# Patient Record
Sex: Female | Born: 1980 | Hispanic: No | Marital: Married | Smoking: Former smoker
Health system: Southern US, Community
[De-identification: ages and names within clinical notes are randomized; demographics above are authoritative.]

## PROBLEM LIST (undated history)

## (undated) DIAGNOSIS — K859 Acute pancreatitis without necrosis or infection, unspecified: Secondary | ICD-10-CM

## (undated) DIAGNOSIS — F1010 Alcohol abuse, uncomplicated: Secondary | ICD-10-CM

## (undated) DIAGNOSIS — I509 Heart failure, unspecified: Secondary | ICD-10-CM

## (undated) DIAGNOSIS — I429 Cardiomyopathy, unspecified: Secondary | ICD-10-CM

## (undated) DIAGNOSIS — K2970 Gastritis, unspecified, without bleeding: Secondary | ICD-10-CM

## (undated) DIAGNOSIS — K219 Gastro-esophageal reflux disease without esophagitis: Secondary | ICD-10-CM

## (undated) HISTORY — DX: Acute pancreatitis without necrosis or infection, unspecified: K85.90

## (undated) HISTORY — DX: Cardiomyopathy, unspecified: I42.9

## (undated) HISTORY — DX: Heart failure, unspecified: I50.9

## (undated) HISTORY — PX: WISDOM TOOTH EXTRACTION: SHX21

## (undated) HISTORY — DX: Gastro-esophageal reflux disease without esophagitis: K21.9

---

## 2008-04-27 ENCOUNTER — Other Ambulatory Visit: Admission: RE | Admit: 2008-04-27 | Discharge: 2008-04-27 | Payer: Self-pay | Admitting: Gynecology

## 2009-09-06 ENCOUNTER — Emergency Department (HOSPITAL_COMMUNITY): Admission: EM | Admit: 2009-09-06 | Discharge: 2009-09-07 | Payer: Self-pay | Admitting: Emergency Medicine

## 2009-09-06 ENCOUNTER — Emergency Department (HOSPITAL_COMMUNITY): Admission: EM | Admit: 2009-09-06 | Discharge: 2009-09-06 | Payer: Self-pay | Admitting: Family Medicine

## 2010-07-25 ENCOUNTER — Encounter (HOSPITAL_COMMUNITY)
Admission: RE | Admit: 2010-07-25 | Discharge: 2010-07-25 | Disposition: A | Discharge status: Home / Self Care | Payer: 59 | Source: Ambulatory Visit | Attending: Obstetrics and Gynecology | Admitting: Obstetrics and Gynecology

## 2010-07-25 ENCOUNTER — Other Ambulatory Visit (HOSPITAL_COMMUNITY): Payer: 59

## 2010-07-25 DIAGNOSIS — Z01812 Encounter for preprocedural laboratory examination: Secondary | ICD-10-CM | POA: Insufficient documentation

## 2010-07-25 LAB — CBC
HCT: 41.9 % (ref 36.0–46.0)
Hemoglobin: 14.1 g/dL (ref 12.0–15.0)
MCH: 29.7 pg (ref 26.0–34.0)
MCV: 88.2 fL (ref 78.0–100.0)
RDW: 13.1 % (ref 11.5–15.5)

## 2010-08-01 ENCOUNTER — Other Ambulatory Visit (HOSPITAL_COMMUNITY): Payer: Self-pay | Admitting: Obstetrics and Gynecology

## 2010-08-01 ENCOUNTER — Ambulatory Visit (HOSPITAL_COMMUNITY)
Admission: RE | Admit: 2010-08-01 | Discharge: 2010-08-01 | Disposition: A | Discharge status: Home / Self Care | Payer: 59 | Source: Ambulatory Visit | Attending: Obstetrics and Gynecology | Admitting: Obstetrics and Gynecology

## 2010-08-01 DIAGNOSIS — N938 Other specified abnormal uterine and vaginal bleeding: Secondary | ICD-10-CM | POA: Insufficient documentation

## 2010-08-01 DIAGNOSIS — E282 Polycystic ovarian syndrome: Secondary | ICD-10-CM | POA: Insufficient documentation

## 2010-08-01 DIAGNOSIS — N840 Polyp of corpus uteri: Secondary | ICD-10-CM | POA: Insufficient documentation

## 2010-08-01 DIAGNOSIS — N949 Unspecified condition associated with female genital organs and menstrual cycle: Secondary | ICD-10-CM | POA: Insufficient documentation

## 2010-08-01 LAB — PREGNANCY, URINE: Preg Test, Ur: NEGATIVE

## 2010-08-01 LAB — ABO/RH: ABO/RH(D): O NEG

## 2010-08-07 NOTE — Op Note (Signed)
  Tina Colon, Tina Colon               ACCOUNT NO.:  [id number]  MEDICAL RECORD NO.:  [id number]           PATIENT TYPE:  O  LOCATION:  [facility]                          FACILITY[facility]  [facility]  PHYSICIAN:  Zelphia Cairo, MD    DATE OF BIRTH:  Aug 21, 1980  DATE OF PROCEDURE:  08/01/2010 DATE OF DISCHARGE:                              OPERATIVE REPORT   PREOPERATIVE DIAGNOSES: 1. Irregular vaginal bleeding. 2. Endometrial mass.  POSTOPERATIVE DIAGNOSES: 1. Irregular vaginal bleeding. 2. Endometrial mass, path pending.  PROCEDURES: 1. Cervical block. 2. Hysteroscopy. 3. D and C.  SURGEON:  Zelphia Cairo, MD  ANESTHESIA:  General with local.  SPECIMEN:  Endometrial curettings to Pathology.  FINDINGS:  Polypoid endometrial mass noted within the endometrium.  No other masses or abnormalities were noted.  Bilateral ostia were visualized and appeared normal.  COMPLICATIONS:  None.  CONDITION:  Stable and extubated to recovery room.PROCEDURE IN DETAIL:  [name] was taken to the operating room after informed consent was obtained.  She was given anesthesia and placed in the dorsal lithotomy position using Allen stirrups.  She was prepped and draped in sterile fashion, and an in-and-out catheter was used to drain her bladder sterilely.  Bivalve speculum was placed in the vagina and 1 mL of 1% lidocaine was injected at 12 o'clock position of the cervix. Single-tooth tenaculum was then placed on the anterior lip of the cervix and a paracervical block was performed using 9 mL of 1% lidocaine.  The cervix was then serially dilated using Pratt dilators.  Diagnostic hysteroscope was inserted into the endometrial cavity.  Polypoid mass was noted.  No other abnormalities were noted.  Bilateral ostia were visualized and appeared normal.  Hysteroscope was removed and a gentle curetting was performed.  Pathology specimen was placed on Telfa and passed off.  The diagnostic hysteroscope was reinserted.   No other masses or abnormalities were noted. Polypoid mass was no longer present.  The hysteroscope was then removed. Single-tooth tenaculum was removed.  The cervix was hemostatic.  The speculum was removed.  The patient was taken to the recovery room in stable condition.  Sponge, lap, needle, and instrument counts were correct x2.     Zelphia Cairo, MD     GA/MEDQ  D:  08/01/2010  T:  08/01/2010  Job:  [id number]  Electronically Signed by Zelphia Cairo MD on 08/07/2010 02:29:01 AM

## 2010-09-02 LAB — COMPREHENSIVE METABOLIC PANEL
Albumin: 4.6 g/dL (ref 3.5–5.2)
BUN: 8 mg/dL (ref 6–23)
Chloride: 105 mEq/L (ref 96–112)
GFR calc non Af Amer: >60 mL/min (ref >60)
Potassium: 3.5 mEq/L (ref 3.5–5.1)
Total Protein: 7.7 g/dL (ref 6.0–8.3)

## 2010-09-02 LAB — URINE MICROSCOPIC-ADD ON

## 2010-09-02 LAB — DIFFERENTIAL
Eosinophils Absolute: 0 10*3/uL (ref 0.0–0.7)
Lymphocytes Relative: 15 % (ref 12–46)
Monocytes Absolute: 0.2 10*3/uL (ref 0.1–1.0)
Monocytes Relative: 2 % — ABNORMAL LOW (ref 3–12)
Neutro Abs: 8 10*3/uL — ABNORMAL HIGH (ref 1.7–7.7)

## 2010-09-02 LAB — URINALYSIS, ROUTINE W REFLEX MICROSCOPIC
Bilirubin Urine: NEGATIVE
Hgb urine dipstick: NEGATIVE
Nitrite: NEGATIVE
Protein, ur: NEGATIVE mg/dL
Urobilinogen, UA: 0.2 mg/dL (ref 0.0–1.0)

## 2010-09-02 LAB — CBC
MCHC: 33.7 g/dL (ref 30.0–36.0)
MCV: 91.4 fL (ref 78.0–100.0)
RDW: 12.7 % (ref 11.5–15.5)
WBC: 9.7 10*3/uL (ref 4.0–10.5)

## 2010-09-02 LAB — LIPASE, BLOOD: Lipase: 23 U/L (ref 11–59)

## 2010-09-02 LAB — POCT I-STAT, CHEM 8
HCT: 42 % (ref 36.0–46.0)
TCO2: 27 mmol/L (ref 0–100)

## 2013-03-13 ENCOUNTER — Encounter (HOSPITAL_COMMUNITY): Payer: Self-pay | Admitting: *Deleted

## 2013-03-13 ENCOUNTER — Emergency Department (HOSPITAL_COMMUNITY)
Admission: EM | Admit: 2013-03-13 | Discharge: 2013-03-13 | Disposition: A | Discharge status: Home / Self Care | Payer: 59 | Attending: Emergency Medicine | Admitting: Emergency Medicine

## 2013-03-13 ENCOUNTER — Encounter (HOSPITAL_COMMUNITY): Payer: Self-pay | Admitting: Family Medicine

## 2013-03-13 ENCOUNTER — Emergency Department (INDEPENDENT_AMBULATORY_CARE_PROVIDER_SITE_OTHER)
Admission: EM | Admit: 2013-03-13 | Discharge: 2013-03-13 | Disposition: A | Discharge status: Nursing Home (Includes ICF) | Payer: Self-pay | Source: Home / Self Care | Attending: Emergency Medicine | Admitting: Emergency Medicine

## 2013-03-13 DIAGNOSIS — Z880 Allergy status to penicillin: Secondary | ICD-10-CM | POA: Insufficient documentation

## 2013-03-13 DIAGNOSIS — R112 Nausea with vomiting, unspecified: Secondary | ICD-10-CM | POA: Insufficient documentation

## 2013-03-13 DIAGNOSIS — F1021 Alcohol dependence, in remission: Secondary | ICD-10-CM | POA: Insufficient documentation

## 2013-03-13 DIAGNOSIS — Z3202 Encounter for pregnancy test, result negative: Secondary | ICD-10-CM | POA: Insufficient documentation

## 2013-03-13 DIAGNOSIS — E876 Hypokalemia: Secondary | ICD-10-CM

## 2013-03-13 DIAGNOSIS — F17200 Nicotine dependence, unspecified, uncomplicated: Secondary | ICD-10-CM | POA: Insufficient documentation

## 2013-03-13 DIAGNOSIS — E860 Dehydration: Secondary | ICD-10-CM

## 2013-03-13 HISTORY — DX: Alcohol abuse, uncomplicated: F10.10

## 2013-03-13 LAB — CBC WITH DIFFERENTIAL/PLATELET
Eosinophils Relative: 1 % (ref 0–5)
HCT: 40.2 % (ref 36.0–46.0)
Hemoglobin: 14.5 g/dL (ref 12.0–15.0)
MCH: 31.3 pg (ref 26.0–34.0)
MCHC: 36.1 g/dL — ABNORMAL HIGH (ref 30.0–36.0)
MCV: 86.6 fL (ref 78.0–100.0)
Monocytes Absolute: 0.6 10*3/uL (ref 0.1–1.0)
RBC: 4.64 MIL/uL (ref 3.87–5.11)
RDW: 13.3 % (ref 11.5–15.5)
WBC: 12.3 10*3/uL — ABNORMAL HIGH (ref 4.0–10.5)

## 2013-03-13 LAB — POCT I-STAT, CHEM 8
Calcium, Ion: 1.13 mmol/L (ref 1.12–1.23)
Chloride: 106 mEq/L (ref 96–112)
Potassium: 3.2 mEq/L — ABNORMAL LOW (ref 3.5–5.1)

## 2013-03-13 LAB — PREGNANCY, URINE: Preg Test, Ur: NEGATIVE

## 2013-03-13 MED ORDER — ONDANSETRON HCL 4 MG/2ML IJ SOLN
4.0000 mg | Freq: Once | INTRAMUSCULAR | Status: AC
  Administered 2013-03-13: 4 mg via INTRAVENOUS
  Filled 2013-03-13: qty 2

## 2013-03-13 MED ORDER — ONDANSETRON HCL 4 MG/2ML IJ SOLN
INTRAMUSCULAR | Status: AC
  Filled 2013-03-13: qty 2

## 2013-03-13 MED ORDER — KETOROLAC TROMETHAMINE 30 MG/ML IJ SOLN
30.0000 mg | Freq: Once | INTRAMUSCULAR | Status: AC
Start: 2013-03-13 — End: 2013-03-13
  Administered 2013-03-13: 30 mg via INTRAVENOUS
  Filled 2013-03-13: qty 1

## 2013-03-13 MED ORDER — PROMETHAZINE HCL 25 MG PO TABS: 25.0000 mg | ORAL_TABLET | Freq: Four times a day (QID) | ORAL | Status: DC | PRN

## 2013-03-13 MED ORDER — PROMETHAZINE HCL 25 MG/ML IJ SOLN
25.0000 mg | Freq: Once | INTRAMUSCULAR | Status: AC
  Administered 2013-03-13: 25 mg via INTRAVENOUS
  Filled 2013-03-13: qty 1

## 2013-03-13 MED ORDER — ONDANSETRON HCL 4 MG/2ML IJ SOLN: 4.0000 mg | Freq: Once | INTRAMUSCULAR | Status: DC

## 2013-03-13 MED ORDER — SODIUM CHLORIDE 0.9 % IV SOLN
Freq: Once | INTRAVENOUS | Status: AC
  Administered 2013-03-13: 17:00:00 via INTRAVENOUS

## 2013-03-13 MED ORDER — SODIUM CHLORIDE 0.9 % IV BOLUS (SEPSIS)
1000.0000 mL | Freq: Once | INTRAVENOUS | Status: AC
  Administered 2013-03-13: 1000 mL via INTRAVENOUS

## 2013-03-13 MED ORDER — ONDANSETRON 4 MG PO TBDP
4.0000 mg | ORAL_TABLET | Freq: Once | ORAL | Status: AC
  Administered 2013-03-13: 4 mg via ORAL
  Filled 2013-03-13: qty 1

## 2013-03-13 MED ORDER — ONDANSETRON HCL 4 MG/2ML IJ SOLN
4.0000 mg | Freq: Once | INTRAMUSCULAR | Status: AC
  Administered 2013-03-13: 4 mg via INTRAVENOUS

## 2013-03-13 NOTE — ED Notes (Signed)
Pt  Reports      Vomiting     abd  Earlier       No  Diarrhea        Symptoms  Began this  Am  Feels  like  Has  A  bm     But  Cannot    Pain  Started out  As  Cramping

## 2013-03-13 NOTE — ED Notes (Signed)
Bolus from urgent care complete.

## 2013-03-13 NOTE — ED Provider Notes (Signed)
Chief Complaint:   Chief Complaint  Patient presents with  . Emesis    History of Present Illness:   Tina Colon is a 32 year old female who presents with a history of one to 2 hours of nausea and vomiting. Her vomitus is yellow. She denies any hematemesis or coffee-ground emesis. No bilious emesis. She feels chilled but does not have a fever. She has some crampy lower bowel pain which she attributes to menstrual cramps. She denies any diarrhea or blood in the stool. She has had no history of GI problems including ulcers, gastritis, gallstones, or pancreatitis. She does not think she is pregnant. Her last menstrual period was mid September. She states she drank 1 and 1/2 beers last night, yet I can detect a faint odor of alcohol on her breath right now.   Review of Systems:  Other than noted above, the patient denies any of the following symptoms: Systemic:  No fevers, chills, sweats, weight loss or gain, fatigue, or tiredness. ENT:  No nasal congestion, rhinorrhea, or sore throat. Lungs:  No cough, wheezing, or shortness of breath. Cardiac:  No chest pain, syncope, or presyncope. GI:  No abdominal pain, nausea, vomiting, anorexia, diarrhea, constipation, blood in stool or vomitus. GU:  No dysuria, frequency, or urgency.  PMFSH:  Past medical history, family history, social history, meds, and allergies were reviewed.  She is allergic to penicillin.  Physical Exam:   Vital signs:  BP 122/75  Pulse 79  Temp(Src) 98.5 F (36.9 C) (Oral)  Resp 18  SpO2 100%  LMP 02/21/2013 Filed Vitals:   03/13/13 1555 03/13/13 1556 Supine  03/13/13 1559 Sitting  03/13/13 1601 Standing   BP: 144/78 146/67 153/89 122/75  Pulse: 55 55 61 79  Temp: 98.5 F (36.9 C)     TempSrc: Oral     Resp: 18     SpO2: 100%      General:  Alert and oriented.  She is in marked distress with constant retching and groaning.  Skin warm and dry.  Good skin turgor, brisk capillary refill. ENT:  No scleral icterus,  moist mucous membranes, no oral lesions, pharynx clear. Has mild odor of alcohol on her breath. Lungs:  Breath sounds clear and equal bilaterally.  No wheezes, rales, or rhonchi. Heart:  Rhythm regular, without extrasystoles.  No gallops or murmers. Abdomen:  Soft, flat, nondistended. She has mild, lower abdominal tenderness to palpation in both lower quadrants without guarding or rebound. No organomegaly or mass. Bowel sounds are not heard. Skin: Clear, warm, and dry.  Good turgor.  Brisk capillary refill.  Labs:   Results for orders placed during the hospital encounter of 03/13/13  CBC WITH DIFFERENTIAL      Result Value Range   WBC 12.3 (*) 4.0 - 10.5 K/uL   RBC 4.64  3.87 - 5.11 MIL/uL   Hemoglobin 14.5  12.0 - 15.0 g/dL   HCT 16.1  09.6 - 04.5 %   MCV 86.6  78.0 - 100.0 fL   MCH 31.3  26.0 - 34.0 pg   MCHC 36.1 (*) 30.0 - 36.0 g/dL   RDW 40.9  81.1 - 91.4 %   Platelets 220  150 - 400 K/uL   Neutrophils Relative % 80 (*) 43 - 77 %   Neutro Abs 9.8 (*) 1.7 - 7.7 K/uL   Lymphocytes Relative 14  12 - 46 %   Lymphs Abs 1.8  0.7 - 4.0 K/uL   Monocytes Relative 5  3 -  12 %   Monocytes Absolute 0.6  0.1 - 1.0 K/uL   Eosinophils Relative 1  0 - 5 %   Eosinophils Absolute 0.1  0.0 - 0.7 K/uL   Basophils Relative 0  0 - 1 %   Basophils Absolute 0.0  0.0 - 0.1 K/uL  POCT I-STAT, CHEM 8      Result Value Range   Sodium 143  135 - 145 mEq/L   Potassium 3.2 (*) 3.5 - 5.1 mEq/L   Chloride 106  96 - 112 mEq/L   BUN 9  6 - 23 mg/dL   Creatinine, Ser 1.61  0.50 - 1.10 mg/dL   Glucose, Bld 096 (*) 70 - 99 mg/dL   Calcium, Ion 0.45  4.09 - 1.23 mmol/L   TCO2 22  0 - 100 mmol/L   Hemoglobin 15.0  12.0 - 15.0 g/dL   HCT 81.1  91.4 - 78.2 %     Course in Urgent Care Center:   Begin on IV normal saline and given Zofran 4 mg IV without any improvement in her symptoms. She continued to have retching and vomiting after IV fluids and IV Zofran.  Assessment:  The primary encounter diagnosis was  Nausea and vomiting. Diagnoses of Dehydration and Hypokalemia were also pertinent to this visit.  Differential diagnosis includes viral gastroenteritis, pregnancy, gastritis, ulcer, pancreatitis, appendicitis, or cholecystitis. She will need further workup.  Plan:   The patient was transferred to the ED via [facility] in stable condition.  Medical Decision Making:  32 year old female presents with history of 1 to 2 hours of nausea and vomiting.  Vomitus is yellow.  Feels chilled.  She has some crampy, lower abdominal abdominal pain which she attributes to menstrual cramps.  Denies any fever or diarrhea.  No urinary Sx.  LMP was Sept. 15.  Unable to get urine for preg test.  Patient states she had 1 and 1/2 beer last night, although has alcohol on breath tonight.  Postural vital signs show orthostatic change.  On iStat8 shows K of 3.2.  A CBC is pending.  We have given IV NS and Zofran 4 mg IV, but symptoms continue unabated with intractable nausea, vomiting, and constant retching.  Needs further workup, IV fluids, meds, etc.  Addendum:  WBC is elevated at 12,300.        Reuben Likes, MD 03/13/13 [phone]

## 2013-03-13 NOTE — ED Notes (Signed)
Pt in restroom vomiting.  Pt safely escorted back to E40 via wheelchair.  Husband notified of delay in d/c via voicemail on cell phone.

## 2013-03-13 NOTE — ED Notes (Signed)
Veatrice Kells NP notified of change in pt's condition.

## 2013-03-13 NOTE — ED Notes (Signed)
Patient vomiting loudly. Small amount of brown emesis. Cold wash cloth given to patient. Dr Juleen China notified.

## 2013-03-13 NOTE — ED Notes (Signed)
Pt brought by [facility] from [facility]. Pt is dry heaving/vomiting intermittently. 1L bolus is infusing at this time.

## 2013-03-13 NOTE — ED Notes (Addendum)
Pt able to pass PO challenge. Ready for d/c home with husband

## 2013-03-13 NOTE — ED Notes (Signed)
Dr Juleen China  At bedside.

## 2013-03-13 NOTE — ED Notes (Signed)
Pt placed on cardiac monitor and supplemental O2 by [place].

## 2013-03-15 ENCOUNTER — Emergency Department (HOSPITAL_COMMUNITY)
Admission: EM | Admit: 2013-03-15 | Discharge: 2013-03-16 | Disposition: A | Discharge status: Home / Self Care | Payer: Self-pay | Attending: Emergency Medicine | Admitting: Emergency Medicine

## 2013-03-15 ENCOUNTER — Encounter (HOSPITAL_COMMUNITY): Payer: Self-pay | Admitting: Emergency Medicine

## 2013-03-15 ENCOUNTER — Emergency Department (HOSPITAL_COMMUNITY): Payer: Self-pay

## 2013-03-15 DIAGNOSIS — K2970 Gastritis, unspecified, without bleeding: Secondary | ICD-10-CM | POA: Insufficient documentation

## 2013-03-15 DIAGNOSIS — Z3202 Encounter for pregnancy test, result negative: Secondary | ICD-10-CM | POA: Insufficient documentation

## 2013-03-15 DIAGNOSIS — R109 Unspecified abdominal pain: Secondary | ICD-10-CM

## 2013-03-15 DIAGNOSIS — Z880 Allergy status to penicillin: Secondary | ICD-10-CM | POA: Insufficient documentation

## 2013-03-15 DIAGNOSIS — R1110 Vomiting, unspecified: Secondary | ICD-10-CM

## 2013-03-15 DIAGNOSIS — F17200 Nicotine dependence, unspecified, uncomplicated: Secondary | ICD-10-CM | POA: Insufficient documentation

## 2013-03-15 LAB — COMPREHENSIVE METABOLIC PANEL
ALT: 19 U/L (ref 0–35)
Albumin: 4.8 g/dL (ref 3.5–5.2)
Alkaline Phosphatase: 61 U/L (ref 39–117)
BUN: 11 mg/dL (ref 6–23)
Calcium: 9.7 mg/dL (ref 8.4–10.5)
Creatinine, Ser: 0.75 mg/dL (ref 0.50–1.10)
GFR calc Af Amer: >90 mL/min (ref >90)
Glucose, Bld: 120 mg/dL — ABNORMAL HIGH (ref 70–99)
Potassium: 3.4 mEq/L — ABNORMAL LOW (ref 3.5–5.1)
Sodium: 142 mEq/L (ref 135–145)
Total Protein: 8.4 g/dL — ABNORMAL HIGH (ref 6.0–8.3)

## 2013-03-15 LAB — CBC WITH DIFFERENTIAL/PLATELET
Eosinophils Absolute: 0 10*3/uL (ref 0.0–0.7)
Lymphocytes Relative: 10 % — ABNORMAL LOW (ref 12–46)
Lymphs Abs: 0.9 10*3/uL (ref 0.7–4.0)
MCH: 30.3 pg (ref 26.0–34.0)
MCHC: 35.7 g/dL (ref 30.0–36.0)
MCV: 84.9 fL (ref 78.0–100.0)
Monocytes Absolute: 0.2 10*3/uL (ref 0.1–1.0)
Monocytes Relative: 2 % — ABNORMAL LOW (ref 3–12)
Neutro Abs: 7.6 10*3/uL (ref 1.7–7.7)
Platelets: 252 10*3/uL (ref 150–400)
RDW: 13 % (ref 11.5–15.5)
WBC: 8.7 10*3/uL (ref 4.0–10.5)

## 2013-03-15 LAB — POCT PREGNANCY, URINE: Preg Test, Ur: NEGATIVE

## 2013-03-15 LAB — LIPASE, BLOOD: Lipase: 23 U/L (ref 11–59)

## 2013-03-15 MED ORDER — GI COCKTAIL ~~LOC~~
30.0000 mL | Freq: Once | ORAL | Status: AC
  Administered 2013-03-15: 30 mL via ORAL
  Filled 2013-03-15: qty 30

## 2013-03-15 MED ORDER — SODIUM CHLORIDE 0.9 % IV BOLUS (SEPSIS)
1000.0000 mL | Freq: Once | INTRAVENOUS | Status: AC
  Administered 2013-03-15: 1000 mL via INTRAVENOUS

## 2013-03-15 MED ORDER — ONDANSETRON 4 MG PO TBDP
8.0000 mg | ORAL_TABLET | Freq: Once | ORAL | Status: AC
  Administered 2013-03-15: 8 mg via ORAL
  Filled 2013-03-15: qty 2

## 2013-03-15 NOTE — ED Provider Notes (Signed)
32 year old female who has a history of fairly heavy alcohol intake mostly on the weekends who presents with a second visit for nausea and vomiting with epigastric pain over the last 3 days. She states that she drank heavily on Saturday, on Sunday she was nauseated and vomiting, seen in the emergency department without any acute findings to suggest a surgical source and was discharged home. Monday was a good day, today she woke up with significant recurrent epigastric pain nausea and vomiting. She has had multiple episodes of vomiting, on my exam she has a soft abdomen which is diffusely minimally tender but has increased pain in the epigastrium. She does not have a Murphy sign, there is no pain at McBurney's point, she has clear heart and lung sounds with moist mucous membranes. 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 We'll start a GI cocktail, Pepcid, Protonix.  Ultrasound negative, labs unremarkable, patient became essentially symptom-free after getting medications above, I suspect this is related to her alcoholic gastritis or other significant acid-related disease. This is not preventing her from taking oral fluids or food and she has been given antiemetics at discharge along with antacid medications.  Medical screening examination/treatment/procedure(s) were conducted as a shared visit with non-physician practitioner(s) and myself.  I personally evaluated the patient during the encounter.  Clinical Impression: Gastritis        Vida Roller, MD 03/16/13 1616

## 2013-03-15 NOTE — ED Notes (Signed)
Pt c/o N/V on Sunday that then improved yesterday and returned today

## 2013-03-15 NOTE — ED Provider Notes (Signed)
CSN: [id number]     Arrival date & time 03/15/13  1741 History   First MD Initiated Contact with Patient 03/15/13 2121     Chief Complaint  Patient presents with  . Emesis   (Consider location/radiation/quality/duration/timing/severity/associated sxs/prior Treatment) HPI Comments: The patient presents to the ED with abdominal pain and vomiting.  She describes abdominal discomfort as cramping and 7/10. She reports current episode started around 1300 and had several bilious episodes of vomiting.  She reports one episode of vomiting with bright red blood in the ED.  The patient states she is a heavy drinker on the weekends but has not had any ETOH since Saturday when the initial episode began prior to presenting to the ED on 03/13/2013. She denies diarrhea or constipation. The patient reported to the ED 03/13/2013 for a similar episode of vomiting and abdominal pain, had relief from the promethazine prescribed but reports vomited it up today.  LNMP: 03/13/2013.  Patient is a 32 y.o. female presenting with vomiting. The history is provided by the patient.  Emesis    Past Medical History  Diagnosis Date  . ETOH abuse    History reviewed. No pertinent past surgical history. History reviewed. No pertinent family history. History  Substance Use Topics  . Smoking status: Current Every Day Smoker  . Smokeless tobacco: Not on file  . Alcohol Use: Yes   OB History   Grav Para Term Preterm Abortions TAB SAB Ect Mult Living                 Review of Systems  Gastrointestinal: Positive for vomiting.  All other systems reviewed and are negative.    Allergies  Penicillins  Home Medications   Current Outpatient Rx  Name  Route  Sig  Dispense  Refill  . promethazine (PHENERGAN) 25 MG tablet   Oral   Take 1 tablet (25 mg total) by mouth every 6 (six) hours as needed for nausea.   12 tablet   0    BP 119/66  Pulse 54  Temp(Src) 98.5 F (36.9 C) (Oral)  Resp 18  SpO2 100%  LMP  03/13/2013 Physical Exam  Nursing note and vitals reviewed. Constitutional: She appears well-developed and well-nourished.  HENT:  Head: Normocephalic and atraumatic.  Eyes: No scleral icterus.  Neck: Neck supple.  Cardiovascular: Normal rate, regular rhythm and normal heart sounds.   Pulmonary/Chest: Effort normal and breath sounds normal.  Abdominal: Soft. Normal appearance and bowel sounds are normal. She exhibits no distension and no mass. There is tenderness in the epigastric area, suprapubic area and left lower quadrant. There is no rigidity, no rebound, no guarding and negative Murphy's sign.    Neurological: She is alert.  Skin: Skin is warm. No rash noted.    ED Course  Procedures (including critical care time)   Results for orders placed during the hospital encounter of 03/15/13  CBC WITH DIFFERENTIAL      Result Value Range   WBC 8.7  4.0 - 10.5 K/uL   RBC 4.85  3.87 - 5.11 MIL/uL   Hemoglobin 14.7  12.0 - 15.0 g/dL   HCT 78.2  95.6 - 21.3 %   MCV 84.9  78.0 - 100.0 fL   MCH 30.3  26.0 - 34.0 pg   MCHC 35.7  30.0 - 36.0 g/dL   RDW 08.6  57.8 - 46.9 %   Platelets 252  150 - 400 K/uL   Neutrophils Relative % 88 (*) 43 - 77 %  Lymphocytes Relative 10 (*) 12 - 46 %   Monocytes Relative 2 (*) 3 - 12 %   Eosinophils Relative 0  0 - 5 %   Basophils Relative 0  0 - 1 %   WBC Morphology TOXIC GRANULATION     Smear Review LARGE PLATELETS PRESENT     Neutro Abs 7.6  1.7 - 7.7 K/uL   Lymphs Abs 0.9  0.7 - 4.0 K/uL   Monocytes Absolute 0.2  0.1 - 1.0 K/uL   Eosinophils Absolute 0.0  0.0 - 0.7 K/uL   Basophils Absolute 0.0  0.0 - 0.1 K/uL  COMPREHENSIVE METABOLIC PANEL      Result Value Range   Sodium 142  135 - 145 mEq/L   Potassium 3.4 (*) 3.5 - 5.1 mEq/L   Chloride 103  96 - 112 mEq/L   CO2 22  19 - 32 mEq/L   Glucose, Bld 120 (*) 70 - 99 mg/dL   BUN 11  6 - 23 mg/dL   Creatinine, Ser 7.82  0.50 - 1.10 mg/dL   Calcium 9.7  8.4 - 95.6 mg/dL   Total Protein 8.4  (*) 6.0 - 8.3 g/dL   Albumin 4.8  3.5 - 5.2 g/dL   AST 17  0 - 37 U/L   ALT 19  0 - 35 U/L   Alkaline Phosphatase 61  39 - 117 U/L   Total Bilirubin 0.3  0.3 - 1.2 mg/dL   GFR calc non Af Amer >90  >90 mL/min   GFR calc Af Amer >90  >90 mL/min  LIPASE, BLOOD      Result Value Range   Lipase 23  11 - 59 U/L  URINALYSIS, ROUTINE W REFLEX MICROSCOPIC      Result Value Range   Color, Urine YELLOW  YELLOW   APPearance CLOUDY (*) CLEAR   Specific Gravity, Urine 1.023  1.005 - 1.030   pH 8.5 (*) 5.0 - 8.0   Glucose, UA NEGATIVE  NEGATIVE mg/dL   Hgb urine dipstick MODERATE (*) NEGATIVE   Bilirubin Urine NEGATIVE  NEGATIVE   Ketones, ur 15 (*) NEGATIVE mg/dL   Protein, ur 30 (*) NEGATIVE mg/dL   Urobilinogen, UA 1.0  0.0 - 1.0 mg/dL   Nitrite NEGATIVE  NEGATIVE   Leukocytes, UA NEGATIVE  NEGATIVE  URINE MICROSCOPIC-ADD ON      Result Value Range   Squamous Epithelial / LPF FEW (*) RARE   RBC / HPF 3-6  <3 RBC/hpf   Bacteria, UA MANY (*) RARE  POCT PREGNANCY, URINE      Result Value Range   Preg Test, Ur NEGATIVE  NEGATIVE   US Abdomen Complete  03/16/2013   *RADIOLOGY REPORT*  Clinical Data:  Right upper quadrant abdominal pain and vomiting.  ABDOMINAL ULTRASOUND COMPLETE  Comparison:  Abdominal ultrasound performed 09/06/2009, and CT of the abdomen and pelvis performed 09/07/2009  Findings:  Gallbladder:  The gallbladder is normal in appearance, without evidence for gallstones, gallbladder wall thickening or pericholecystic fluid.  No ultrasonographic Murphy's sign is elicited.  Common Bile Duct:  0.5 cm in diameter; within normal limits in caliber.  Liver:  Normal parenchymal echogenicity and echotexture; no focal lesions identified.  Limited Doppler evaluation demonstrates normal blood flow within the liver.  IVC:  Unremarkable in appearance.  Pancreas:  Although the pancreas is difficult to visualize in its entirety due to overlying bowel gas, no focal pancreatic abnormality is  identified.  Spleen:  10.6 cm in length; within  normal limits in size and echotexture.  Right kidney:  11.1 cm in length; normal in size, configuration and parenchymal echogenicity.  No evidence of mass or hydronephrosis.  Left kidney:  10.5 cm in length; normal in size, configuration and parenchymal echogenicity.  No evidence of mass or hydronephrosis.  Abdominal Aorta:  Normal in caliber; no aneurysm identified.  IMPRESSION: Unremarkable abdominal ultrasound.   Original Report Authenticated By: Tonia Ghent, M.D.   MDM   1. Vomiting   2. Abdominal pain     0014 Re-eval: Patient states nausea and pain is better. Discussed test results treatment plan with patient.  Patient agrees to follow up with GI as an out patient.  Patient discharged with Nexium, Zofran, Phenergan.  Clabe Seal, PA-C 03/19/13 [phone]

## 2013-03-16 LAB — URINE MICROSCOPIC-ADD ON

## 2013-03-16 LAB — URINALYSIS, ROUTINE W REFLEX MICROSCOPIC
Leukocytes, UA: NEGATIVE
Nitrite: NEGATIVE
Specific Gravity, Urine: 1.023 (ref 1.005–1.030)
Urobilinogen, UA: 1 mg/dL (ref 0.0–1.0)
pH: 8.5 — ABNORMAL HIGH (ref 5.0–8.0)

## 2013-03-16 NOTE — ED Notes (Signed)
See paper charting for this pat as pt was discharged during system downtime 

## 2013-03-19 NOTE — ED Provider Notes (Signed)
Medical screening examination/treatment/procedure(s) were conducted as a shared visit with non-physician practitioner(s) and myself.  I personally evaluated the patient during the encounter  Please see my separate respective documentation pertaining to this patient encounter   Vida Roller, MD 03/19/13 [phone]

## 2013-03-21 NOTE — ED Provider Notes (Signed)
CSN: [id number]     Arrival date & time 03/13/13  1739 History   First MD Initiated Contact with Patient 03/13/13 1805     Chief Complaint  Patient presents with  . Abdominal Pain   (Consider location/radiation/quality/duration/timing/severity/associated sxs/prior Treatment) HPI  32 year old female with nausea and vomiting. Onset earlier today. Multiple episodes of nonbilious, nonbloody emesis. Abdominal pain is diffuse. She cannot remember if the vomiting or the abdominal pain started first. No fevers or chills. No diarrhea. No sick contacts. Patient does not think she is pregnant. No urinary complaints. Admits to heavy alcohol use last night.  Past Medical History  Diagnosis Date  . ETOH abuse    History reviewed. No pertinent past surgical history. No family history on file. History  Substance Use Topics  . Smoking status: Current Every Day Smoker  . Smokeless tobacco: Not on file  . Alcohol Use: Yes   OB History   Grav Para Term Preterm Abortions TAB SAB Ect Mult Living                 Review of Systems  All systems reviewed and negative, other than as noted in HPI.   Allergies  Penicillins  Home Medications   Current Outpatient Rx  Name  Route  Sig  Dispense  Refill  . promethazine (PHENERGAN) 25 MG tablet   Oral   Take 1 tablet (25 mg total) by mouth every 6 (six) hours as needed for nausea.   12 tablet   0    BP 133/88  Pulse 102  Temp(Src) 99.1 F (37.3 C) (Oral)  Resp 18  SpO2 98%  LMP 03/13/2013 Physical Exam  Nursing note and vitals reviewed. Constitutional: She appears well-developed and well-nourished. No distress.  HENT:  Head: Normocephalic and atraumatic.  Eyes: Conjunctivae are normal. Right eye exhibits no discharge. Left eye exhibits no discharge.  Neck: Neck supple.  Cardiovascular: Normal rate, regular rhythm and normal heart sounds.  Exam reveals no gallop and no friction rub.   No murmur heard. Pulmonary/Chest: Effort normal and  breath sounds normal. No respiratory distress.  Abdominal: Soft. She exhibits no distension. There is no tenderness.  Musculoskeletal: She exhibits no edema and no tenderness.  Neurological: She is alert.  Skin: Skin is warm and dry.  Psychiatric: She has a normal mood and affect. Her behavior is normal. Thought content normal.    ED Course  Procedures (including critical care time) Labs Review Labs Reviewed  PREGNANCY, URINE   Imaging Review No results found.  EKG Interpretation   None       MDM   1. Nausea and vomiting    32 year old female with nausea and vomiting. Benign abdominal exam. Suspect related to heavy alcohol use last night. Labs from urgent care were reviewed. Patient was given additional IV fluids and antiemetics. Very low suspicion for emergent process. Patient was counseled on this possible alcohol use. Return precautions were discussed. Prescription for when necessary anti-medics provided.    Raeford Razor, MD 03/21/13 [phone]

## 2013-11-29 ENCOUNTER — Ambulatory Visit (INDEPENDENT_AMBULATORY_CARE_PROVIDER_SITE_OTHER): Payer: Self-pay | Admitting: Family Medicine

## 2013-11-29 VITALS — BP 128/74 | HR 104 | Temp 98.7°F | Resp 18 | Ht 62.0 in | Wt 199.0 lb

## 2013-11-29 DIAGNOSIS — R350 Frequency of micturition: Secondary | ICD-10-CM

## 2013-11-29 DIAGNOSIS — R109 Unspecified abdominal pain: Secondary | ICD-10-CM

## 2013-11-29 DIAGNOSIS — R1030 Lower abdominal pain, unspecified: Secondary | ICD-10-CM

## 2013-11-29 LAB — POCT URINALYSIS DIPSTICK
Bilirubin, UA: NEGATIVE
Blood, UA: NEGATIVE
Glucose, UA: NEGATIVE
Ketones, UA: NEGATIVE
Leukocytes, UA: NEGATIVE
Nitrite, UA: NEGATIVE
Protein, UA: NEGATIVE
Spec Grav, UA: 1.015
Urobilinogen, UA: 1
pH, UA: 6

## 2013-11-29 LAB — POCT UA - MICROSCOPIC ONLY
Bacteria, U Microscopic: NEGATIVE
Casts, Ur, LPF, POC: NEGATIVE
Crystals, Ur, HPF, POC: NEGATIVE
Mucus, UA: NEGATIVE
RBC, urine, microscopic: NEGATIVE
Yeast, UA: NEGATIVE

## 2013-11-29 LAB — POCT CBC
Granulocyte percent: 76.7 %G (ref 37–80)
HCT, POC: 44.6 % (ref 37.7–47.9)
Hemoglobin: 14.3 g/dL (ref 12.2–16.2)
Lymph, poc: 2.5 (ref 0.6–3.4)
MCH, POC: 29.6 pg (ref 27–31.2)
MCHC: 32.1 g/dL (ref 31.8–35.4)
MCV: 92.3 fL (ref 80–97)
MID (cbc): 0.7 (ref 0–0.9)
MPV: 10.7 fL (ref 0–99.8)
POC Granulocyte: 10.6 — AB (ref 2–6.9)
POC LYMPH PERCENT: 18 %L (ref 10–50)
POC MID %: 5.3 %M (ref 0–12)
Platelet Count, POC: 262 10*3/uL (ref 142–424)
RBC: 4.83 M/uL (ref 4.04–5.48)
RDW, POC: 14.4 %
WBC: 13.8 10*3/uL — AB (ref 4.6–10.2)

## 2013-11-29 LAB — POCT WET PREP WITH KOH
Clue Cells Wet Prep HPF POC: POSITIVE
KOH Prep POC: NEGATIVE
Trichomonas, UA: NEGATIVE
Yeast Wet Prep HPF POC: NEGATIVE

## 2013-11-29 LAB — HIV ANTIBODY (ROUTINE TESTING W REFLEX): HIV 1&2 Ab, 4th Generation: NONREACTIVE

## 2013-11-29 MED ORDER — CIPROFLOXACIN HCL 500 MG PO TABS
500.0000 mg | ORAL_TABLET | Freq: Two times a day (BID) | ORAL | Status: DC
Start: 2013-11-29 — End: 2015-05-28

## 2013-11-29 MED ORDER — METRONIDAZOLE 500 MG PO TABS: 500.0000 mg | ORAL_TABLET | Freq: Three times a day (TID) | ORAL | Status: DC

## 2013-11-29 NOTE — Patient Instructions (Signed)
This illness suggest that you do have a pelvic infection. Laboratory results will be back in 48 hours and will be able to confirm this at that time. The urine looks relatively clear. Like you to come back on Thursday or Friday to be rechecked.

## 2013-11-29 NOTE — Progress Notes (Signed)
This 33 year old woman, married and monogamous, who comes in with 24 hours of lower back pain and urinary frequency. Works at [employer]. She's off today.  Her last pelvic was last month with Dr. Chevis PrettyMezer and this was normal.  Her last menstrual period was one week ago.  Patient's having no fever but she notes a mild discharge.  Objective: Patient moves cautiously around the room but is in no acute distress HEENT: Unremarkable Neck: Supple no adenopathy Chest: Clear Heart: Regular no murmur CVA is minimally tender bilaterally Abdomen is soft but patient exhibits guarding and some rebound. She is most tender in the lower abdomen just above the pubic ramus and on the left lower quadrant. No masses are palpated and there is no hepatosplenomegaly. Pelvic exam reveals normal external genitalia, scant yellow mucus purulent discharge at the cervical os which is nulliparous and she has no cervical motion tenderness Bimanual exam reveals mild tenderness but again in the suprapubic region and more significant tenderness in the left lower quadrant with no adnexal swelling or mass.  Results for orders placed in visit on 11/29/13  POCT URINALYSIS DIPSTICK      Result Value Ref Range   Color, UA yellow     Clarity, UA clear     Glucose, UA neg     Bilirubin, UA neg     Ketones, UA neg     Spec Grav, UA 1.015     Blood, UA neg     pH, UA 6.0     Protein, UA neg     Urobilinogen, UA 1.0     Nitrite, UA neg     Leukocytes, UA Negative    POCT UA - MICROSCOPIC ONLY      Result Value Ref Range   WBC, Ur, HPF, POC 0-1     RBC, urine, microscopic neg     Bacteria, U Microscopic neg     Mucus, UA neg     Epithelial cells, urine per micros 0-2     Crystals, Ur, HPF, POC neg     Casts, Ur, LPF, POC neg     Yeast, UA neg    POCT CBC      Result Value Ref Range   WBC 13.8 (*) 4.6 - 10.2 K/uL   Lymph, poc 2.5  0.6 - 3.4   POC LYMPH PERCENT 18.0  10 - 50 %L   MID (cbc) 0.7  0 - 0.9   POC MID % 5.3  0  - 12 %M   POC Granulocyte 10.6 (*) 2 - 6.9   Granulocyte percent 76.7  37 - 80 %G   RBC 4.83  4.04 - 5.48 M/uL   Hemoglobin 14.3  12.2 - 16.2 g/dL   HCT, POC 16.144.6  09.637.7 - 47.9 %   MCV 92.3  80 - 97 fL   MCH, POC 29.6  27 - 31.2 pg   MCHC 32.1  31.8 - 35.4 g/dL   RDW, POC 04.514.4     Platelet Count, POC 262  142 - 424 K/uL   MPV 10.7  0 - 99.8 fL  POCT WET PREP WITH KOH      Result Value Ref Range   Trichomonas, UA Negative     Clue Cells Wet Prep HPF POC pos 1-5     Epithelial Wet Prep HPF POC 1-8     Yeast Wet Prep HPF POC neg     Bacteria Wet Prep HPF POC 1+     RBC Wet  Prep HPF POC 0-3     WBC Wet Prep HPF POC 10-20     KOH Prep POC Negative     Assessment:  This illness is more the characteristics of a PID. Urine looks fairly clean the white count is elevated and there is no mucopurulent discharge.  Plan: Cipro and Flagyl  Follow up in 48 hours Urinary frequency - Plan: POCT urinalysis dipstick, POCT UA - Microscopic Only, Urine culture, POCT Wet Prep with KOH  Lower abdominal pain - Plan: Urine culture, POCT CBC, GC/Chlamydia Probe Amp, HIV antibody (with reflex), POCT Wet Prep with KOH, ciprofloxacin (CIPRO) 500 MG tablet, metroNIDAZOLE (FLAGYL) 500 MG tablet    Signed, Elvina Sidle

## 2013-11-29 NOTE — Progress Notes (Signed)
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  No dysuria, but patient has frequency.  No hematuria  Slight discharge  Married, monogamous. Last pelvic with Dr Chevis Pretty was last month and was last month LMP:  Last week 5 days ago Nulliparous  Objective:  NAD Minimal CVA tenderness Abdomen:  Soft, diffusely tender lower and left lower quadrant abdomen with guarding and rebound present

## 2013-11-30 LAB — GC/CHLAMYDIA PROBE AMP
CT Probe RNA: NEGATIVE
GC Probe RNA: NEGATIVE

## 2013-11-30 LAB — URINE CULTURE: Colony Count: 3000

## 2014-10-31 ENCOUNTER — Other Ambulatory Visit: Payer: Self-pay | Admitting: Gynecology

## 2014-11-02 LAB — CYTOLOGY - PAP

## 2014-11-05 ENCOUNTER — Encounter (HOSPITAL_COMMUNITY): Payer: Self-pay

## 2014-11-05 ENCOUNTER — Emergency Department (HOSPITAL_COMMUNITY)
Admission: EM | Admit: 2014-11-05 | Discharge: 2014-11-05 | Disposition: A | Discharge status: Home / Self Care | Payer: BLUE CROSS/BLUE SHIELD | Attending: Emergency Medicine | Admitting: Emergency Medicine

## 2014-11-05 DIAGNOSIS — K2970 Gastritis, unspecified, without bleeding: Secondary | ICD-10-CM | POA: Diagnosis not present

## 2014-11-05 DIAGNOSIS — Z792 Long term (current) use of antibiotics: Secondary | ICD-10-CM | POA: Diagnosis not present

## 2014-11-05 DIAGNOSIS — Z880 Allergy status to penicillin: Secondary | ICD-10-CM | POA: Insufficient documentation

## 2014-11-05 DIAGNOSIS — R112 Nausea with vomiting, unspecified: Secondary | ICD-10-CM | POA: Diagnosis present

## 2014-11-05 DIAGNOSIS — Z87891 Personal history of nicotine dependence: Secondary | ICD-10-CM | POA: Insufficient documentation

## 2014-11-05 DIAGNOSIS — Z3202 Encounter for pregnancy test, result negative: Secondary | ICD-10-CM | POA: Diagnosis not present

## 2014-11-05 HISTORY — DX: Gastritis, unspecified, without bleeding: K29.70

## 2014-11-05 LAB — URINALYSIS, ROUTINE W REFLEX MICROSCOPIC
Bilirubin Urine: NEGATIVE
Glucose, UA: NEGATIVE mg/dL
Ketones, ur: >80 mg/dL — AB
Nitrite: NEGATIVE
PROTEIN: NEGATIVE mg/dL
Specific Gravity, Urine: 1.018 (ref 1.005–1.030)
UROBILINOGEN UA: 0.2 mg/dL (ref 0.0–1.0)
pH: 6.5 (ref 5.0–8.0)

## 2014-11-05 LAB — CBC WITH DIFFERENTIAL/PLATELET
BASOS PCT: 0 % (ref 0–1)
Basophils Absolute: 0 10*3/uL (ref 0.0–0.1)
Eosinophils Absolute: 0 10*3/uL (ref 0.0–0.7)
Eosinophils Relative: 0 % (ref 0–5)
HEMATOCRIT: 40.3 % (ref 36.0–46.0)
Hemoglobin: 13.9 g/dL (ref 12.0–15.0)
LYMPHS PCT: 6 % — AB (ref 12–46)
Lymphs Abs: 1 10*3/uL (ref 0.7–4.0)
MCH: 30.4 pg (ref 26.0–34.0)
MCHC: 34.5 g/dL (ref 30.0–36.0)
MCV: 88.2 fL (ref 78.0–100.0)
Monocytes Absolute: 0.5 10*3/uL (ref 0.1–1.0)
Monocytes Relative: 3 % (ref 3–12)
Neutro Abs: 14.8 10*3/uL — ABNORMAL HIGH (ref 1.7–7.7)
Neutrophils Relative %: 91 % — ABNORMAL HIGH (ref 43–77)
Platelets: 203 10*3/uL (ref 150–400)
RBC: 4.57 MIL/uL (ref 3.87–5.11)
RDW: 13 % (ref 11.5–15.5)
WBC: 16.4 10*3/uL — ABNORMAL HIGH (ref 4.0–10.5)

## 2014-11-05 LAB — COMPREHENSIVE METABOLIC PANEL
ALBUMIN: 4.5 g/dL (ref 3.5–5.0)
ALT: 33 U/L (ref 14–54)
AST: 33 U/L (ref 15–41)
Alkaline Phosphatase: 47 U/L (ref 38–126)
Anion gap: 14 (ref 5–15)
BILIRUBIN TOTAL: 0.6 mg/dL (ref 0.3–1.2)
BUN: 11 mg/dL (ref 6–20)
CALCIUM: 9.5 mg/dL (ref 8.9–10.3)
CO2: 22 mmol/L (ref 22–32)
Chloride: 105 mmol/L (ref 101–111)
Creatinine, Ser: 0.71 mg/dL (ref 0.44–1.00)
GFR calc Af Amer: >60 mL/min (ref >60)
GLUCOSE: 155 mg/dL — AB (ref 65–99)
Potassium: 3.5 mmol/L (ref 3.5–5.1)
Sodium: 141 mmol/L (ref 135–145)
TOTAL PROTEIN: 7.3 g/dL (ref 6.5–8.1)

## 2014-11-05 LAB — URINE MICROSCOPIC-ADD ON

## 2014-11-05 LAB — LIPASE, BLOOD: LIPASE: 16 U/L — AB (ref 22–51)

## 2014-11-05 LAB — ETHANOL: Alcohol, Ethyl (B): <5 mg/dL (ref <5)

## 2014-11-05 LAB — POC URINE PREG, ED: Preg Test, Ur: NEGATIVE

## 2014-11-05 MED ORDER — SODIUM CHLORIDE 0.9 % IV BOLUS (SEPSIS)
1000.0000 mL | Freq: Once | INTRAVENOUS | Status: AC
  Administered 2014-11-05: 1000 mL via INTRAVENOUS

## 2014-11-05 MED ORDER — ONDANSETRON HCL 4 MG/2ML IJ SOLN
4.0000 mg | Freq: Once | INTRAMUSCULAR | Status: AC
  Administered 2014-11-05: 4 mg via INTRAVENOUS
  Filled 2014-11-05: qty 2

## 2014-11-05 MED ORDER — LANSOPRAZOLE 15 MG PO TBDP: 15.0000 mg | ORAL_TABLET | Freq: Every day | ORAL | Status: DC

## 2014-11-05 MED ORDER — GI COCKTAIL ~~LOC~~
30.0000 mL | Freq: Once | ORAL | Status: AC
  Administered 2014-11-05: 30 mL via ORAL
  Filled 2014-11-05: qty 30

## 2014-11-05 MED ORDER — METOCLOPRAMIDE HCL 5 MG/ML IJ SOLN
10.0000 mg | Freq: Once | INTRAMUSCULAR | Status: AC
Start: 2014-11-05 — End: 2014-11-05
  Administered 2014-11-05: 10 mg via INTRAVENOUS
  Filled 2014-11-05: qty 2

## 2014-11-05 MED ORDER — PROMETHAZINE HCL 25 MG/ML IJ SOLN
12.5000 mg | Freq: Once | INTRAMUSCULAR | Status: AC
  Administered 2014-11-05: 12.5 mg via INTRAVENOUS
  Filled 2014-11-05: qty 1

## 2014-11-05 MED ORDER — PROMETHAZINE HCL 25 MG PO TABS: 25.0000 mg | ORAL_TABLET | Freq: Four times a day (QID) | ORAL | Status: DC | PRN

## 2014-11-05 MED ORDER — PANTOPRAZOLE SODIUM 40 MG IV SOLR
40.0000 mg | Freq: Once | INTRAVENOUS | Status: AC
  Administered 2014-11-05: 40 mg via INTRAVENOUS
  Filled 2014-11-05: qty 40

## 2014-11-05 NOTE — ED Provider Notes (Signed)
Pt signed out me at shift change by Jaynie Crumble PA pending anti-medic administration and by mouth challenge. Patient here for nausea and vomiting, started this morning, reports a previous history of the same. She has abdominal discomfort, nonfocal on exam, no other concerning signs or symptoms at this time. At the time of my evaluation patient was lying in the bed comfortably, holding an empty green bag in the event she became nauseous again. Reports that she's feeling better, has not had vomiting since her Phenergan, continues to deny significant abdominal discomfort or any other concerning signs or symptoms. Patient was given by mouth challenge, and is discharged home with antibiotics, strict return precautions. She will also be given a prescription for proton pump inhibitor, instructions to avoid alcohol abuse or aggravating foods. Pt understood and was in agreement to today's plan, she had no further questions at the time of discharge.  Eyvonne Mechanic, PA-C 11/06/14 0009  Doug Sou, MD 11/06/14 Burna Mortimer

## 2014-11-05 NOTE — ED Notes (Addendum)
Patient states she has been dry heaving and vomiting since 0900 today. Patient states she has a history of gastritis. Patient's brother states that she drank red wine yesterday.

## 2014-11-05 NOTE — ED Notes (Signed)
This RN ambulated pt to bathroom, informed pt of need for urine sample. Pt attempted to provide urine sample unsuccessfully.

## 2014-11-05 NOTE — ED Notes (Signed)
Pt asked to give urine specimen while walking to bathroom. Pt stated " No" not here for that. Nurse Dorene Grebe aware.

## 2014-11-05 NOTE — Discharge Instructions (Signed)
Monitor for new or worsening signs or symptoms, return immediately if any present. Please follow-up with primary care [place] amounts for further evaluation and management

## 2014-11-05 NOTE — ED Notes (Signed)
I attempt to collect blood and was unsuccessful.  I made nurse aware.

## 2014-11-05 NOTE — ED Provider Notes (Signed)
CSN: [id number]     Arrival date & time 11/05/14  1318 History   First MD Initiated Contact with Patient 11/05/14 1425     Chief Complaint  Patient presents with  . Emesis     (Consider location/radiation/quality/duration/timing/severity/associated sxs/prior Treatment) HPI Tina Colon is a 34 y.o. female who presents to emergency department complaining of nausea vomiting onset this morning. Patient states she is unable to keep anything down. Patient reports she has had similar symptoms in the past, diagnosed with gastritis. She reports mild epigastric pain, states her pain is not that is more than nausea and queasy feeling and is making me feel bad." She denies any fever. She has not tried taking anything for her symptoms at home. She denies any urinary symptoms. No back pain. She states she does drink alcohol daily, last drink was yesterday. She denies current smoking. Denies NSAIDs. States she has had some loose stools, last bowel movement today. Denies blood in her stool or emesis. Nothing is really her symptoms better or worse.  Past Medical History  Diagnosis Date  . ETOH abuse   . Gastritis    History reviewed. No pertinent past surgical history. History reviewed. No pertinent family history. History  Substance Use Topics  . Smoking status: Former Games developer  . Smokeless tobacco: Never Used  . Alcohol Use: Yes     Comment: daily use- wine and beer   OB History    No data available     Review of Systems  Constitutional: Negative for fever and chills.  Respiratory: Negative for cough, chest tightness and shortness of breath.   Cardiovascular: Negative for chest pain, palpitations and leg swelling.  Gastrointestinal: Positive for nausea, vomiting and abdominal pain. Negative for diarrhea.  Genitourinary: Negative for dysuria, flank pain, vaginal bleeding, vaginal discharge, vaginal pain and pelvic pain.  Musculoskeletal: Negative for myalgias, arthralgias, neck pain and neck  stiffness.  Skin: Negative for rash.  Neurological: Negative for dizziness, weakness and headaches.  All other systems reviewed and are negative.     Allergies  Penicillins  Home Medications   Prior to Admission medications   Medication Sig Start Date End Date Taking? Authorizing Provider  metroNIDAZOLE (FLAGYL) 500 MG tablet Take 500 mg by mouth 2 (two) times daily. 11/03/14  Yes Historical Provider, MD  ciprofloxacin (CIPRO) 500 MG tablet Take 1 tablet (500 mg total) by mouth 2 (two) times daily. Patient not taking: Reported on 11/05/2014 11/29/13   Elvina Sidle, MD  metroNIDAZOLE (FLAGYL) 500 MG tablet Take 1 tablet (500 mg total) by mouth 3 (three) times daily. Patient not taking: Reported on 11/05/2014 11/29/13   Elvina Sidle, MD   BP 146/79 mmHg  Pulse 59  Temp(Src) 97.5 F (36.4 C) (Oral)  Resp 22  SpO2 100%  LMP 11/05/2014 Physical Exam  Constitutional: She appears well-developed and well-nourished. No distress.  HENT:  Head: Normocephalic.  Eyes: Conjunctivae are normal.  Neck: Neck supple.  Cardiovascular: Normal rate, regular rhythm and normal heart sounds.   Pulmonary/Chest: Effort normal and breath sounds normal. No respiratory distress. She has no wheezes. She has no rales.  Abdominal: Soft. Bowel sounds are normal. She exhibits no distension. There is tenderness. There is no rebound.  Epigastric tenderness  Musculoskeletal: She exhibits no edema.  Neurological: She is alert.  Skin: Skin is warm and dry.  Psychiatric: She has a normal mood and affect. Her behavior is normal.  Nursing note and vitals reviewed.   ED Course  Procedures (including  critical care time) Labs Review Labs Reviewed  CBC WITH DIFFERENTIAL/PLATELET - Abnormal; Notable for the following:    WBC 16.4 (*)    Neutrophils Relative % 91 (*)    Neutro Abs 14.8 (*)    Lymphocytes Relative 6 (*)    All other components within normal limits  COMPREHENSIVE METABOLIC PANEL - Abnormal;  Notable for the following:    Glucose, Bld 155 (*)    All other components within normal limits  LIPASE, BLOOD - Abnormal; Notable for the following:    Lipase 16 (*)    All other components within normal limits  URINALYSIS, ROUTINE W REFLEX MICROSCOPIC (NOT AT [facility]) - Abnormal; Notable for the following:    Hgb urine dipstick LARGE (*)    Ketones, ur >80 (*)    Leukocytes, UA SMALL (*)    All other components within normal limits  URINE MICROSCOPIC-ADD ON - Abnormal; Notable for the following:    Squamous Epithelial / LPF FEW (*)    All other components within normal limits  ETHANOL  POC URINE PREG, ED    Imaging Review No results found.   EKG Interpretation None      MDM   Final diagnoses:  None    Pt with hx of alcohol abuse, gastritis, here with nausea, vomiting. No significant abdominal pain or tenderness, just slight in epigastric area. Doubt PUD or free air. No surgical abdomen. Will get labs including lipase, IV fluids started, will try anti emetics. Pt already received zofran withno improvement. Will try reglan.    3:30 PM Patient is feeling much better. She states her nausea improved. She is aware when a urine sample. She will continue to receive IV fluids.  4:43 PM Still having some nausea, will order Phenergan. IV fluids still running. Urine analysis pending.  Pt signed out at shift change. Plan: Symptom control, home with antiemetics and ppi  Filed Vitals:   11/05/14 1933  BP: 117/68  Pulse: 63  Temp: 99 F (37.2 C)  Resp: [address], PA-C 11/06/14 8469  Mancel Bale, MD 11/06/14 [phone]

## 2015-05-27 ENCOUNTER — Emergency Department (HOSPITAL_COMMUNITY)
Admission: EM | Admit: 2015-05-27 | Discharge: 2015-05-28 | Disposition: A | Discharge status: Home / Self Care | Payer: BLUE CROSS/BLUE SHIELD | Attending: Emergency Medicine | Admitting: Emergency Medicine

## 2015-05-27 DIAGNOSIS — Z79899 Other long term (current) drug therapy: Secondary | ICD-10-CM | POA: Diagnosis not present

## 2015-05-27 DIAGNOSIS — Z87891 Personal history of nicotine dependence: Secondary | ICD-10-CM | POA: Insufficient documentation

## 2015-05-27 DIAGNOSIS — K2970 Gastritis, unspecified, without bleeding: Secondary | ICD-10-CM | POA: Diagnosis not present

## 2015-05-27 DIAGNOSIS — Z3202 Encounter for pregnancy test, result negative: Secondary | ICD-10-CM | POA: Insufficient documentation

## 2015-05-27 DIAGNOSIS — R1013 Epigastric pain: Secondary | ICD-10-CM | POA: Diagnosis present

## 2015-05-27 DIAGNOSIS — Z792 Long term (current) use of antibiotics: Secondary | ICD-10-CM | POA: Insufficient documentation

## 2015-05-27 DIAGNOSIS — Z880 Allergy status to penicillin: Secondary | ICD-10-CM | POA: Diagnosis not present

## 2015-05-27 MED ORDER — ONDANSETRON HCL 4 MG/2ML IJ SOLN: 4.0000 mg | Freq: Once | INTRAMUSCULAR | Status: DC | PRN

## 2015-05-27 NOTE — ED Notes (Signed)
Pt states she is unable to provide urine specimen at this time.

## 2015-05-27 NOTE — ED Notes (Addendum)
Pt c/o abdominal pain, N/V, since 1830 today. Pt denies diarrhea and blood in vomit. Hx of gastritis

## 2015-05-27 NOTE — ED Provider Notes (Signed)
CSN: [id number]     Arrival date & time 05/27/15  2259 History  By signing my name below, I, Tina Colon, attest that this documentation has been prepared under the direction and in the presence of Gilda Crease, MD. Electronically Signed: Phillis Colon, ED Scribe. 05/28/2015. 12:00 AM.    Chief Complaint  Patient presents with  . Abdominal Pain  . Emesis   The history is provided by the patient. No language interpreter was used.  HPI Comments: Tina Colon is a 34 y.o. Female with a hx of gastritis and ETOH abuse who presents to the Emergency Department complaining of central epigastric pain, nausea, and vomiting onset 6 hours ago. Pt reports associated diarrhea, stating a few episodes earlier today. She reports that she takes antacids daily for the gastritis and has a hx of similar symptoms. She has not tried anything else for her symptoms. She denies fever, chills, hematochezia, hematemesis.  Past Medical History  Diagnosis Date  . ETOH abuse   . Gastritis    No past surgical history on file. No family history on file. Social History  Substance Use Topics  . Smoking status: Former Games developer  . Smokeless tobacco: Never Used  . Alcohol Use: Yes     Comment: daily use- wine and beer   OB History    No data available     Review of Systems  Gastrointestinal: Positive for nausea, vomiting, abdominal pain and diarrhea. Negative for blood in stool.  All other systems reviewed and are negative.     Allergies  Penicillins  Home Medications   Prior to Admission medications   Medication Sig Start Date End Date Taking? Authorizing Provider  ciprofloxacin (CIPRO) 500 MG tablet Take 1 tablet (500 mg total) by mouth 2 (two) times daily. Patient not taking: Reported on 11/05/2014 11/29/13   Elvina Sidle, MD  lansoprazole (PREVACID SOLUTAB) 15 MG disintegrating tablet Take 1 tablet (15 mg total) by mouth daily at 12 noon. 11/05/14   Eyvonne Mechanic, PA-C  metroNIDAZOLE  (FLAGYL) 500 MG tablet Take 1 tablet (500 mg total) by mouth 3 (three) times daily. Patient not taking: Reported on 11/05/2014 11/29/13   Elvina Sidle, MD  metroNIDAZOLE (FLAGYL) 500 MG tablet Take 500 mg by mouth 2 (two) times daily. 11/03/14   Historical Provider, MD  promethazine (PHENERGAN) 25 MG tablet Take 1 tablet (25 mg total) by mouth every 6 (six) hours as needed for nausea or vomiting. 11/05/14   Eyvonne Mechanic, PA-C   BP 156/86 mmHg  Pulse 60  Temp(Src) 97.7 F (36.5 C) (Oral)  Resp 16  SpO2 100% Physical Exam  Constitutional: She is oriented to person, place, and time. She appears well-developed and well-nourished. No distress.  HENT:  Head: Normocephalic and atraumatic.  Right Ear: Hearing normal.  Left Ear: Hearing normal.  Nose: Nose normal.  Mouth/Throat: Oropharynx is clear and moist and mucous membranes are normal.  Eyes: Conjunctivae and EOM are normal. Pupils are equal, round, and reactive to light.  Neck: Normal range of motion. Neck supple.  Cardiovascular: Regular rhythm, S1 normal and S2 normal.  Exam reveals no gallop and no friction rub.   No murmur heard. Pulmonary/Chest: Effort normal and breath sounds normal. No respiratory distress. She exhibits no tenderness.  Abdominal: Soft. Normal appearance and bowel sounds are normal. There is no hepatosplenomegaly. There is tenderness. There is no rebound, no guarding, no tenderness at McBurney's point and negative Murphy's sign. No hernia.  Mild epigastric tenderness  Musculoskeletal:  Normal range of motion.  Neurological: She is alert and oriented to person, place, and time. She has normal strength. No cranial nerve deficit or sensory deficit. Coordination normal. GCS eye subscore is 4. GCS verbal subscore is 5. GCS motor subscore is 6.  Skin: Skin is warm, dry and intact. No rash noted. No cyanosis.  Psychiatric: She has a normal mood and affect. Her speech is normal and behavior is normal. Thought content normal.   Nursing note and vitals reviewed.   ED Course  Procedures (including critical care time) DIAGNOSTIC STUDIES: Oxygen Saturation is 100% on RA, normal by my interpretation.    COORDINATION OF CARE: 12:02 AM-Discussed treatment plan which includes labs with pt at bedside and pt agreed to plan.    Labs Review Labs Reviewed  LIPASE, BLOOD  COMPREHENSIVE METABOLIC PANEL  CBC  URINALYSIS, ROUTINE W REFLEX MICROSCOPIC (NOT AT [facility])  I-STAT BETA HCG BLOOD, ED ([facility], [facility], [facility] ONLY)    Imaging Review No results found. I have personally reviewed and evaluated these images and lab results as part of my medical decision-making.   EKG Interpretation None      MDM   Final diagnoses:  None  Gastritis  Presents to the emergency department with complaints of nausea, vomiting, abdominal pain. Patient reports that she has had similar symptoms in the past with gastritis. Patient has not had any hematemesis or melena. Lab work is unremarkable. Examination does not suggest acute surgical process. Patient improved with symptomatic treatment.  I personally performed the services described in this documentation, which was scribed in my presence. The recorded information has been reviewed and is accurate.      Gilda Crease, MD 05/28/15 [phone]

## 2015-05-28 LAB — COMPREHENSIVE METABOLIC PANEL
ALBUMIN: 4.5 g/dL (ref 3.5–5.0)
ALK PHOS: 43 U/L (ref 38–126)
ALT: 17 U/L (ref 14–54)
ANION GAP: 12 (ref 5–15)
AST: 23 U/L (ref 15–41)
BUN: 13 mg/dL (ref 6–20)
CO2: 22 mmol/L (ref 22–32)
CREATININE: 0.83 mg/dL (ref 0.44–1.00)
Calcium: 9.7 mg/dL (ref 8.9–10.3)
Chloride: 109 mmol/L (ref 101–111)
GFR calc non Af Amer: >60 mL/min (ref >60)
Glucose, Bld: 173 mg/dL — ABNORMAL HIGH (ref 65–99)
Potassium: 3.6 mmol/L (ref 3.5–5.1)
Sodium: 143 mmol/L (ref 135–145)
TOTAL PROTEIN: 7.1 g/dL (ref 6.5–8.1)
Total Bilirubin: 0.5 mg/dL (ref 0.3–1.2)

## 2015-05-28 LAB — URINALYSIS, ROUTINE W REFLEX MICROSCOPIC
BILIRUBIN URINE: NEGATIVE
Glucose, UA: NEGATIVE mg/dL
KETONES UR: 40 mg/dL — AB
NITRITE: NEGATIVE
PROTEIN: NEGATIVE mg/dL
SPECIFIC GRAVITY, URINE: 1.02 (ref 1.005–1.030)
pH: 7.5 (ref 5.0–8.0)

## 2015-05-28 LAB — I-STAT BETA HCG BLOOD, ED (MC, WL, AP ONLY)

## 2015-05-28 LAB — CBC
HCT: 39 % (ref 36.0–46.0)
HEMOGLOBIN: 13.6 g/dL (ref 12.0–15.0)
MCH: 31.2 pg (ref 26.0–34.0)
MCHC: 34.9 g/dL (ref 30.0–36.0)
MCV: 89.4 fL (ref 78.0–100.0)
PLATELETS: 142 10*3/uL — AB (ref 150–400)
RBC: 4.36 MIL/uL (ref 3.87–5.11)
RDW: 13 % (ref 11.5–15.5)
WBC: 10.4 10*3/uL (ref 4.0–10.5)

## 2015-05-28 LAB — URINE MICROSCOPIC-ADD ON

## 2015-05-28 LAB — LIPASE, BLOOD: Lipase: 26 U/L (ref 11–51)

## 2015-05-28 MED ORDER — PANTOPRAZOLE SODIUM 20 MG PO TBEC: 20.0000 mg | DELAYED_RELEASE_TABLET | Freq: Every day | ORAL | Status: DC

## 2015-05-28 MED ORDER — ONDANSETRON HCL 4 MG/2ML IJ SOLN
4.0000 mg | Freq: Once | INTRAMUSCULAR | Status: AC
  Administered 2015-05-28: 4 mg via INTRAVENOUS
  Filled 2015-05-28: qty 2

## 2015-05-28 MED ORDER — SUCRALFATE 1 GM/10ML PO SUSP: 1.0000 g | Freq: Three times a day (TID) | ORAL | Status: DC

## 2015-05-28 MED ORDER — SODIUM CHLORIDE 0.9 % IV BOLUS (SEPSIS)
1000.0000 mL | Freq: Once | INTRAVENOUS | Status: AC
  Administered 2015-05-28: 1000 mL via INTRAVENOUS

## 2015-05-28 MED ORDER — METOCLOPRAMIDE HCL 5 MG/ML IJ SOLN
10.0000 mg | Freq: Once | INTRAMUSCULAR | Status: AC
  Administered 2015-05-28: 10 mg via INTRAVENOUS
  Filled 2015-05-28: qty 2

## 2015-05-28 MED ORDER — PROMETHAZINE HCL 25 MG PO TABS: 25.0000 mg | ORAL_TABLET | Freq: Four times a day (QID) | ORAL | Status: DC | PRN

## 2015-05-28 MED ORDER — HYDROCODONE-ACETAMINOPHEN 5-325 MG PO TABS: 2.0000 | ORAL_TABLET | ORAL | Status: DC | PRN

## 2015-05-28 MED ORDER — HYDROMORPHONE HCL 1 MG/ML IJ SOLN
1.0000 mg | Freq: Once | INTRAMUSCULAR | Status: AC
  Administered 2015-05-28: 1 mg via INTRAVENOUS
  Filled 2015-05-28: qty 1

## 2015-05-28 MED ORDER — HYDROCODONE-ACETAMINOPHEN 5-325 MG PO TABS
1.0000 | ORAL_TABLET | Freq: Once | ORAL | Status: AC
  Administered 2015-05-28: 1 via ORAL
  Filled 2015-05-28: qty 1

## 2015-05-28 NOTE — Discharge Instructions (Signed)

## 2015-05-29 ENCOUNTER — Encounter (HOSPITAL_COMMUNITY): Payer: Self-pay | Admitting: *Deleted

## 2015-05-29 ENCOUNTER — Emergency Department (HOSPITAL_COMMUNITY): Payer: BLUE CROSS/BLUE SHIELD

## 2015-05-29 ENCOUNTER — Emergency Department (HOSPITAL_COMMUNITY)
Admission: EM | Admit: 2015-05-29 | Discharge: 2015-05-29 | Disposition: A | Discharge status: Home / Self Care | Payer: BLUE CROSS/BLUE SHIELD | Attending: Emergency Medicine | Admitting: Emergency Medicine

## 2015-05-29 DIAGNOSIS — Z79899 Other long term (current) drug therapy: Secondary | ICD-10-CM | POA: Diagnosis not present

## 2015-05-29 DIAGNOSIS — Z880 Allergy status to penicillin: Secondary | ICD-10-CM | POA: Insufficient documentation

## 2015-05-29 DIAGNOSIS — Z87891 Personal history of nicotine dependence: Secondary | ICD-10-CM | POA: Diagnosis not present

## 2015-05-29 DIAGNOSIS — R1013 Epigastric pain: Secondary | ICD-10-CM | POA: Diagnosis present

## 2015-05-29 DIAGNOSIS — K859 Acute pancreatitis without necrosis or infection, unspecified: Secondary | ICD-10-CM | POA: Insufficient documentation

## 2015-05-29 LAB — URINALYSIS, ROUTINE W REFLEX MICROSCOPIC
BILIRUBIN URINE: NEGATIVE
GLUCOSE, UA: NEGATIVE mg/dL
Ketones, ur: 40 mg/dL — AB
Nitrite: NEGATIVE
PROTEIN: 30 mg/dL — AB
Specific Gravity, Urine: 1.025 (ref 1.005–1.030)
pH: 8 (ref 5.0–8.0)

## 2015-05-29 LAB — COMPREHENSIVE METABOLIC PANEL
ALBUMIN: 4.6 g/dL (ref 3.5–5.0)
ALK PHOS: 48 U/L (ref 38–126)
ALT: 18 U/L (ref 14–54)
AST: 19 U/L (ref 15–41)
Anion gap: 12 (ref 5–15)
BUN: 12 mg/dL (ref 6–20)
CALCIUM: 9.6 mg/dL (ref 8.9–10.3)
CHLORIDE: 104 mmol/L (ref 101–111)
CO2: 24 mmol/L (ref 22–32)
CREATININE: 0.7 mg/dL (ref 0.44–1.00)
GFR calc Af Amer: >60 mL/min (ref >60)
GFR calc non Af Amer: >60 mL/min (ref >60)
GLUCOSE: 131 mg/dL — AB (ref 65–99)
Potassium: 3 mmol/L — ABNORMAL LOW (ref 3.5–5.1)
SODIUM: 140 mmol/L (ref 135–145)
Total Bilirubin: 0.8 mg/dL (ref 0.3–1.2)
Total Protein: 7.9 g/dL (ref 6.5–8.1)

## 2015-05-29 LAB — CBC
HCT: 40.6 % (ref 36.0–46.0)
Hemoglobin: 14.1 g/dL (ref 12.0–15.0)
MCH: 30.9 pg (ref 26.0–34.0)
MCHC: 34.7 g/dL (ref 30.0–36.0)
MCV: 88.8 fL (ref 78.0–100.0)
PLATELETS: 249 10*3/uL (ref 150–400)
RBC: 4.57 MIL/uL (ref 3.87–5.11)
RDW: 13 % (ref 11.5–15.5)
WBC: 11.2 10*3/uL — ABNORMAL HIGH (ref 4.0–10.5)

## 2015-05-29 LAB — URINE MICROSCOPIC-ADD ON

## 2015-05-29 LAB — LIPASE, BLOOD: Lipase: 195 U/L — ABNORMAL HIGH (ref 11–51)

## 2015-05-29 MED ORDER — SODIUM CHLORIDE 0.9 % IV SOLN
1000.0000 mL | Freq: Once | INTRAVENOUS | Status: AC
  Administered 2015-05-29: 1000 mL via INTRAVENOUS

## 2015-05-29 MED ORDER — SODIUM CHLORIDE 0.9 % IV SOLN
1000.0000 mL | INTRAVENOUS | Status: DC
  Administered 2015-05-29: 1000 mL via INTRAVENOUS

## 2015-05-29 MED ORDER — POTASSIUM CHLORIDE CRYS ER 20 MEQ PO TBCR: 40.0000 meq | EXTENDED_RELEASE_TABLET | Freq: Every day | ORAL | Status: DC

## 2015-05-29 MED ORDER — PANTOPRAZOLE SODIUM 40 MG IV SOLR
40.0000 mg | Freq: Once | INTRAVENOUS | Status: AC
  Administered 2015-05-29: 40 mg via INTRAVENOUS
  Filled 2015-05-29: qty 40

## 2015-05-29 MED ORDER — ONDANSETRON HCL 4 MG PO TABS: 4.0000 mg | ORAL_TABLET | ORAL | Status: DC | PRN

## 2015-05-29 MED ORDER — POTASSIUM CHLORIDE CRYS ER 20 MEQ PO TBCR
40.0000 meq | EXTENDED_RELEASE_TABLET | Freq: Once | ORAL | Status: AC
Start: 2015-05-29 — End: 2015-05-29
  Administered 2015-05-29: 40 meq via ORAL
  Filled 2015-05-29: qty 2

## 2015-05-29 MED ORDER — ONDANSETRON HCL 4 MG/2ML IJ SOLN
4.0000 mg | Freq: Once | INTRAMUSCULAR | Status: AC
  Administered 2015-05-29: 4 mg via INTRAVENOUS
  Filled 2015-05-29: qty 2

## 2015-05-29 MED ORDER — HYDROMORPHONE HCL 1 MG/ML IJ SOLN
1.0000 mg | Freq: Once | INTRAMUSCULAR | Status: AC
  Administered 2015-05-29: 1 mg via INTRAVENOUS
  Filled 2015-05-29: qty 1

## 2015-05-29 MED ORDER — IOHEXOL 300 MG/ML  SOLN
100.0000 mL | Freq: Once | INTRAMUSCULAR | Status: AC | PRN
  Administered 2015-05-29: 100 mL via INTRAVENOUS

## 2015-05-29 MED ORDER — POTASSIUM CHLORIDE 10 MEQ/100ML IV SOLN
10.0000 meq | Freq: Once | INTRAVENOUS | Status: AC
  Administered 2015-05-29: 10 meq via INTRAVENOUS
  Filled 2015-05-29: qty 100

## 2015-05-29 NOTE — ED Notes (Signed)
Patient made aware that urine sample is needed. Patient states she cannot void right now due to dehydration. Patient encouraged to void when able.

## 2015-05-29 NOTE — ED Notes (Signed)
Discharge instructions, follow up care, and prescriptions reviewed with patient. Patient verbalized understanding. 

## 2015-05-29 NOTE — ED Notes (Signed)
Patient transported to CT 

## 2015-05-29 NOTE — Discharge Instructions (Signed)
Acute Pancreatitis Acute pancreatitis is a disease in which the pancreas becomes suddenly inflamed. The pancreas is a large gland located behind your stomach. The pancreas produces enzymes that help digest food. The pancreas also releases the hormones glucagon and insulin that help regulate blood sugar. Damage to the pancreas occurs when the digestive enzymes from the pancreas are activated and begin attacking the pancreas before being released into the intestine. Most acute attacks last a couple of days and can cause serious complications. Some people become dehydrated and develop low blood pressure. In severe cases, bleeding into the pancreas can lead to shock and can be life-threatening. The lungs, heart, and kidneys may fail. CAUSES  Pancreatitis can happen to anyone. In some cases, the cause is unknown. Most cases are caused by:  Alcohol abuse.  Gallstones. Other less common causes are:  Certain medicines.  Exposure to certain chemicals.  Infection.  Damage caused by an accident (trauma).  Abdominal surgery. SYMPTOMS   Pain in the upper abdomen that may radiate to the back.  Tenderness and swelling of the abdomen.  Nausea and vomiting. DIAGNOSIS  Your caregiver will perform a physical exam. Blood and stool tests may be done to confirm the diagnosis. Imaging tests may also be done, such as X-rays, CT scans, or an ultrasound of the abdomen. TREATMENT  Treatment usually requires a stay in the hospital. Treatment may include:  Pain medicine.  Fluid replacement through an intravenous line (IV).  Placing a tube in the stomach to remove stomach contents and control vomiting.  Not eating for 3 or 4 days. This gives your pancreas a rest, because enzymes are not being produced that can cause further damage.  Antibiotic medicines if your condition is caused by an infection.  Surgery of the pancreas or gallbladder. HOME CARE INSTRUCTIONS   Follow the diet advised by your  caregiver. This may involve avoiding alcohol and decreasing the amount of fat in your diet.  Eat smaller, more frequent meals. This reduces the amount of digestive juices the pancreas produces.  Drink enough fluids to keep your urine clear or pale yellow.  Only take over-the-counter or prescription medicines as directed by your caregiver.  Avoid drinking alcohol if it caused your condition.  Do not smoke.  Get plenty of rest.  Check your blood sugar at home as directed by your caregiver.  Keep all follow-up appointments as directed by your caregiver. SEEK MEDICAL CARE IF:   You do not recover as quickly as expected.  You develop new or worsening symptoms.  You have persistent pain, weakness, or nausea.  You recover and then have another episode of pain. SEEK IMMEDIATE MEDICAL CARE IF:   You are unable to eat or keep fluids down.  Your pain becomes severe.  You have a fever or persistent symptoms for more than 2 to 3 days.  You have a fever and your symptoms suddenly get worse.  Your skin or the white part of your eyes turn yellow (jaundice).  You develop vomiting.  You feel dizzy, or you faint.  Your blood sugar is high (over 300 mg/dL). MAKE SURE YOU:   Understand these instructions.  Will watch your condition.  Will get help right away if you are not doing well or get worse.   This information is not intended to replace advice given to you by your health care provider. Make sure you discuss any questions you have with your health care provider.   Document Released: 05/26/2005 Document Revised: 11/25/2011  Document Reviewed: 09/04/2011 Elsevier Interactive Patient Education [employer]  Emergency Department Resource Guide 1) Find a Doctor and Pay Out of Pocket Although you won't have to find out who is covered by your insurance plan, it is a good idea to ask around and get recommendations. You will then need to call the office and see if the doctor  you have chosen will accept you as a new patient and what types of options they offer for patients who are self-pay. Some doctors offer discounts or will set up payment plans for their patients who do not have insurance, but you will need to ask so you aren't surprised when you get to your appointment.  2) Contact Your Local Health Department Not all health departments have doctors that can see patients for sick visits, but many do, so it is worth a call to see if yours does. If you don't know where your local health department is, you can check in your phone book. The CDC also has a tool to help you locate your state's health department, and many state websites also have listings of all of their local health departments.  3) Find a Walk-in Clinic If your illness is not likely to be very severe or complicated, you may want to try a walk in clinic. These are popping up all over the country in pharmacies, drugstores, and shopping centers. They're usually staffed by nurse practitioners or physician assistants that have been trained to treat common illnesses and complaints. They're usually fairly quick and inexpensive. However, if you have serious medical issues or chronic medical problems, these are probably not your best option.  No Primary Care Doctor: - Call Health Connect at  [phone] - they can help you locate a primary care doctor that  accepts your insurance, provides certain services, etc. - Physician Referral Service- [phone]  Chronic Pain Problems: Organization         Address  Phone   Notes  [facility]  [phone] Patients need to be referred by their primary care doctor.   Medication Assistance: Organization         Address  Phone   Notes  [facility] Medication [facility] [address] [place] [place], [place] [address] [phone] --Must be a resident of [facility] -- Must have NO insurance coverage whatsoever (no Medicaid/  Medicare, etc.) -- The pt. MUST have a primary care doctor that directs their care regularly and follows them in the community   [facility]ist  [phone]   [employer]  [phone]    Agencies that provide inexpensive medical care: Organization         Address  Phone   Notes  [facility]  [phone]   [facility]    [phone]   [facility] [address] [place], [place] [address] [phone]   [facility] [address] [place] [place] [phone]   Planned Parenthood    [phone]   [facility]    [phone]   [facility]  [address], [place] Phone:  [phone], Fax:  [phone] Hours of Operation:  9 am - 6 pm, M-F.  Also accepts Medicaid/Medicare and self-pay.  [facility]  [address], [place] Phone: [phone], Fax: [phone]. Hours of Operation:  8:30 am - 5:30 pm,  M-F.  Also accepts Medicaid and self-pay.  [facility] [place] [address], [place] Phone: [phone]   [facility] [address] Natasha Bence [place], [place] [phone], Ext. [phone] Mondays & Thursdays: 7-9 AM.  First 15 patients are seen on a first come, first serve basis.    Medicaid-accepting [facility] Providers:  Organization         Address  Phone   Notes  [facility] [address], [place] [phone] Also accepts self-pay patients.  [facility] [address] [place]ure[place], [place]  [phone]   [facility] [address], [place] [phone]   [facility] [address], [place] [phone]   Renaye Rakers [address], [place]   [phone] Only accepts [place] Access [place] patients after they have their name applied to their card.    Self-Pay (no insurance) in [facility]:  Organization         Address  Phone   Notes  Sickle Cell Patients, [facility] [address] [place], [place] [phone]   [facility] [address] [place], [place] [phone]   [facility] [place]  [address], [place] [phone]   [facility]/Dr. Osei-Bonsu  [address], [place] or [address], [place] [phone] Phone number for both [place] and [place] locations is the same.  [facility] and [facility] [address] [place] [phone]   [facility] [address], [place] or [address] [phone]   [facility] [address] [place] [phone], phone; [phone], fax Sees patients 1st and 3rd Saturday of every month.  Must not qualify for public or private insurance (i.e. Medicaid, Medicare, [place] Health Choice, Veterans' Benefits)  Household income should be no more than 200% of the poverty level The clinic cannot treat you if you are pregnant or think you are pregnant  Sexually transmitted diseases are not treated at the clinic.    Dental Care: Organization         Address  Phone  Notes  [facility] [address] [place], [place] [phone] Accepts children up to age 3 who are enrolled in [place] or [place] Health Choice; pregnant women with a Medicaid card; and children who have applied for Medicaid or [place] Health Choice, but were declined, whose parents can pay a reduced fee at time of service.  [facility] of [facility]  [address], [place] [phone] Accepts children up to age 70 who are enrolled in [place] or [place] Health Choice; pregnant women with a Medicaid card; and children who have applied for Medicaid or  Health Choice,  but were declined, whose parents can pay a reduced fee at time of service.  [facility]  [address] [place], [place] [phone] Patients are seen by appointment only. Walk-ins are not accepted. [facility] will see patients 39 years of age and older. Monday - Tuesday (8am-5pm) Most Wednesdays (8:30-5pm) $30 per visit, cash only  [facility]  [address], [facility] [phone] Patients are seen by appointment only. Walk-ins are not accepted.  [facility] will see patients 48 years of age and older. One Wednesday Evening (Monthly: Volunteer Based).  $30 per visit, cash only  [employer]  [phone] for adults; Children under age 21, call Graduate Pediatric Dentistry at [phone]. Children aged 43-14, please call [phone] to request a pediatric application.  Dental services are provided in all areas of dental care including fillings, crowns and bridges, complete and partial dentures, implants, gum treatment, root canals, and extractions. Preventive care is also provided. Treatment is provided to both adults and children. Patients are selected via a lottery and there is often a waiting list.   [facility] [address] [place]  [phone] [email or web address]   [facility] [address] [place], [place] [phone], Ext. [phone] Second and Fourth Thursday of each month, opens at 6:30 AM; Clinic ends at 9 AM.  Patients are seen on a first-come first-served basis, and a limited number are seen during each clinic.   [facility]  [address] Griffins [place], [place] [phone]   Eligibility Requirements You must have lived in [place], [place], or [place] counties for at least the last three months.   You cannot be eligible for state or federal sponsored [place], including [employer], [place], or [employer].   You generally  cannot be eligible for healthcare insurance through your employer.    How to apply: Eligibility screenings are held every Tuesday and Wednesday afternoon from 1:00 pm until 4:00 pm. You do not need an appointment for the interview!  [facility] [address] [place], [place] [phone]   [facility] Health Department  [phone]   [facility] Health Department  [phone]   [facility] Health Department  [phone]    Behavioral Health Resources in the Community: Intensive Outpatient Programs Organization         Address  Phone  Notes  [facility] [address], [place], [place] [phone]   [facility] Outpatient [address] [employer], [place] [phone]   [facility] [address], [place], [place]  [phone]   [facility] Mental Health [address]  [place], [place] [phone] or [phone]   Substance Abuse Resources Organization         Address  Phone  Notes  [facility]  [phone]   [facility]  [phone]   [facility]  [phone]   Floydene Flock  [phone]   Residential & Outpatient Substance Abuse Program  [phone]   Psychological Services Organization         Address  Phone  Notes  [facility]  [phone]   [facility]  [phone]   [facility] Mental Health [address] [place] [phone] or [phone]    Mobile Crisis Teams Organization         Address  Phone  Notes  [facility], Mobile Crisis Care Unit  [phone]   [facility]  [address] [place], [place] [phone]   Doristine Locks [address] [place] [phone]    Self-Help/Support Groups Organization         Address  Phone             Notes  [facility] [place] - variety of support groups  336- I7437963 Call for more  information  Narcotics Anonymous (NA), [facility]  [address], [employer] [place]  2 meetings at this location   Residential Sports administrator         Address  Phone  Notes  [facility] [address],    [place]  [phone]   [facility]  [address] [place] [address], [place], [place] [phone]   [facility] Treatment [facility] [address] [place] [address] [place], [place] [phone] Admissions: 8am-3pm M-F  [facility] [address]    [place], [place] [phone]   [facility] [address] [place], [place], [place] [phone]   [facility] [address],  [place], [place] [phone]   [facility] - Intensive Outpatient [address]ure[address], [place], [place] [phone]   [facility] ([facility] [address] [place]  [place], [place] [phone] or [phone]   [facility] ([facility]) [address], [place], [place] [phone] Accepts Medicaid  [facility] [address]  [place] [phone] Substance Abuse/Addiction Treatment   [facility] Organization         Address  Phone  Notes  [facility]  [phone]   Angie Fava, PhD [address] Ervin Knack [place], [place]   [phone] or [phone]   [facility] Behavioral   [address] [place], [place] [phone]   [facility] [address] [place], [place] [phone] Insurance/Medicaid/sponsorship through [facility] and Families [address]                                    [place], [place] [phone] Therapy/tele-psych/case  [facility] [address], [place] [phone]    Dr. Lolly Mustache  [phone]   [facility]  [employer] [facility] Dept. 1) [address] [place] 2) [address] [place] 3)  [address], [place] [phone]  [phone]   [facility] Child Abuse Hotline [phone] or [phone] (After Hours)

## 2015-05-29 NOTE — ED Notes (Signed)
Pt complains of nausea, URQ abdominal pain, vomiting since Sunday. Pt was seen at [facility], states her symptoms have not gotten better. Pt states the prescribed phenergan has not helped with her nausea.

## 2015-05-29 NOTE — Progress Notes (Signed)
Patient listed as having BCBS insurance without a pcp.  EDCM spoke to patient at bedside.  Patient confirms she does not have a pcp.  [facility] provided patient with list of pcps who accept BCBS within a ten mile radius of patient's zip code [address].  Patient thankful for resources.  Encouraged patient to schedule follow up appointment with new pcp when discharged.  No further EDCM needs at this time.

## 2015-05-29 NOTE — ED Provider Notes (Signed)
CSN: [id number]     Arrival date & time 05/29/15  1333 History   First MD Initiated Contact with Patient 05/29/15 1551     Chief Complaint  Patient presents with  . Abdominal Pain  . Emesis     (Consider location/radiation/quality/duration/timing/severity/associated sxs/prior Treatment) HPI The patient reports that she's been vomiting constantly for the past 3 days. She reports she has a lot of pain in her epigastrium. She worsens a constant aching and burning. No significant amount diarrhea. No fever. Patient reports she does drink alcohol but her last alcohol consumption was approximately 5 days ago. She reports her ongoing uses variable. She reports some day she might have just a beer other day she may have several. She denies any history of alcohol withdrawal. He denies known history of pancreatitis. Denies known history of problems with her gallbladder. Past Medical History  Diagnosis Date  . ETOH abuse   . Gastritis    History reviewed. No pertinent past surgical history. No family history on file. Social History  Substance Use Topics  . Smoking status: Former Games developer  . Smokeless tobacco: Never Used  . Alcohol Use: Yes     Comment: daily use- wine and beer   OB History    No data available     Review of Systems 10 Systems reviewed and are negative for acute change except as noted in the HPI.    Allergies  Penicillins  Home Medications   Prior to Admission medications   Medication Sig Start Date End Date Taking? Authorizing Provider  Calcium Carbonate (CALCIUM 600 PO) Take 600 mg by mouth daily.   Yes Historical Provider, MD  HYDROcodone-acetaminophen (NORCO/VICODIN) 5-325 MG tablet Take 2 tablets by mouth every 4 (four) hours as needed for moderate pain. 05/28/15  Yes Gilda Crease, MD  Multiple Vitamin (MULTIVITAMIN WITH MINERALS) TABS tablet Take 1 tablet by mouth daily.   Yes Historical Provider, MD  pantoprazole (PROTONIX) 20 MG tablet Take 1 tablet  (20 mg total) by mouth daily. 05/28/15  Yes Gilda Crease, MD  promethazine (PHENERGAN) 25 MG tablet Take 1 tablet (25 mg total) by mouth every 6 (six) hours as needed for nausea or vomiting. 05/28/15  Yes Gilda Crease, MD  sucralfate (CARAFATE) 1 GM/10ML suspension Take 10 mLs (1 g total) by mouth 4 (four) times daily -  with meals and at bedtime. 05/28/15  Yes Gilda Crease, MD  ondansetron (ZOFRAN) 4 MG tablet Take 1 tablet (4 mg total) by mouth every 4 (four) hours as needed for nausea or vomiting. 05/29/15   Arby Barrette, MD  potassium chloride SA (K-DUR,KLOR-CON) 20 MEQ tablet Take 2 tablets (40 mEq total) by mouth daily. 05/29/15   Arby Barrette, MD   BP 115/66 mmHg  Pulse 64  Temp(Src) 97.3 F (36.3 C) (Oral)  Resp 19  SpO2 99%  LMP 05/27/2015 Physical Exam  Constitutional: She is oriented to person, place, and time. She appears well-developed and well-nourished.  HENT:  Head: Normocephalic and atraumatic.  Eyes: EOM are normal. Pupils are equal, round, and reactive to light.  Neck: Neck supple.  Cardiovascular: Normal rate, regular rhythm, normal heart sounds and intact distal pulses.   Pulmonary/Chest: Effort normal and breath sounds normal.  Abdominal: Soft. Bowel sounds are normal. She exhibits no distension. There is tenderness.  Pain to palpation in the epigastrium. Lower abdomen is soft nontender.  Musculoskeletal: Normal range of motion. She exhibits no edema.  Neurological: She is alert and oriented  to person, place, and time. She has normal strength. Coordination normal. GCS eye subscore is 4. GCS verbal subscore is 5. GCS motor subscore is 6.  Skin: Skin is warm, dry and intact.  Psychiatric: She has a normal mood and affect.    ED Course  Procedures (including critical care time) Labs Review Labs Reviewed  LIPASE, BLOOD - Abnormal; Notable for the following:    Lipase 195 (*)    All other components within normal limits   COMPREHENSIVE METABOLIC PANEL - Abnormal; Notable for the following:    Potassium 3.0 (*)    Glucose, Bld 131 (*)    All other components within normal limits  CBC - Abnormal; Notable for the following:    WBC 11.2 (*)    All other components within normal limits  URINALYSIS, ROUTINE W REFLEX MICROSCOPIC (NOT AT [facility]) - Abnormal; Notable for the following:    APPearance TURBID (*)    Hgb urine dipstick LARGE (*)    Ketones, ur 40 (*)    Protein, ur 30 (*)    Leukocytes, UA TRACE (*)    All other components within normal limits  URINE MICROSCOPIC-ADD ON - Abnormal; Notable for the following:    Squamous Epithelial / LPF 0-5 (*)    Bacteria, UA FEW (*)    All other components within normal limits    Imaging Review Ct Abdomen Pelvis W Contrast  05/29/2015  CLINICAL DATA:  Nausea, vomiting and epigastric pain for 4 days. EXAM: CT ABDOMEN AND PELVIS WITH CONTRAST TECHNIQUE: Multidetector CT imaging of the abdomen and pelvis was performed using the standard protocol following bolus administration of intravenous contrast. CONTRAST:  OMNIPAQUE IOHEXOL 300 MG/ML  SOLN COMPARISON:  CT scan 09/07/2009. FINDINGS: Lower chest: The lung bases are clear. No pleural effusion or pulmonary nodules. The heart is upper limits of normal in size. No pericardial effusion. The distal esophagus is grossly normal. Hepatobiliary: No focal hepatic lesions or intrahepatic biliary dilatation. The gallbladder is normal. No common bile duct dilatation. Pancreas: No mass, inflammation or ductal dilatation. Spleen: Normal size.  No focal lesions. Adrenals/Urinary Tract: The adrenal glands are normal. Both kidneys are normal. Stomach/Bowel: The stomach, duodenum, small bowel and colon are unremarkable. No inflammatory changes, mass lesions or obstructive findings. The terminal ileum is normal. The appendix is normal. Vascular/Lymphatic: No mesenteric or retroperitoneal mass or adenopathy. Small scattered lymph nodes are  noted. The aorta is normal. The branch vessels are normal. The major venous structures are normal. Reproductive: The uterus and ovaries are normal. Other: No pelvic mass or adenopathy. No free pelvic fluid collections. The bladder appears normal. No inguinal mass or adenopathy. Musculoskeletal: No significant bony findings. 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 IMPRESSION: No acute abdominal/ pelvic findings, mass lesions or lymphadenopathy. Electronically Signed   By: Rudie Meyer M.D.   On: 05/29/2015 19:42   I have personally reviewed and evaluated these images and lab results as part of my medical decision-making.   EKG Interpretation None      MDM   Final diagnoses:  Acute pancreatitis, unspecified pancreatitis type   Patient is nontoxic with stable vital signs. CT did not show any acute pancreatic inflammation or other abnormal findings. At this time the patient is counseled on no alcohol use, continuing her medications for gastritis and starting Zofran with frequent sips of fluids. At this time I do feel she is stable  to initiate outpatient treatment. He is counseled on scheduling follow-up as soon as possible. Is counseled on signs and symptoms which return.    Arby Barrette, MD 05/29/15 [phone]

## 2015-05-30 ENCOUNTER — Emergency Department (HOSPITAL_COMMUNITY)
Admission: EM | Admit: 2015-05-30 | Discharge: 2015-05-30 | Disposition: A | Discharge status: Home / Self Care | Payer: BLUE CROSS/BLUE SHIELD | Attending: Emergency Medicine | Admitting: Emergency Medicine

## 2015-05-30 ENCOUNTER — Encounter (HOSPITAL_COMMUNITY): Payer: Self-pay | Admitting: *Deleted

## 2015-05-30 DIAGNOSIS — Z8719 Personal history of other diseases of the digestive system: Secondary | ICD-10-CM | POA: Insufficient documentation

## 2015-05-30 DIAGNOSIS — Z3202 Encounter for pregnancy test, result negative: Secondary | ICD-10-CM | POA: Diagnosis not present

## 2015-05-30 DIAGNOSIS — E876 Hypokalemia: Secondary | ICD-10-CM

## 2015-05-30 DIAGNOSIS — R1013 Epigastric pain: Secondary | ICD-10-CM | POA: Diagnosis present

## 2015-05-30 DIAGNOSIS — Z79899 Other long term (current) drug therapy: Secondary | ICD-10-CM | POA: Diagnosis not present

## 2015-05-30 DIAGNOSIS — Z880 Allergy status to penicillin: Secondary | ICD-10-CM | POA: Diagnosis not present

## 2015-05-30 DIAGNOSIS — F1210 Cannabis abuse, uncomplicated: Secondary | ICD-10-CM | POA: Insufficient documentation

## 2015-05-30 DIAGNOSIS — Z87891 Personal history of nicotine dependence: Secondary | ICD-10-CM | POA: Diagnosis not present

## 2015-05-30 DIAGNOSIS — I10 Essential (primary) hypertension: Secondary | ICD-10-CM

## 2015-05-30 DIAGNOSIS — G43A0 Cyclical vomiting, not intractable: Secondary | ICD-10-CM | POA: Insufficient documentation

## 2015-05-30 DIAGNOSIS — R1115 Cyclical vomiting syndrome unrelated to migraine: Secondary | ICD-10-CM

## 2015-05-30 LAB — URINALYSIS, ROUTINE W REFLEX MICROSCOPIC
Bilirubin Urine: NEGATIVE
GLUCOSE, UA: NEGATIVE mg/dL
Ketones, ur: 40 mg/dL — AB
LEUKOCYTES UA: NEGATIVE
Nitrite: NEGATIVE
Protein, ur: NEGATIVE mg/dL
Specific Gravity, Urine: 1.013 (ref 1.005–1.030)
pH: 7.5 (ref 5.0–8.0)

## 2015-05-30 LAB — LIPASE, BLOOD: LIPASE: 26 U/L (ref 11–51)

## 2015-05-30 LAB — COMPREHENSIVE METABOLIC PANEL
ALT: 21 U/L (ref 14–54)
ANION GAP: 13 (ref 5–15)
AST: 24 U/L (ref 15–41)
Albumin: 5.2 g/dL — ABNORMAL HIGH (ref 3.5–5.0)
Alkaline Phosphatase: 45 U/L (ref 38–126)
BUN: 11 mg/dL (ref 6–20)
CHLORIDE: 105 mmol/L (ref 101–111)
CO2: 21 mmol/L — ABNORMAL LOW (ref 22–32)
Calcium: 9.1 mg/dL (ref 8.9–10.3)
Creatinine, Ser: 0.76 mg/dL (ref 0.44–1.00)
Glucose, Bld: 127 mg/dL — ABNORMAL HIGH (ref 65–99)
POTASSIUM: 3.1 mmol/L — AB (ref 3.5–5.1)
Sodium: 139 mmol/L (ref 135–145)
Total Bilirubin: 0.3 mg/dL (ref 0.3–1.2)
Total Protein: 7.7 g/dL (ref 6.5–8.1)

## 2015-05-30 LAB — CBC WITH DIFFERENTIAL/PLATELET
BASOS ABS: 0 10*3/uL (ref 0.0–0.1)
Basophils Relative: 1 %
EOS PCT: 0 %
Eosinophils Absolute: 0 10*3/uL (ref 0.0–0.7)
HCT: 41.1 % (ref 36.0–46.0)
Hemoglobin: 14.5 g/dL (ref 12.0–15.0)
LYMPHS ABS: 1.3 10*3/uL (ref 0.7–4.0)
LYMPHS PCT: 15 %
MCH: 30.5 pg (ref 26.0–34.0)
MCHC: 35.3 g/dL (ref 30.0–36.0)
MCV: 86.5 fL (ref 78.0–100.0)
MONO ABS: 0.3 10*3/uL (ref 0.1–1.0)
Monocytes Relative: 4 %
Neutro Abs: 6.8 10*3/uL (ref 1.7–7.7)
Neutrophils Relative %: 80 %
PLATELETS: 219 10*3/uL (ref 150–400)
RBC: 4.75 MIL/uL (ref 3.87–5.11)
RDW: 12.7 % (ref 11.5–15.5)
WBC: 8.4 10*3/uL (ref 4.0–10.5)

## 2015-05-30 LAB — URINE MICROSCOPIC-ADD ON

## 2015-05-30 LAB — RAPID URINE DRUG SCREEN, HOSP PERFORMED
AMPHETAMINES: NOT DETECTED
Barbiturates: NOT DETECTED
Benzodiazepines: NOT DETECTED
COCAINE: NOT DETECTED
OPIATES: NOT DETECTED
TETRAHYDROCANNABINOL: POSITIVE — AB

## 2015-05-30 LAB — I-STAT BETA HCG BLOOD, ED (MC, WL, AP ONLY): I-stat hCG, quantitative: <5 m[IU]/mL (ref <5)

## 2015-05-30 MED ORDER — HYDROMORPHONE HCL 1 MG/ML IJ SOLN
1.0000 mg | Freq: Once | INTRAMUSCULAR | Status: AC
  Administered 2015-05-30: 1 mg via INTRAVENOUS
  Filled 2015-05-30: qty 1

## 2015-05-30 MED ORDER — FAMOTIDINE IN NACL 20-0.9 MG/50ML-% IV SOLN
20.0000 mg | Freq: Once | INTRAVENOUS | Status: AC
  Administered 2015-05-30: 20 mg via INTRAVENOUS
  Filled 2015-05-30: qty 50

## 2015-05-30 MED ORDER — MORPHINE SULFATE (PF) 4 MG/ML IV SOLN: 4.0000 mg | Freq: Once | INTRAVENOUS | Status: DC

## 2015-05-30 MED ORDER — PROMETHAZINE HCL 25 MG RE SUPP: 25.0000 mg | Freq: Four times a day (QID) | RECTAL | Status: DC | PRN

## 2015-05-30 MED ORDER — GI COCKTAIL ~~LOC~~: 30.0000 mL | Freq: Once | ORAL | Status: DC

## 2015-05-30 MED ORDER — DIPHENHYDRAMINE HCL 50 MG/ML IJ SOLN
25.0000 mg | Freq: Once | INTRAMUSCULAR | Status: AC
  Administered 2015-05-30: 25 mg via INTRAVENOUS
  Filled 2015-05-30: qty 1

## 2015-05-30 MED ORDER — PROCHLORPERAZINE EDISYLATE 5 MG/ML IJ SOLN
5.0000 mg | Freq: Once | INTRAMUSCULAR | Status: AC
  Administered 2015-05-30: 5 mg via INTRAVENOUS
  Filled 2015-05-30: qty 2

## 2015-05-30 MED ORDER — PROMETHAZINE HCL 25 MG/ML IJ SOLN
25.0000 mg | Freq: Once | INTRAMUSCULAR | Status: AC
  Administered 2015-05-30: 25 mg via INTRAVENOUS
  Filled 2015-05-30: qty 1

## 2015-05-30 MED ORDER — ONDANSETRON 4 MG PO TBDP
4.0000 mg | ORAL_TABLET | Freq: Once | ORAL | Status: AC | PRN
  Administered 2015-05-30: 4 mg via ORAL
  Filled 2015-05-30: qty 1

## 2015-05-30 MED ORDER — ONDANSETRON HCL 4 MG/2ML IJ SOLN
4.0000 mg | Freq: Once | INTRAMUSCULAR | Status: AC
  Administered 2015-05-30: 4 mg via INTRAVENOUS
  Filled 2015-05-30: qty 2

## 2015-05-30 NOTE — ED Provider Notes (Addendum)
CSN: [id number]     Arrival date & time 05/30/15  1327 History   First MD Initiated Contact with Patient 05/30/15 1603     Chief Complaint  Patient presents with  . Abdominal Pain  . Nausea     (Consider location/radiation/quality/duration/timing/severity/associated sxs/prior Treatment) HPI  Tina Colon Is a 34 year old female who is here for her third visit in the past 4 days for epigastric abdominal pain, nausea and vomiting. She's had  negative workups including CT abdomen. Yesterday her Lipase was elevated to 195 and she was diagnosed as having acute pancreatitis.. She has a history in her chart of alcohol abuse. However, she denies any alcohol use since last Thursday. She also states she doesn't drink more than one beer daily. Denies history of binge drinking. The patient does acknowledge daily marijuana use since her late teens. She states that she has had previous self-limited episodes of severe epigastric pain with nausea and vomiting in the past. However, this has lasted the longest. She's been unable to hold down her medications at home.  Denies fevers, chills, myalgias, arthralgias. Denies DOE, SOB, chest tightness or pressure, radiation to left arm, jaw or back, or diaphoresis. Denies dysuria, flank pain, suprapubic pain, frequency, urgency, or hematuria. Denies headaches, light headedness, weakness, visual disturbances.  Past Medical History  Diagnosis Date  . ETOH abuse   . Gastritis    History reviewed. No pertinent past surgical history. History reviewed. No pertinent family history. Social History  Substance Use Topics  . Smoking status: Former Games developer  . Smokeless tobacco: Never Used  . Alcohol Use: Yes     Comment: daily use- wine and beer   OB History    No data available     Review of Systems  Ten systems reviewed and are negative for acute change, except as noted in the HPI.    Allergies  Penicillins  Home Medications   Prior to Admission  medications   Medication Sig Start Date End Date Taking? Authorizing Provider  Calcium Carbonate (CALCIUM 600 PO) Take 600 mg by mouth daily.    Historical Provider, MD  HYDROcodone-acetaminophen (NORCO/VICODIN) 5-325 MG tablet Take 2 tablets by mouth every 4 (four) hours as needed for moderate pain. 05/28/15   Gilda Crease, MD  Multiple Vitamin (MULTIVITAMIN WITH MINERALS) TABS tablet Take 1 tablet by mouth daily.    Historical Provider, MD  ondansetron (ZOFRAN) 4 MG tablet Take 1 tablet (4 mg total) by mouth every 4 (four) hours as needed for nausea or vomiting. 05/29/15   Arby Barrette, MD  pantoprazole (PROTONIX) 20 MG tablet Take 1 tablet (20 mg total) by mouth daily. 05/28/15   Gilda Crease, MD  potassium chloride SA (K-DUR,KLOR-CON) 20 MEQ tablet Take 2 tablets (40 mEq total) by mouth daily. 05/29/15   Arby Barrette, MD  promethazine (PHENERGAN) 25 MG tablet Take 1 tablet (25 mg total) by mouth every 6 (six) hours as needed for nausea or vomiting. 05/28/15   Gilda Crease, MD  sucralfate (CARAFATE) 1 GM/10ML suspension Take 10 mLs (1 g total) by mouth 4 (four) times daily -  with meals and at bedtime. 05/28/15   Gilda Crease, MD   BP 148/96 mmHg  Pulse 64  Temp(Src) 98.3 F (36.8 C) (Oral)  Resp 18  SpO2 97%  LMP 05/27/2015 Physical Exam  Constitutional: She is oriented to person, place, and time. She appears well-developed and well-nourished. No distress.  Appears uncomfortable  HENT:  Head: Normocephalic and  atraumatic.  Eyes: Conjunctivae are normal. No scleral icterus.  Neck: Normal range of motion.  Cardiovascular: Normal rate, regular rhythm and normal heart sounds.  Exam reveals no gallop and no friction rub.   No murmur heard. Pulmonary/Chest: Effort normal and breath sounds normal. No respiratory distress.  Abdominal: Soft. Bowel sounds are normal. She exhibits no distension and no mass. There is tenderness. There is no guarding.     Neurological: She is alert and oriented to person, place, and time.  Skin: Skin is warm and dry. She is not diaphoretic.  Nursing note and vitals reviewed.   ED Course  Procedures (including critical care time) Labs Review Labs Reviewed - No data to display  Imaging Review Ct Abdomen Pelvis W Contrast  05/29/2015  CLINICAL DATA:  Nausea, vomiting and epigastric pain for 4 days. EXAM: CT ABDOMEN AND PELVIS WITH CONTRAST TECHNIQUE: Multidetector CT imaging of the abdomen and pelvis was performed using the standard protocol following bolus administration of intravenous contrast. CONTRAST:  OMNIPAQUE IOHEXOL 300 MG/ML  SOLN COMPARISON:  CT scan 09/07/2009. FINDINGS: Lower chest: The lung bases are clear. No pleural effusion or pulmonary nodules. The heart is upper limits of normal in size. No pericardial effusion. The distal esophagus is grossly normal. Hepatobiliary: No focal hepatic lesions or intrahepatic biliary dilatation. The gallbladder is normal. No common bile duct dilatation. Pancreas: No mass, inflammation or ductal dilatation. Spleen: Normal size.  No focal lesions. Adrenals/Urinary Tract: The adrenal glands are normal. Both kidneys are normal. Stomach/Bowel: The stomach, duodenum, small bowel and colon are unremarkable. No inflammatory changes, mass lesions or obstructive findings. The terminal ileum is normal. The appendix is normal. Vascular/Lymphatic: No mesenteric or retroperitoneal mass or adenopathy. Small scattered lymph nodes are noted. The aorta is normal. The branch vessels are normal. The major venous structures are normal. Reproductive: The uterus and ovaries are normal. Other: No pelvic mass or adenopathy. No free pelvic fluid collections. The bladder appears normal. No inguinal mass or adenopathy. Musculoskeletal: No significant bony findings. 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  IMPRESSION: No acute abdominal/ pelvic findings, mass lesions or lymphadenopathy. Electronically Signed   By: Rudie Meyer M.D.   On: 05/29/2015 19:42   I have personally reviewed and evaluated these images and lab results as part of my medical decision-making.   EKG Interpretation None      MDM   Final diagnoses:  Epigastric abdominal pain  Non-intractable cyclical vomiting with nausea  Essential hypertension  Hypokalemia    4:43 PM BP 174/102 mmHg  Pulse 57  Temp(Src) 98.3 F (36.8 C) (Oral)  Resp 18  SpO2 100%  LMP 05/27/2015 Patient with burning epigastric pain, retching, vomiting. 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  8:26 PM Patient pain and nausea resolved. She is tolerating PO fluids. I have spoken to her of her need to discontinue etoh and marijuana. Follow up with GI. Phenergan suppositories at discharge  Arthor Captain, PA-C 05/30/15 2027  Laurence Spates, MD 05/31/15 1602  Arthor Captain, PA-C 06/20/15 1519  Laurence Spates, MD 06/24/15 [phone]

## 2015-05-30 NOTE — ED Notes (Signed)
Pt does not feel able to give Ua as she has been throwing up for a few days

## 2015-05-30 NOTE — ED Notes (Signed)
Pt seen and treated in ED on 12/18 and 12/20 for same complaint. Pt reports abd pain and vomiting. Pt loudly dry heaved at least 5 times while rn triaging pt. No vomit actually produced. Pt reports pain 10/10.   Will not order labs at this time since they were all done yesterday.

## 2015-05-30 NOTE — ED Notes (Addendum)
RN Harriett Sine starting IV and drawing labs

## 2015-05-30 NOTE — Discharge Instructions (Signed)
Abdominal (belly) pain can be caused by many things. Your caregiver performed an examination and possibly ordered blood/urine tests and imaging (CT scan, x-rays, ultrasound). Many cases can be observed and treated at home after initial evaluation in the emergency department. Even though you are being discharged home, abdominal pain can be unpredictable. Therefore, you need a repeated exam if your pain does not resolve, returns, or worsens. Most patients with abdominal pain don't have to be admitted to the hospital or have surgery, but serious problems like appendicitis and gallbladder attacks can start out as nonspecific pain. Many abdominal conditions cannot be diagnosed in one visit, so follow-up evaluations are very important. °SEEK IMMEDIATE MEDICAL ATTENTION IF: °The pain does not go away or becomes severe.  °A temperature above 101 develops.  °Repeated vomiting occurs (multiple episodes).  °The pain becomes localized to portions of the abdomen. The right side could possibly be appendicitis. In an adult, the left lower portion of the abdomen could be colitis or diverticulitis.  °Blood is being passed in stools or vomit (bright red or black tarry stools).  °Return also if you develop chest pain, difficulty breathing, dizziness or fainting, or become confused, poorly responsive, or inconsolable (young children). ° °Abdominal Pain, Adult °Many things can cause abdominal pain. Usually, abdominal pain is not caused by a disease and will improve without treatment. It can often be observed and treated at home. Your health care provider will do a physical exam and possibly order blood tests and X-rays to help determine the seriousness of your pain. However, in many cases, more time must pass before a clear cause of the pain can be found. Before that point, your health care provider may not know if you need more testing or further treatment. °HOME CARE INSTRUCTIONS °Monitor your abdominal pain for any changes. The  following actions may help to alleviate any discomfort you are experiencing: °· Only take over-the-counter or prescription medicines as directed by your health care provider. °· Do not take laxatives unless directed to do so by your health care provider. °· Try a clear liquid diet (broth, tea, or water) as directed by your health care provider. Slowly move to a bland diet as tolerated. °SEEK MEDICAL CARE IF: °· You have unexplained abdominal pain. °· You have abdominal pain associated with nausea or diarrhea. °· You have pain when you urinate or have a bowel movement. °· You experience abdominal pain that wakes you in the night. °· You have abdominal pain that is worsened or improved by eating food. °· You have abdominal pain that is worsened with eating fatty foods. °· You have a fever. °SEEK IMMEDIATE MEDICAL CARE IF: °· Your pain does not go away within 2 hours. °· You keep throwing up (vomiting). °· Your pain is felt only in portions of the abdomen, such as the right side or the left lower portion of the abdomen. °· You pass bloody or black tarry stools. °MAKE SURE YOU: °· Understand these instructions. °· Will watch your condition. °· Will get help right away if you are not doing well or get worse. °  °This information is not intended to replace advice given to you by your health care provider. Make sure you discuss any questions you have with your health care provider. °  °Document Released: 03/05/2005 Document Revised: 02/14/2015 Document Reviewed: 02/02/2013 °[employer] Interactive Patient Education ©2016 [employer] ° °

## 2015-06-05 ENCOUNTER — Encounter: Payer: Self-pay | Admitting: Physician Assistant

## 2015-06-05 ENCOUNTER — Ambulatory Visit (INDEPENDENT_AMBULATORY_CARE_PROVIDER_SITE_OTHER): Payer: BLUE CROSS/BLUE SHIELD | Admitting: Physician Assistant

## 2015-06-05 VITALS — BP 109/75 | HR 97 | Temp 98.9°F | Resp 16 | Ht 62.0 in | Wt 168.0 lb

## 2015-06-05 DIAGNOSIS — K2970 Gastritis, unspecified, without bleeding: Secondary | ICD-10-CM | POA: Diagnosis not present

## 2015-06-05 DIAGNOSIS — K859 Acute pancreatitis without necrosis or infection, unspecified: Secondary | ICD-10-CM | POA: Diagnosis not present

## 2015-06-05 DIAGNOSIS — F1210 Cannabis abuse, uncomplicated: Secondary | ICD-10-CM

## 2015-06-05 DIAGNOSIS — E876 Hypokalemia: Secondary | ICD-10-CM

## 2015-06-05 DIAGNOSIS — R7309 Other abnormal glucose: Secondary | ICD-10-CM

## 2015-06-05 DIAGNOSIS — F1010 Alcohol abuse, uncomplicated: Secondary | ICD-10-CM

## 2015-06-05 LAB — COMPREHENSIVE METABOLIC PANEL
ALBUMIN: 4 g/dL (ref 3.6–5.1)
ALT: 20 U/L (ref 6–29)
AST: 13 U/L (ref 10–30)
Alkaline Phosphatase: 45 U/L (ref 33–115)
BUN: 9 mg/dL (ref 7–25)
CALCIUM: 9.5 mg/dL (ref 8.6–10.2)
CHLORIDE: 103 mmol/L (ref 98–110)
CO2: 26 mmol/L (ref 20–31)
Creat: 0.76 mg/dL (ref 0.50–1.10)
Glucose, Bld: 89 mg/dL (ref 65–99)
POTASSIUM: 3.8 mmol/L (ref 3.5–5.3)
SODIUM: 138 mmol/L (ref 135–146)
TOTAL PROTEIN: 6.5 g/dL (ref 6.1–8.1)
Total Bilirubin: 0.3 mg/dL (ref 0.2–1.2)

## 2015-06-05 LAB — AMYLASE: Amylase: 51 U/L (ref 0–105)

## 2015-06-05 LAB — LIPASE: Lipase: 33 U/L (ref 7–60)

## 2015-06-05 LAB — HEMOGLOBIN A1C
HEMOGLOBIN A1C: 5.4 % (ref <5.7)
MEAN PLASMA GLUCOSE: 108 mg/dL (ref <117)

## 2015-06-05 NOTE — Progress Notes (Signed)
[facility] [address], [place] [address] [phone]  Date:  06/05/2015   Name:  Tina Colon   DOB:  Mar 11, 1981   MRN:  [id number]  PCP:  No PCP Per Patient    Chief Complaint: Abdominal Pain; Nausea; and Emesis   History of Present Illness:  This is a 34 y.o. female with PMH gastritis who is presenting for hospital follow up. She was seen in the ED 12/18, 12/20, 12/21 for n/v and epigastric abdominal pain. She was initially dx'd with gastritis. First visit, lipase normal. 2nd visit lipase elevated to 195. Interestingly, third visit lipase return to normal. Other lab testing included: UA with large hgb only, neg hcg, nml cbc, cmp with hypokalemia to 3.1 but otherwise nml, urine tox screen positive for cannabis. Abdominal CT negative. She was discharged on protonix 20 mg, zofran 4 mg and told to eat a bland diet. She was advised to stop etoh and cannabis. Pt states she usually has 2-6 beers a day and smokes 2 cigars with marijuana in it. She states since her symptoms began she has not had any etoh or marijuana. She states the zofran is helping her nausea. She has not vomited in 4 days. Her abdominal pain is slowly improving, still with some mild pain. No diarrhea. She has not been taking the protonix consistently. She has been gradually advancing her diet, still eating very bland, but able to eat solids now. Denies fever, chills.  Looking back over the past couple years, pt was seen in ED 10/2014 for gastritis. She was seen several times in ED October of 2014 for abdominal pain, n/v.  Pt was given phone number for [facility] to make appt. She called - earliest she could get appt was mid February. She states she does not want to go to [facility] due to a bad experience in the past.  Review of Systems:  Review of Systems See HPI  There are no active problems to display for this patient.   Prior to Admission medications   Medication Sig Start Date End Date  Taking? Authorizing Provider  Calcium Carbonate (CALCIUM 600 PO) Take 600 mg by mouth daily.   Yes Historical Provider, MD  HYDROcodone-acetaminophen (NORCO/VICODIN) 5-325 MG tablet Take 2 tablets by mouth every 4 (four) hours as needed for moderate pain. 05/28/15  Yes Gilda Crease, MD         ondansetron (ZOFRAN) 4 MG tablet Take 1 tablet (4 mg total) by mouth every 4 (four) hours as needed for nausea or vomiting. 05/29/15  yes Arby Barrette, MD  pantoprazole (PROTONIX) 20 MG tablet Take 1 tablet (20 mg total) by mouth daily. 05/28/15  yes Gilda Crease, MD  potassium chloride SA (K-DUR,KLOR-CON) 20 MEQ tablet Take 2 tablets (40 mEq total) by mouth daily. 05/29/15  yes Arby Barrette, MD           Allergies  Allergen Reactions  . Penicillins Rash    Has patient had a PCN reaction causing immediate rash, facial/tongue/throat swelling, SOB or lightheadedness with hypotension:Yes Has patient had a PCN reaction causing severe rash involving mucus membranes or skin necrosis:unknown Has patient had a PCN reaction that required hospitalization:Yes Has patient had a PCN reaction occurring within the last 10 years:No If all of the above answers are "NO", then may proceed with Cephalosporin use.     No past surgical history on file.  Social History  Substance Use Topics  . Smoking  status: Former Games developer  . Smokeless tobacco: Never Used  . Alcohol Use: Yes     Comment: daily use- wine and beer    No family history on file.  Medication list has been reviewed and updated.  Physical Examination:  Physical Exam  Constitutional: She is oriented to person, place, and time. She appears well-developed and well-nourished. No distress.  HENT:  Head: Normocephalic and atraumatic.  Right Ear: Hearing normal.  Left Ear: Hearing normal.  Nose: Nose normal.  Mouth/Throat: Uvula is midline, oropharynx is clear and moist and mucous membranes are normal.  Eyes: Conjunctivae and lids  are normal. Right eye exhibits no discharge. Left eye exhibits no discharge. No scleral icterus.  Cardiovascular: Normal rate, regular rhythm, normal heart sounds and normal pulses.   No murmur heard. Pulmonary/Chest: Effort normal and breath sounds normal. No respiratory distress. She has no wheezes. She has no rhonchi. She has no rales.  Abdominal: Soft. Normal appearance. There is tenderness (mild) in the epigastric area.  Musculoskeletal: Normal range of motion.  Lymphadenopathy:    She has no cervical adenopathy.  Neurological: She is alert and oriented to person, place, and time.  Skin: Skin is warm, dry and intact. No lesion and no rash noted.  Psychiatric: She has a normal mood and affect. Her speech is normal and behavior is normal. Thought content normal.   BP 109/75 mmHg  Pulse 97  Temp(Src) 98.9 F (37.2 C)  Resp 16  Ht  (1.575 m)  Wt 168 lb (76.204 kg)  BMI 30.72 kg/m2  LMP 05/27/2015  Results for orders placed or performed in visit on 06/05/15  Lipase  Result Value Ref Range   Lipase 33 7 - 60 U/L  Amylase  Result Value Ref Range   Amylase 51 0 - 105 U/L  Comprehensive metabolic panel  Result Value Ref Range   Sodium 138 135 - 146 mmol/L   Potassium 3.8 3.5 - 5.3 mmol/L   Chloride 103 98 - 110 mmol/L   CO2 26 20 - 31 mmol/L   Glucose, Bld 89 65 - 99 mg/dL   BUN 9 7 - 25 mg/dL   Creat 6.96 2.95 - 2.84 mg/dL   Total Bilirubin 0.3 0.2 - 1.2 mg/dL   Alkaline Phosphatase 45 33 - 115 U/L   AST 13 10 - 30 U/L   ALT 20 6 - 29 U/L   Total Protein 6.5 6.1 - 8.1 g/dL   Albumin 4.0 3.6 - 5.1 g/dL   Calcium 9.5 8.6 - 13.2 mg/dL  Hemoglobin G4W  Result Value Ref Range   Hgb A1c MFr Bld 5.4 <5.7 %   Mean Plasma Glucose 108 <117 mg/dL    Assessment and Plan:  1. Acute pancreatitis, unspecified pancreatitis type 2. Gastritis 3. Alcohol abuse 4. Cannabis abuse Lipase only elevated for 1 day, then back to normal the following day. Abdominal CT normal. Possible  acute pancreatitis, esp in setting of alcohol use. Could also be lab error? Rechecked lipase as well as amylase and normal. CMP normal. I would favor gastritis being the cause. She is getting better with bland diet and zofran. I advised she take protonix daily and stay away from etoh and marijuana. She has not used since before she got sick and states she plans to stay away. Referred to GI - hopefully can get her a sooner appt but may be hard since she doesn't want to be seen at [facility] and apparently [place] recently lost 2 of  their providers. Return as needed. - Lipase - Amylase - Comprehensive metabolic panel - Ambulatory referral to Gastroenterology  5. Elevated glucose A1C normal. Glucose in normal range on CMP. - Hemoglobin A1c  6. Hypokalemia  Potassium in normal range now. She states she get hypokalemic easily when she is sick. She has potassium tabs to take if she gets sick again.   Roswell Miners Dyke Brackett, MHS [facility]  06/07/2015

## 2015-06-05 NOTE — Patient Instructions (Addendum)
Fax number [phone] zofran as needed for nausea You will get a phone call to make appt for GI - hopefully in January Take protonix daily. Gradually advance your diet. Stay away from nsaids (ie ibuprofen), acidic food, alcohol, marijuana. Fax your FMLA

## 2015-06-07 ENCOUNTER — Encounter: Payer: Self-pay | Admitting: Physician Assistant

## 2015-06-07 DIAGNOSIS — K2970 Gastritis, unspecified, without bleeding: Secondary | ICD-10-CM | POA: Insufficient documentation

## 2015-06-07 DIAGNOSIS — F1210 Cannabis abuse, uncomplicated: Secondary | ICD-10-CM | POA: Insufficient documentation

## 2015-06-07 DIAGNOSIS — F1010 Alcohol abuse, uncomplicated: Secondary | ICD-10-CM | POA: Insufficient documentation

## 2015-06-14 ENCOUNTER — Encounter: Payer: Self-pay | Admitting: Physician Assistant

## 2015-06-14 ENCOUNTER — Ambulatory Visit (INDEPENDENT_AMBULATORY_CARE_PROVIDER_SITE_OTHER): Payer: BLUE CROSS/BLUE SHIELD | Admitting: Physician Assistant

## 2015-06-14 ENCOUNTER — Other Ambulatory Visit (INDEPENDENT_AMBULATORY_CARE_PROVIDER_SITE_OTHER): Payer: BLUE CROSS/BLUE SHIELD

## 2015-06-14 VITALS — BP 118/80 | HR 88 | Ht 61.02 in | Wt 177.1 lb

## 2015-06-14 DIAGNOSIS — R1013 Epigastric pain: Secondary | ICD-10-CM | POA: Diagnosis not present

## 2015-06-14 DIAGNOSIS — R1310 Dysphagia, unspecified: Secondary | ICD-10-CM

## 2015-06-14 DIAGNOSIS — K859 Acute pancreatitis without necrosis or infection, unspecified: Secondary | ICD-10-CM

## 2015-06-14 DIAGNOSIS — K589 Irritable bowel syndrome without diarrhea: Secondary | ICD-10-CM

## 2015-06-14 LAB — HEPATIC FUNCTION PANEL
ALT: 21 U/L (ref 0–35)
AST: 18 U/L (ref 0–37)
Albumin: 4.3 g/dL (ref 3.5–5.2)
Alkaline Phosphatase: 39 U/L (ref 39–117)
BILIRUBIN DIRECT: 0.1 mg/dL (ref 0.0–0.3)
BILIRUBIN TOTAL: 0.4 mg/dL (ref 0.2–1.2)
Total Protein: 6.9 g/dL (ref 6.0–8.3)

## 2015-06-14 LAB — CBC WITH DIFFERENTIAL/PLATELET
BASOS ABS: 0.1 10*3/uL (ref 0.0–0.1)
Basophils Relative: 1.2 % (ref 0.0–3.0)
EOS ABS: 0.1 10*3/uL (ref 0.0–0.7)
Eosinophils Relative: 1.5 % (ref 0.0–5.0)
HEMATOCRIT: 38.9 % (ref 36.0–46.0)
Hemoglobin: 13.1 g/dL (ref 12.0–15.0)
Lymphocytes Relative: 30.7 % (ref 12.0–46.0)
Lymphs Abs: 2 10*3/uL (ref 0.7–4.0)
MCHC: 33.6 g/dL (ref 30.0–36.0)
MCV: 90.8 fl (ref 78.0–100.0)
Monocytes Absolute: 0.4 10*3/uL (ref 0.1–1.0)
Monocytes Relative: 6.3 % (ref 3.0–12.0)
NEUTROS PCT: 60.3 % (ref 43.0–77.0)
Neutro Abs: 4 10*3/uL (ref 1.4–7.7)
PLATELETS: 235 10*3/uL (ref 150.0–400.0)
RBC: 4.28 Mil/uL (ref 3.87–5.11)
RDW: 13.2 % (ref 11.5–15.5)
WBC: 6.6 10*3/uL (ref 4.0–10.5)

## 2015-06-14 LAB — LIPASE: LIPASE: 22 U/L (ref 11.0–59.0)

## 2015-06-14 LAB — AMYLASE: Amylase: 44 U/L (ref 27–131)

## 2015-06-14 MED ORDER — HYOSCYAMINE SULFATE 0.125 MG SL SUBL: 0.1250 mg | SUBLINGUAL_TABLET | Freq: Four times a day (QID) | SUBLINGUAL | Status: DC | PRN

## 2015-06-14 NOTE — Patient Instructions (Signed)
You have been scheduled for an endoscopy. Please follow written instructions given to you at your visit today. If you use inhalers (even only as needed), please bring them with you on the day of your procedure. Your physician has requested that you go to www.startemmi.com and enter the access code given to you at your visit today. This web site gives a general overview about your procedure. However, you should still follow specific instructions given to you by our office regarding your preparation for the procedure.  Continue Protonix daily.  We have sent the medication to your pharmacy for you to pick up at your convenience: Levsin  Your physician has requested that you go to the basement for lab work before leaving today.  Avoid alcohol.

## 2015-06-14 NOTE — Progress Notes (Signed)
Patient ID: Tina Colon, female   DOB: 1981/01/16, 35 y.o.   MRN: [id number]    HPI:  Tina Colon is a 35 y.o.   female  referred by Dorna Leitz, PA-C  For evaluation of epigastric pain. Edward has a history of gastritis in the past. She has been seen in the emergency room on December 18, 20th, and 21st for epigastric abdominal pain associated with nausea and vomiting. At first she was diagnosed with gastritis. On her first visit her lipase was normal, second visit her lipase was noted to be elevated to 195, and on the third visit had had returned to normal. She reports that she was diagnosed with gastritis in the emergency room in 2015 as well. She does report that she gets occasional heartburn she has been experiencing intermittent dysphagia to solids for about a year or so. She denies use of nonsteroidal anti-inflammatories. She denies use of spicy foods. She does eat fried foods. She had been drinking 2-6 beers daily but discontinued this practice 3-3-1/2 weeks ago. She denies use of tobacco. She is concerned about her epigastric pain because she states her maternal grandfather had stomach cancer. Her father is alive at age 39 but has a history of recurrent ulcers and gastritis.  She has tried smoking 2 cigars with marijuana to decrease her nausea but it has not helped. She has had relief of her nausea with Zofran.   she also reports that she frequently has left lower quadrant pain that comes and goes. This past week she was very constipated. She reports that she typically has days of formed hard nugget-like stools alternating with loose stools. She has no bright red blood per rectum or melena. She reports that she eats , gets crampy, and has a bowel movement. When she moves her bowels, her pain is better. Her appetite has been good and she's had no weight loss.   Past Medical History  Diagnosis Date  . ETOH abuse   . Gastritis   . Pancreatitis   . GERD (gastroesophageal reflux disease)       Past Surgical History  Procedure Laterality Date  . Wisdom tooth extraction     Family History  Problem Relation Age of Onset  . Stomach cancer Maternal Grandfather   . Diabetes    . Heart disease    . Irritable bowel syndrome     Social History  Substance Use Topics  . Smoking status: Former Games developer  . Smokeless tobacco: Never Used  . Alcohol Use: Yes     Comment: daily use- wine and beer   Current Outpatient Prescriptions  Medication Sig Dispense Refill  . Calcium Carbonate (CALCIUM 600 PO) Take 600 mg by mouth daily.    . Multiple Vitamin (MULTIVITAMIN WITH MINERALS) TABS tablet Take 1 tablet by mouth daily.    . ondansetron (ZOFRAN) 4 MG tablet Take 1 tablet (4 mg total) by mouth every 4 (four) hours as needed for nausea or vomiting. 20 tablet 0  . pantoprazole (PROTONIX) 20 MG tablet Take 1 tablet (20 mg total) by mouth daily. 30 tablet 2  . promethazine (PHENERGAN) 25 MG tablet Take 1 tablet (25 mg total) by mouth every 6 (six) hours as needed for nausea or vomiting. 30 tablet 0  . hyoscyamine (LEVSIN SL) 0.125 MG SL tablet Place 1 tablet (0.125 mg total) under the tongue every 6 (six) hours as needed. 120 tablet 1   No current facility-administered medications for this visit.  Allergies  Allergen Reactions  . Penicillins Rash    Has patient had a PCN reaction causing immediate rash, facial/tongue/throat swelling, SOB or lightheadedness with hypotension:Yes Has patient had a PCN reaction causing severe rash involving mucus membranes or skin necrosis:unknown Has patient had a PCN reaction that required hospitalization:Yes Has patient had a PCN reaction occurring within the last 10 years:No If all of the above answers are "NO", then may proceed with Cephalosporin use.      Review of Systems: Gen: Denies any fever, chills, sweats, anorexia, fatigue, weakness, malaise, weight loss, and sleep disorder CV: Denies chest pain, angina, palpitations, syncope, orthopnea,  PND, peripheral edema, and claudication. Resp: Denies dyspnea at rest, dyspnea with exercise, cough, sputum, wheezing, coughing up blood, and pleurisy. GI: Denies vomiting blood, jaundice, and fecal incontinence.   Denies dysphagia or odynophagia. GU : Denies urinary burning, blood in urine, urinary frequency, urinary hesitancy, nocturnal urination, and urinary incontinence. MS: Denies joint pain, limitation of movement, and swelling, stiffness, low back pain, extremity pain. Denies muscle weakness, cramps, atrophy.  Derm: Denies rash, itching, dry skin, hives, moles, warts, or unhealing ulcers.  Psych: Denies depression, anxiety, memory loss, suicidal ideation, hallucinations, paranoia, and confusion. Heme: Denies bruising, bleeding, and enlarged lymph nodes. Neuro:  Denies any headaches, dizziness, paresthesias. Endo:  Denies any problems with DM, thyroid, adrenal function  Studies: Ct Abdomen Pelvis W Contrast  05/29/2015  CLINICAL DATA:  Nausea, vomiting and epigastric pain for 4 days. EXAM: CT ABDOMEN AND PELVIS WITH CONTRAST TECHNIQUE: Multidetector CT imaging of the abdomen and pelvis was performed using the standard protocol following bolus administration of intravenous contrast. CONTRAST:  OMNIPAQUE IOHEXOL 300 MG/ML  SOLN COMPARISON:  CT scan 09/07/2009. FINDINGS: Lower chest: The lung bases are clear. No pleural effusion or pulmonary nodules. The heart is upper limits of normal in size. No pericardial effusion. The distal esophagus is grossly normal. Hepatobiliary: No focal hepatic lesions or intrahepatic biliary dilatation. The gallbladder is normal. No common bile duct dilatation. Pancreas: No mass, inflammation or ductal dilatation. Spleen: Normal size.  No focal lesions. Adrenals/Urinary Tract: The adrenal glands are normal. Both kidneys are normal. Stomach/Bowel: The stomach, duodenum, small bowel and colon are unremarkable. No inflammatory changes, mass lesions or obstructive  findings. The terminal ileum is normal. The appendix is normal. Vascular/Lymphatic: No mesenteric or retroperitoneal mass or adenopathy. Small scattered lymph nodes are noted. The aorta is normal. The branch vessels are normal. The major venous structures are normal. Reproductive: The uterus and ovaries are normal. Other: No pelvic mass or adenopathy. No free pelvic fluid collections. The bladder appears normal. No inguinal mass or adenopathy. Musculoskeletal: No significant bony findings. 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 IMPRESSION: No acute abdominal/ pelvic findings, mass lesions or lymphadenopathy. Electronically Signed   By: Rudie Meyer M.D.   On: 05/29/2015 19:42    LAB RESULTS:  laboratory studies 05/28/2015 showed alkaline phosphatase 43 lipase 26, AST 23, ALT 17,   labs 05/29/2015 showed alkaline phosphatase 48, lipase 195, AST 19, ALT 18 , total bili 0.8.  labs 05/30/2015 showed alkaline phosphatase 45, lipase 26, AST 24, ALT 21, total bili 0.3.   Physical Exam: BP 118/80 mmHg  Pulse 88  Ht 5' 1.02" (1.55 m)  Wt 177 lb 2 oz (80.343 kg)  BMI 33.44 kg/m2  LMP 05/27/2015 Constitutional: Pleasant,well-developed, female in no acute distress. HEENT: Normocephalic and atraumatic. Conjunctivae are  normal. No scleral icterus. Neck supple.  Cardiovascular: Normal rate, regular rhythm.  Pulmonary/chest: Effort normal and breath sounds normal. No wheezing, rales or rhonchi. Abdominal: Soft, nondistended,  Mild epigastric tenderness to palpation with no rebound or guarding. Bowel sounds active throughout. There are no masses palpable. No hepatomegaly. Extremities: no edema Lymphadenopathy: No cervical adenopathy noted. Neurological: Alert and oriented to person place and time. Skin: Skin is warm and dry. No rashes noted. Psychiatric: Normal mood and affect. Behavior is normal.  ASSESSMENT AND  PLAN:  35 year old female with a history of GERD, gastritis, alcohol abuse , marijuana abuse, recently evaluated in the emergency room 3 for epigastric pain , nausea, and vomiting, referred for evaluation. Lipase was noted to be elevated for 1 day but then return to normal. Abdominal CT nonrevealing. Patient may have had mild pancreatitis. She most likely has ongoing gastritis. She's been instructed to abstain from alcohol and to decrease ingestion of junk food. She will be given a trial of Protonix 40 mg by mouth every morning. She will be scheduled for an EGD to evaluate for esophagitis, gastritis, ulcer, duodenitis etc.The risks, benefits, and alternatives to endoscopy with possible biopsy and possible dilation were discussed with the patient and they consent to proceed.   The procedure will be scheduled with Dr. Lavon Paganini.  A CBC with differential, amylase, lipase, and hepatic function panel will be obtained today as well.    With regards to her intermittent left lower quadrant pain, it is likely functional in nature/IBS. She's been instructed to adhere to a high-fiber low-fat diet, and she will be given a trial of Levsin sublingual 0.125 mg, one sublingually every 6 hours when necessary cramps.   Further recommendations will be made pending the findings of the above.    Hvozdovic, Moise Boring 06/14/2015, 4:37 PM  CC: Dorna Leitz, PA-C

## 2015-06-15 ENCOUNTER — Encounter: Payer: Self-pay | Admitting: *Deleted

## 2015-06-18 NOTE — Progress Notes (Signed)
Reviewed and agree with documentation and assessment and plan. K. Veena Nandigam , MD   

## 2015-06-19 ENCOUNTER — Ambulatory Visit (AMBULATORY_SURGERY_CENTER): Payer: BLUE CROSS/BLUE SHIELD | Admitting: Gastroenterology

## 2015-06-19 ENCOUNTER — Encounter: Payer: Self-pay | Admitting: Gastroenterology

## 2015-06-19 VITALS — BP 112/60 | HR 73 | Temp 97.8°F | Resp 23 | Ht 61.0 in | Wt 177.0 lb

## 2015-06-19 DIAGNOSIS — R1013 Epigastric pain: Secondary | ICD-10-CM

## 2015-06-19 HISTORY — PX: ESOPHAGOGASTRODUODENOSCOPY: SHX1529

## 2015-06-19 MED ORDER — SODIUM CHLORIDE 0.9 % IV SOLN: 500.0000 mL | INTRAVENOUS | Status: DC

## 2015-06-19 NOTE — Progress Notes (Signed)
Patient awakening,vss,report to rn 

## 2015-06-19 NOTE — Op Note (Signed)
[facility] [address] [place], [address]   ENDOSCOPY PROCEDURE REPORT  PATIENT: Tina Colon, Tina Colon  MR#: [id number] BIRTHDATE: 10/22/1980 , 34  yrs. old GENDER: female ENDOSCOPIST: Marsa Aris, MD REFERRED BY:  Lanier Clam PA PROCEDURE DATE:  06/19/2015 PROCEDURE:  EGD, diagnostic and EGD w/ biopsy for H.pylori ASA CLASS:     Class II INDICATIONS:  diagnostic procedure, epigastric abdominal pain, and dyspepsia. MEDICATIONS: Propofol 200 mg IV TOPICAL ANESTHETIC: none  DESCRIPTION OF PROCEDURE: After the risks benefits and alternatives of the procedure were thoroughly explained, informed consent was obtained.  The LB [id number] endoscope was introduced through the mouth and advanced to the second portion of the duodenum , Without limitations.  The instrument was slowly withdrawn as the mucosa was fully examined.   EXAM: The esophagus and gastroesophageal junction were completely normal in appearance.  The stomach was entered and closely examined.The antrum, angularis, and lesser curvature were well visualized, including a retroflexed view of the cardia and fundus. The stomach wall was normally distensable.  The scope passed easily through the pylorus into the duodenum.  Retroflexed views revealed no abnormalities.  random biopsies were obtained from gastric antrum and body  to rule out H. pylori. The scope was then withdrawn from the patient and the procedure completed.  COMPLICATIONS: There were no immediate complications.  ENDOSCOPIC IMPRESSION: Normal appearing esophagus and GE junction, the stomach was well visualized and normal in appearance, normal appearing duodenum  RECOMMENDATIONS: 1.  Await biopsy results 2.  Continue PPI  eSigned:  Marsa Aris, MD 06/19/2015 10:50 AM

## 2015-06-19 NOTE — Patient Instructions (Signed)
YOU HAD AN ENDOSCOPIC PROCEDURE TODAY AT THE [facility]:   Refer to the procedure report that was given to you for any specific questions about what was found during the examination.  If the procedure report does not answer your questions, please call your gastroenterologist to clarify.  If you requested that your care partner not be given the details of your procedure findings, then the procedure report has been included in a sealed envelope for you to review at your convenience later.  YOU SHOULD EXPECT: Some feelings of bloating in the abdomen. Passage of more gas than usual.  Walking can help get rid of the air that was put into your GI tract during the procedure and reduce the bloating. If you had a lower endoscopy (such as a colonoscopy or flexible sigmoidoscopy) you may notice spotting of blood in your stool or on the toilet paper. If you underwent a bowel prep for your procedure, you may not have a normal bowel movement for a few days.  Please Note:  You might notice some irritation and congestion in your nose or some drainage.  This is from the oxygen used during your procedure.  There is no need for concern and it should clear up in a day or so.  SYMPTOMS TO REPORT IMMEDIATELY:    Following upper endoscopy (EGD)  Vomiting of blood or coffee ground material  New chest pain or pain under the shoulder blades  Painful or persistently difficult swallowing  New shortness of breath  Fever of 100F or higher  Black, tarry-looking stools  For urgent or emergent issues, a gastroenterologist can be reached at any hour by calling [phone].   DIET: Your first meal following the procedure should be a small meal and then it is ok to progress to your normal diet. Heavy or fried foods are harder to digest and may make you feel nauseous or bloated.  Likewise, meals heavy in dairy and vegetables can increase bloating.  Drink plenty of fluids but you should avoid alcoholic beverages  for 24 hours.  ACTIVITY:  You should plan to take it easy for the rest of today and you should NOT DRIVE or use heavy machinery until tomorrow (because of the sedation medicines used during the test).    FOLLOW UP: Our staff will call the number listed on your records the next business day following your procedure to check on you and address any questions or concerns that you may have regarding the information given to you following your procedure. If we do not reach you, we will leave a message.  However, if you are feeling well and you are not experiencing any problems, there is no need to return our call.  We will assume that you have returned to your regular daily activities without incident.  If any biopsies were taken you will be contacted by phone or by letter within the next 1-3 weeks.  Please call us at [phone] if you have not heard about the biopsies in 3 weeks.    SIGNATURES/CONFIDENTIALITY: You and/or your care partner have signed paperwork which will be entered into your electronic medical record.  These signatures attest to the fact that that the information above on your After Visit Summary has been reviewed and is understood.  Full responsibility of the confidentiality of this discharge information lies with you and/or your care-partner. 

## 2015-06-19 NOTE — Progress Notes (Signed)
Called to room to assist during endoscopic procedure.  Patient ID and intended procedure confirmed with present staff. Received instructions for my participation in the procedure from the performing physician.  

## 2015-06-20 ENCOUNTER — Telehealth: Payer: Self-pay | Admitting: *Deleted

## 2015-06-20 NOTE — Telephone Encounter (Signed)
Left message on f/u callback 

## 2015-06-21 ENCOUNTER — Telehealth: Payer: Self-pay

## 2015-06-21 NOTE — Telephone Encounter (Signed)
Patient brought in FMLA foms to be completed by Lanier Clam, I have filled out what I can from the OV notes and highlighted whats needs to be completed. Please fill these out and return to the FMLA/Disability box at the 102 check out desk with in 5-7 business days. Patient stated that her employer told her that since she was out for a week straight that the back page of these forms needs to read as follows (4 times per month lastin 3 days) if this is not correct please fill in the correct amount of time. Thank you!

## 2015-06-25 NOTE — Telephone Encounter (Signed)
Forms received, faxed, and scanned. °

## 2015-06-25 NOTE — Telephone Encounter (Signed)
FMLA paperwork filled out and in [facility] box.

## 2015-06-25 NOTE — Telephone Encounter (Signed)
Called pt and left vm to discuss FMLA paperwork. Need to know how long she took off work due to illness. Also, this should be a treatable condition. I was not planning to give her FMLA for flare ups. I will give her FMLA for the time she missed work. If she has any questions, please let me know.

## 2015-06-28 ENCOUNTER — Telehealth: Payer: Self-pay | Admitting: Gastroenterology

## 2015-06-29 NOTE — Telephone Encounter (Signed)
Advised no H-Pylori. No dysplasia or malignancy. Also advised the report is waiting to be reviewed by her provider and she will be contacted again.

## 2015-07-02 ENCOUNTER — Encounter: Payer: Self-pay | Admitting: Gastroenterology

## 2015-07-09 ENCOUNTER — Telehealth: Payer: Self-pay | Admitting: Gastroenterology

## 2015-07-09 NOTE — Telephone Encounter (Signed)
After discussing the biopsy report with Dr Lavon Paganini, I advised the patient  She has just reactive gastropathy which is a nonspecific finding. She can continue the PPI for 2 months and see if her symptoms improve. She states her symptoms have already improved some.

## 2015-09-03 ENCOUNTER — Telehealth: Payer: Self-pay | Admitting: Gastroenterology

## 2015-09-03 MED ORDER — PANTOPRAZOLE SODIUM 20 MG PO TBEC: 20.0000 mg | DELAYED_RELEASE_TABLET | Freq: Every day | ORAL | Status: DC

## 2015-09-03 NOTE — Telephone Encounter (Signed)
Called patient to inform med sent to pharmacy  

## 2016-09-28 ENCOUNTER — Other Ambulatory Visit: Payer: Self-pay | Admitting: Gastroenterology

## 2016-09-30 ENCOUNTER — Telehealth: Payer: Self-pay | Admitting: Gastroenterology

## 2016-09-30 NOTE — Telephone Encounter (Signed)
protonix sent yesterday with 11 refills

## 2017-10-02 ENCOUNTER — Ambulatory Visit (INDEPENDENT_AMBULATORY_CARE_PROVIDER_SITE_OTHER): Payer: BLUE CROSS/BLUE SHIELD | Admitting: Emergency Medicine

## 2017-10-02 ENCOUNTER — Other Ambulatory Visit: Payer: Self-pay

## 2017-10-02 ENCOUNTER — Encounter: Payer: Self-pay | Admitting: Emergency Medicine

## 2017-10-02 VITALS — BP 116/76 | HR 84 | Temp 98.9°F | Resp 16 | Ht 60.5 in | Wt 178.8 lb

## 2017-10-02 DIAGNOSIS — Z1231 Encounter for screening mammogram for malignant neoplasm of breast: Secondary | ICD-10-CM | POA: Diagnosis not present

## 2017-10-02 DIAGNOSIS — Z0000 Encounter for general adult medical examination without abnormal findings: Secondary | ICD-10-CM | POA: Diagnosis not present

## 2017-10-02 NOTE — Patient Instructions (Addendum)
   IF you received an x-ray today, you will receive an invoice from [facility]. Please contact [facility] at [phone] with questions or concerns regarding your invoice.   IF you received labwork today, you will receive an invoice from [facility]. Please contact [facility] at [phone] with questions or concerns regarding your invoice.   Our billing staff will not be able to assist you with questions regarding bills from these companies.  You will be contacted with the lab results as soon as they are available. The fastest way to get your results is to activate your My Chart account. Instructions are located on the last page of this paperwork. If you have not heard from us regarding the results in 2 weeks, please contact this office.     Health Maintenance, Female Adopting a healthy lifestyle and getting preventive care can go a long way to promote health and wellness. Talk with your health care provider about what schedule of regular examinations is right for you. This is a good chance for you to check in with your provider about disease prevention and staying healthy. In between checkups, there are plenty of things you can do on your own. Experts have done a lot of research about which lifestyle changes and preventive measures are most likely to keep you healthy. Ask your health care provider for more information. Weight and diet Eat a healthy diet  Be sure to include plenty of vegetables, fruits, low-fat dairy products, and lean protein.  Do not eat a lot of foods high in solid fats, added sugars, or salt.  Get regular exercise. This is one of the most important things you can do for your health. ? Most adults should exercise for at least 150 minutes each week. The exercise should increase your heart rate and make you sweat (moderate-intensity exercise). ? Most adults should also do strengthening exercises at least twice a week. This is in addition to the  moderate-intensity exercise.  Maintain a healthy weight  Body mass index (BMI) is a measurement that can be used to identify possible weight problems. It estimates body fat based on height and weight. Your health care provider can help determine your BMI and help you achieve or maintain a healthy weight.  For females 20 years of age and older: ? A BMI below 18.5 is considered underweight. ? A BMI of 18.5 to 24.9 is normal. ? A BMI of 25 to 29.9 is considered overweight. ? A BMI of 30 and above is considered obese.  Watch levels of cholesterol and blood lipids  You should start having your blood tested for lipids and cholesterol at 39 years of age, then have this test every 5 years.  You may need to have your cholesterol levels checked more often if: ? Your lipid or cholesterol levels are high. ? You are older than 39 years of age. ? You are at high risk for heart disease.  Cancer screening Lung Cancer  Lung cancer screening is recommended for adults 55-80 years old who are at high risk for lung cancer because of a history of smoking.  A yearly low-dose CT scan of the lungs is recommended for people who: ? Currently smoke. ? Have quit within the past 15 years. ? Have at least a 30-pack-year history of smoking. A pack year is smoking an average of one pack of cigarettes a day for 1 year.  Yearly screening should continue until it has been 15 years since you quit.  Yearly   screening should stop if you develop a health problem that would prevent you from having lung cancer treatment.  Breast Cancer  Practice breast self-awareness. This means understanding how your breasts normally appear and feel.  It also means doing regular breast self-exams. Let your health care provider know about any changes, no matter how small.  If you are in your 20s or 30s, you should have a clinical breast exam (CBE) by a health care provider every 1-3 years as part of a regular health exam.  If you  are 73 or older, have a CBE every year. Also consider having a breast X-ray (mammogram) every year.  If you have a family history of breast cancer, talk to your health care provider about genetic screening.  If you are at high risk for breast cancer, talk to your health care provider about having an MRI and a mammogram every year.  Breast cancer gene (BRCA) assessment is recommended for women who have family members with BRCA-related cancers. BRCA-related cancers include: ? Breast. ? Ovarian. ? Tubal. ? Peritoneal cancers.  Results of the assessment will determine the need for genetic counseling and BRCA1 and BRCA2 testing.  Cervical Cancer Your health care provider may recommend that you be screened regularly for cancer of the pelvic organs (ovaries, uterus, and vagina). This screening involves a pelvic examination, including checking for microscopic changes to the surface of your cervix (Pap test). You may be encouraged to have this screening done every 3 years, beginning at age 61.  For women ages 90-65, health care providers may recommend pelvic exams and Pap testing every 3 years, or they may recommend the Pap and pelvic exam, combined with testing for human papilloma virus (HPV), every 5 years. Some types of HPV increase your risk of cervical cancer. Testing for HPV may also be done on women of any age with unclear Pap test results.  Other health care providers may not recommend any screening for nonpregnant women who are considered low risk for pelvic cancer and who do not have symptoms. Ask your health care provider if a screening pelvic exam is right for you.  If you have had past treatment for cervical cancer or a condition that could lead to cancer, you need Pap tests and screening for cancer for at least 20 years after your treatment. If Pap tests have been discontinued, your risk factors (such as having a new sexual partner) need to be reassessed to determine if screening should  resume. Some women have medical problems that increase the chance of getting cervical cancer. In these cases, your health care provider may recommend more frequent screening and Pap tests.  Colorectal Cancer  This type of cancer can be detected and often prevented.  Routine colorectal cancer screening usually begins at 39 years of age and continues through 39 years of age.  Your health care provider may recommend screening at an earlier age if you have risk factors for colon cancer.  Your health care provider may also recommend using home test kits to check for hidden blood in the stool.  A small camera at the end of a tube can be used to examine your colon directly (sigmoidoscopy or colonoscopy). This is done to check for the earliest forms of colorectal cancer.  Routine screening usually begins at age 67.  Direct examination of the colon should be repeated every 5-10 years through 39 years of age. However, you may need to be screened more often if early forms of precancerous polyps  or small growths are found.  Skin Cancer  Check your skin from head to toe regularly.  Tell your health care provider about any new moles or changes in moles, especially if there is a change in a mole's shape or color.  Also tell your health care provider if you have a mole that is larger than the size of a pencil eraser.  Always use sunscreen. Apply sunscreen liberally and repeatedly throughout the day.  Protect yourself by wearing long sleeves, pants, a wide-brimmed hat, and sunglasses whenever you are outside.  Heart disease, diabetes, and high blood pressure  High blood pressure causes heart disease and increases the risk of stroke. High blood pressure is more likely to develop in: ? People who have blood pressure in the high end of the normal range (130-139/85-89 mm Hg). ? People who are overweight or obese. ? People who are African American.  If you are 18-39 years of age, have your blood  pressure checked every 3-5 years. If you are 40 years of age or older, have your blood pressure checked every year. You should have your blood pressure measured twice-once when you are at a hospital or clinic, and once when you are not at a hospital or clinic. Record the average of the two measurements. To check your blood pressure when you are not at a hospital or clinic, you can use: ? An automated blood pressure machine at a pharmacy. ? A home blood pressure monitor.  If you are between 55 years and 79 years old, ask your health care provider if you should take aspirin to prevent strokes.  Have regular diabetes screenings. This involves taking a blood sample to check your fasting blood sugar level. ? If you are at a normal weight and have a low risk for diabetes, have this test once every three years after 39 years of age. ? If you are overweight and have a high risk for diabetes, consider being tested at a younger age or more often. Preventing infection Hepatitis B  If you have a higher risk for hepatitis B, you should be screened for this virus. You are considered at high risk for hepatitis B if: ? You were born in a country where hepatitis B is common. Ask your health care provider which countries are considered high risk. ? Your parents were born in a high-risk country, and you have not been immunized against hepatitis B (hepatitis B vaccine). ? You have HIV or AIDS. ? You use needles to inject street drugs. ? You live with someone who has hepatitis B. ? You have had sex with someone who has hepatitis B. ? You get hemodialysis treatment. ? You take certain medicines for conditions, including cancer, organ transplantation, and autoimmune conditions.  Hepatitis C  Blood testing is recommended for: ? Everyone born from 1945 through 1965. ? Anyone with known risk factors for hepatitis C.  Sexually transmitted infections (STIs)  You should be screened for sexually transmitted  infections (STIs) including gonorrhea and chlamydia if: ? You are sexually active and are younger than 39 years of age. ? You are older than 39 years of age and your health care provider tells you that you are at risk for this type of infection. ? Your sexual activity has changed since you were last screened and you are at an increased risk for chlamydia or gonorrhea. Ask your health care provider if you are at risk.  If you do not have HIV, but are at risk,   it may be recommended that you take a prescription medicine daily to prevent HIV infection. This is called pre-exposure prophylaxis (PrEP). You are considered at risk if: ? You are sexually active and do not regularly use condoms or know the HIV status of your partner(s). ? You take drugs by injection. ? You are sexually active with a partner who has HIV.  Talk with your health care provider about whether you are at high risk of being infected with HIV. If you choose to begin PrEP, you should first be tested for HIV. You should then be tested every 3 months for as long as you are taking PrEP. Pregnancy  If you are premenopausal and you may become pregnant, ask your health care provider about preconception counseling.  If you may become pregnant, take 400 to 800 micrograms (mcg) of folic acid every day.  If you want to prevent pregnancy, talk to your health care provider about birth control (contraception). Osteoporosis and menopause  Osteoporosis is a disease in which the bones lose minerals and strength with aging. This can result in serious bone fractures. Your risk for osteoporosis can be identified using a bone density scan.  If you are 34 years of age or older, or if you are at risk for osteoporosis and fractures, ask your health care provider if you should be screened.  Ask your health care provider whether you should take a calcium or vitamin D supplement to lower your risk for osteoporosis.  Menopause may have certain physical  symptoms and risks.  Hormone replacement therapy may reduce some of these symptoms and risks. Talk to your health care provider about whether hormone replacement therapy is right for you. Follow these instructions at home:  Schedule regular health, dental, and eye exams.  Stay current with your immunizations.  Do not use any tobacco products including cigarettes, chewing tobacco, or electronic cigarettes.  If you are pregnant, do not drink alcohol.  If you are breastfeeding, limit how much and how often you drink alcohol.  Limit alcohol intake to no more than 1 drink per day for nonpregnant women. One drink equals 12 ounces of beer, 5 ounces of wine, or 1 ounces of hard liquor.  Do not use street drugs.  Do not share needles.  Ask your health care provider for help if you need support or information about quitting drugs.  Tell your health care provider if you often feel depressed.  Tell your health care provider if you have ever been abused or do not feel safe at home. This information is not intended to replace advice given to you by your health care provider. Make sure you discuss any questions you have with your health care provider. Document Released: 12/09/2010 Document Revised: 11/01/2015 Document Reviewed: 02/27/2015 [employer]vier Interactive Patient Education  [employer].

## 2017-10-02 NOTE — Progress Notes (Signed)
Tina Colon 37 y.o.   Chief Complaint  Patient presents with  . Annual Exam    HISTORY OF PRESENT ILLNESS: This is a 37 y.o. female Here for annual exam; no complaints and no medical concerns.  No significant past medical history.  Only taking birth control pills.  Healthy lifestyle.  Smoking: No Drinking: Social Sleeping: Regular 8 hours Work: 8-hour shifts at [employer] Exercise: Adequate Stress: 5 and a 1-10 scale Nutrition: Proper   HPI   Prior to Admission medications   Medication Sig Start Date End Date Taking? Authorizing Provider  Calcium Carbonate (CALCIUM 600 PO) Take 600 mg by mouth daily.   Yes [provider]  Multiple Vitamin (MULTIVITAMIN WITH MINERALS) TABS tablet Take 1 tablet by mouth daily.   Yes [provider]  norgestimate-ethinyl estradiol (ORTHO-CYCLEN,SPRINTEC,PREVIFEM) 0.25-35 MG-MCG tablet Take 1 tablet by mouth daily.   Yes [provider]  ondansetron (ZOFRAN) 4 MG tablet Take 1 tablet (4 mg total) by mouth every 4 (four) hours as needed for nausea or vomiting. 05/29/15  Yes Pfeiffer, Jeannie Done, MD  hyoscyamine (LEVSIN SL) 0.125 MG SL tablet Place 1 tablet (0.125 mg total) under the tongue every 6 (six) hours as needed. Patient not taking: Reported on 10/02/2017 06/14/15   Hvozdovic, Lori P, PA-C  pantoprazole (PROTONIX) 20 MG tablet TAKE ONE TABLET BY MOUTH ONCE DAILY Patient not taking: Reported on 10/02/2017 09/29/16   Mauri Pole, MD  promethazine (PHENERGAN) 25 MG tablet Take 1 tablet (25 mg total) by mouth every 6 (six) hours as needed for nausea or vomiting. Patient not taking: Reported on 10/02/2017 05/28/15   Orpah Greek, MD    Allergies  Allergen Reactions  . Penicillins Rash    Has patient had a PCN reaction causing immediate rash, facial/tongue/throat swelling, SOB or lightheadedness with hypotension:Yes Has patient had a PCN reaction causing severe rash involving mucus membranes or skin  necrosis:unknown Has patient had a PCN reaction that required hospitalization:Yes Has patient had a PCN reaction occurring within the last 10 years:No If all of the above answers are "NO", then may proceed with Cephalosporin use.     Patient Active Problem List   Diagnosis Date Noted  . Gastritis 06/07/2015  . Alcohol abuse 06/07/2015  . Cannabis abuse 06/07/2015    Past Medical History:  Diagnosis Date  . ETOH abuse   . Gastritis   . GERD (gastroesophageal reflux disease)   . Pancreatitis     Past Surgical History:  Procedure Laterality Date  . WISDOM TOOTH EXTRACTION      Social History   Socioeconomic History  . Marital status: Married    Spouse name: Not on file  . Number of children: Not on file  . Years of education: Not on file  . Highest education level: Not on file  Occupational History  . Occupation: Lexicographer  . Financial resource strain: Not on file  . Food insecurity:    Worry: Not on file    Inability: Not on file  . Transportation needs:    Medical: Not on file    Non-medical: Not on file  Tobacco Use  . Smoking status: Former Research scientist (life sciences)  . Smokeless tobacco: Never Used  Substance and Sexual Activity  . Alcohol use: Yes    Comment: daily use- wine and beer  . Drug use: Yes    Types: Marijuana    Comment: occasionally  . Sexual activity: Not on file  Lifestyle  . Physical  activity:    Days per week: Not on file    Minutes per session: Not on file  . Stress: Not on file  Relationships  . Social connections:    Talks on phone: Not on file    Gets together: Not on file    Attends religious service: Not on file    Active member of club or organization: Not on file    Attends meetings of clubs or organizations: Not on file    Relationship status: Not on file  . Intimate partner violence:    Fear of current or ex partner: Not on file    Emotionally abused: Not on file    Physically abused: Not on file    Forced sexual activity:  Not on file  Other Topics Concern  . Not on file  Social History Narrative  . Not on file    Family History  Problem Relation Age of Onset  . Stomach cancer Maternal Grandfather   . Diabetes Unknown   . Heart disease Unknown   . Irritable bowel syndrome Unknown      Review of Systems  Constitutional: Negative.  Negative for chills, fever and malaise/fatigue.  HENT: Negative.  Negative for congestion, hearing loss, nosebleeds and sore throat.   Eyes: Negative.  Negative for blurred vision and double vision.  Respiratory: Negative.  Negative for cough and shortness of breath.   Cardiovascular: Negative.  Negative for chest pain, palpitations and claudication.  Gastrointestinal: Negative.  Negative for abdominal pain, diarrhea, nausea and vomiting.  Genitourinary: Negative.  Negative for dysuria and hematuria.  Musculoskeletal: Positive for joint pain (Intermittent left knee pain).  Skin: Negative.  Negative for rash.  Neurological: Negative.  Negative for dizziness and headaches.  Endo/Heme/Allergies: Negative.   All other systems reviewed and are negative.   Vitals:   10/02/17 0828  BP: 116/76  Pulse: 84  Resp: 16  Temp: 98.9 F (37.2 C)  SpO2: 99%    Physical Exam  Constitutional: She is oriented to person, place, and time. She appears well-developed and well-nourished.  HENT:  Head: Normocephalic and atraumatic.  Right Ear: External ear normal.  Left Ear: External ear normal.  Mouth/Throat: Oropharynx is clear and moist.  Eyes: Pupils are equal, round, and reactive to light. Conjunctivae and EOM are normal.  Neck: Normal range of motion. Neck supple. No JVD present. No thyromegaly present.  Cardiovascular: Normal rate, regular rhythm and normal heart sounds.  Pulmonary/Chest: Effort normal and breath sounds normal.  Abdominal: Soft. Bowel sounds are normal. She exhibits no distension and no mass. There is no tenderness. There is no guarding. Hernia confirmed  negative in the right inguinal area and confirmed negative in the left inguinal area.  Genitourinary: Vagina normal. There is no rash, tenderness or lesion on the right labia. There is no rash, tenderness or lesion on the left labia. Cervix exhibits no motion tenderness and no discharge. No erythema, tenderness or bleeding in the vagina. No vaginal discharge found.  Musculoskeletal: Normal range of motion. She exhibits no edema or tenderness.  Left knee: No erythema or bruising.  No swelling.  No tenderness.  Stable on flexion and extension.  Lymphadenopathy:    She has no cervical adenopathy. No inguinal adenopathy noted on the right or left side.  Neurological: She is alert and oriented to person, place, and time. No sensory deficit. She exhibits normal muscle tone. Coordination normal.  Skin: Skin is warm and dry. Capillary refill takes less than 2 seconds.  No rash noted.  Psychiatric: She has a normal mood and affect. Her behavior is normal.  Vitals reviewed.    ASSESSMENT & PLAN: Tina Colon was seen today for annual exam.  Diagnoses and all orders for this visit:  Routine general medical examination at a health care facility -     CBC with Differential -     Comprehensive metabolic panel -     Pap IG w/ reflex to HPV when ASC-U ([facility]) -     Mammogram Digital Screening; Future -     Hemoglobin A1c -     Lipid panel -     TSH -     HIV antibody  Screening mammogram, encounter for -     Cancel: MM Digital Diagnostic Bilat; Future   Patient Instructions       IF you received an x-ray today, you will receive an invoice from [facility]. Please contact [facility] at [phone] with questions or concerns regarding your invoice.   IF you received labwork today, you will receive an invoice from [facility]. Please contact [facility] at [phone] with questions or concerns regarding your invoice.   Our billing staff will not be able to assist you with  questions regarding bills from these companies.  You will be contacted with the lab results as soon as they are available. The fastest way to get your results is to activate your My Chart account. Instructions are located on the last page of this paperwork. If you have not heard from [place] regarding the results in 2 weeks, please contact this office.     Health Maintenance, Female Adopting a healthy lifestyle and getting preventive care can go a long way to promote health and wellness. Talk with your health care provider about what schedule of regular examinations is right for you. This is a good chance for you to check in with your provider about disease prevention and staying healthy. In between checkups, there are plenty of things you can do on your own. Experts have done a lot of research about which lifestyle changes and preventive measures are most likely to keep you healthy. Ask your health care provider for more information. Weight and diet Eat a healthy diet  Be sure to include plenty of vegetables, fruits, low-fat dairy products, and lean protein.  Do not eat a lot of foods high in solid fats, added sugars, or salt.  Get regular exercise. This is one of the most important things you can do for your health. ? Most adults should exercise for at least 150 minutes each week. The exercise should increase your heart rate and make you sweat (moderate-intensity exercise). ? Most adults should also do strengthening exercises at least twice a week. This is in addition to the moderate-intensity exercise.  Maintain a healthy weight  Body mass index (BMI) is a measurement that can be used to identify possible weight problems. It estimates body fat based on height and weight. Your health care provider can help determine your BMI and help you achieve or maintain a healthy weight.  For females 50 years of age and older: ? A BMI below 18.5 is considered underweight. ? A BMI of 18.5 to 24.9 is  normal. ? A BMI of 25 to 29.9 is considered overweight. ? A BMI of 30 and above is considered obese.  Watch levels of cholesterol and blood lipids  You should start having your blood tested for lipids and cholesterol at 39 years of age, then  have this test every 5 years.  You may need to have your cholesterol levels checked more often if: ? Your lipid or cholesterol levels are high. ? You are older than 39 years of age. ? You are at high risk for heart disease.  Cancer screening Lung Cancer  Lung cancer screening is recommended for adults 95-64 years old who are at high risk for lung cancer because of a history of smoking.  A yearly low-dose CT scan of the lungs is recommended for people who: ? Currently smoke. ? Have quit within the past 15 years. ? Have at least a 30-pack-year history of smoking. A pack year is smoking an average of one pack of cigarettes a day for 1 year.  Yearly screening should continue until it has been 15 years since you quit.  Yearly screening should stop if you develop a health problem that would prevent you from having lung cancer treatment.  Breast Cancer  Practice breast self-awareness. This means understanding how your breasts normally appear and feel.  It also means doing regular breast self-exams. Let your health care provider know about any changes, no matter how small.  If you are in your 20s or 30s, you should have a clinical breast exam (CBE) by a health care provider every 1-3 years as part of a regular health exam.  If you are 31 or older, have a CBE every year. Also consider having a breast X-ray (mammogram) every year.  If you have a family history of breast cancer, talk to your health care provider about genetic screening.  If you are at high risk for breast cancer, talk to your health care provider about having an MRI and a mammogram every year.  Breast cancer gene (BRCA) assessment is recommended for women who have family members  with BRCA-related cancers. BRCA-related cancers include: ? Breast. ? Ovarian. ? Tubal. ? Peritoneal cancers.  Results of the assessment will determine the need for genetic counseling and BRCA1 and BRCA2 testing.  Cervical Cancer Your health care provider may recommend that you be screened regularly for cancer of the pelvic organs (ovaries, uterus, and vagina). This screening involves a pelvic examination, including checking for microscopic changes to the surface of your cervix (Pap test). You may be encouraged to have this screening done every 3 years, beginning at age 54.  For women ages 38-65, health care providers may recommend pelvic exams and Pap testing every 3 years, or they may recommend the Pap and pelvic exam, combined with testing for human papilloma virus (HPV), every 5 years. Some types of HPV increase your risk of cervical cancer. Testing for HPV may also be done on women of any age with unclear Pap test results.  Other health care providers may not recommend any screening for nonpregnant women who are considered low risk for pelvic cancer and who do not have symptoms. Ask your health care provider if a screening pelvic exam is right for you.  If you have had past treatment for cervical cancer or a condition that could lead to cancer, you need Pap tests and screening for cancer for at least 20 years after your treatment. If Pap tests have been discontinued, your risk factors (such as having a new sexual partner) need to be reassessed to determine if screening should resume. Some women have medical problems that increase the chance of getting cervical cancer. In these cases, your health care provider may recommend more frequent screening and Pap tests.  Colorectal Cancer  This  type of cancer can be detected and often prevented.  Routine colorectal cancer screening usually begins at 39 years of age and continues through 39 years of age.  Your health care provider may recommend  screening at an earlier age if you have risk factors for colon cancer.  Your health care provider may also recommend using home test kits to check for hidden blood in the stool.  A small camera at the end of a tube can be used to examine your colon directly (sigmoidoscopy or colonoscopy). This is done to check for the earliest forms of colorectal cancer.  Routine screening usually begins at age 25.  Direct examination of the colon should be repeated every 5-10 years through 39 years of age. However, you may need to be screened more often if early forms of precancerous polyps or small growths are found.  Skin Cancer  Check your skin from head to toe regularly.  Tell your health care provider about any new moles or changes in moles, especially if there is a change in a mole's shape or color.  Also tell your health care provider if you have a mole that is larger than the size of a pencil eraser.  Always use sunscreen. Apply sunscreen liberally and repeatedly throughout the day.  Protect yourself by wearing long sleeves, pants, a wide-brimmed hat, and sunglasses whenever you are outside.  Heart disease, diabetes, and high blood pressure  High blood pressure causes heart disease and increases the risk of stroke. High blood pressure is more likely to develop in: ? People who have blood pressure in the high end of the normal range (130-139/85-89 mm Hg). ? People who are overweight or obese. ? People who are African American.  If you are 24-53 years of age, have your blood pressure checked every 3-5 years. If you are 62 years of age or older, have your blood pressure checked every year. You should have your blood pressure measured twice-once when you are at a hospital or clinic, and once when you are not at a hospital or clinic. Record the average of the two measurements. To check your blood pressure when you are not at a hospital or clinic, you can use: ? An automated blood pressure machine at  a pharmacy. ? A home blood pressure monitor.  If you are between 34 years and 59 years old, ask your health care provider if you should take aspirin to prevent strokes.  Have regular diabetes screenings. This involves taking a blood sample to check your fasting blood sugar level. ? If you are at a normal weight and have a low risk for diabetes, have this test once every three years after 39 years of age. ? If you are overweight and have a high risk for diabetes, consider being tested at a younger age or more often. Preventing infection Hepatitis B  If you have a higher risk for hepatitis B, you should be screened for this virus. You are considered at high risk for hepatitis B if: ? You were born in a country where hepatitis B is common. Ask your health care provider which countries are considered high risk. ? Your parents were born in a high-risk country, and you have not been immunized against hepatitis B (hepatitis B vaccine). ? You have HIV or AIDS. ? You use needles to inject street drugs. ? You live with someone who has hepatitis B. ? You have had sex with someone who has hepatitis B. ? You get hemodialysis treatment. ?  You take certain medicines for conditions, including cancer, organ transplantation, and autoimmune conditions.  Hepatitis C  Blood testing is recommended for: ? Everyone born from [birth year] through 1967. ? Anyone with known risk factors for hepatitis C.  Sexually transmitted infections (STIs)  You should be screened for sexually transmitted infections (STIs) including gonorrhea and chlamydia if: ? You are sexually active and are younger than 39 years of age. ? You are older than 39 years of age and your health care provider tells you that you are at risk for this type of infection. ? Your sexual activity has changed since you were last screened and you are at an increased risk for chlamydia or gonorrhea. Ask your health care provider if you are at risk.  If you do  not have HIV, but are at risk, it may be recommended that you take a prescription medicine daily to prevent HIV infection. This is called pre-exposure prophylaxis (PrEP). You are considered at risk if: ? You are sexually active and do not regularly use condoms or know the HIV status of your partner(s). ? You take drugs by injection. ? You are sexually active with a partner who has HIV.  Talk with your health care provider about whether you are at high risk of being infected with HIV. If you choose to begin PrEP, you should first be tested for HIV. You should then be tested every 3 months for as long as you are taking PrEP. Pregnancy  If you are premenopausal and you may become pregnant, ask your health care provider about preconception counseling.  If you may become pregnant, take 400 to 800 micrograms (mcg) of folic acid every day.  If you want to prevent pregnancy, talk to your health care provider about birth control (contraception). Osteoporosis and menopause  Osteoporosis is a disease in which the bones lose minerals and strength with aging. This can result in serious bone fractures. Your risk for osteoporosis can be identified using a bone density scan.  If you are 51 years of age or older, or if you are at risk for osteoporosis and fractures, ask your health care provider if you should be screened.  Ask your health care provider whether you should take a calcium or vitamin D supplement to lower your risk for osteoporosis.  Menopause may have certain physical symptoms and risks.  Hormone replacement therapy may reduce some of these symptoms and risks. Talk to your health care provider about whether hormone replacement therapy is right for you. Follow these instructions at home:  Schedule regular health, dental, and eye exams.  Stay current with your immunizations.  Do not use any tobacco products including cigarettes, chewing tobacco, or electronic cigarettes.  If you are  pregnant, do not drink alcohol.  If you are breastfeeding, limit how much and how often you drink alcohol.  Limit alcohol intake to no more than 1 drink per day for nonpregnant women. One drink equals 12 ounces of beer, 5 ounces of wine, or 1 ounces of hard liquor.  Do not use street drugs.  Do not share needles.  Ask your health care provider for help if you need support or information about quitting drugs.  Tell your health care provider if you often feel depressed.  Tell your health care provider if you have ever been abused or do not feel safe at home. This information is not intended to replace advice given to you by your health care provider. Make sure you discuss any questions you  have with your health care provider. Document Released: 12/09/2010 Document Revised: 11/01/2015 Document Reviewed: 02/27/2015 Elsevier Interactive Patient Education  2018 Elsevier Inc.      Agustina Caroli, MD [facility]

## 2017-10-03 ENCOUNTER — Encounter: Payer: Self-pay | Admitting: Radiology

## 2017-10-03 LAB — COMPREHENSIVE METABOLIC PANEL
A/G RATIO: 2 (ref 1.2–2.2)
ALT: 16 IU/L (ref 0–32)
AST: 18 IU/L (ref 0–40)
Albumin: 4.4 g/dL (ref 3.5–5.5)
Alkaline Phosphatase: 42 IU/L (ref 39–117)
BILIRUBIN TOTAL: 0.3 mg/dL (ref 0.0–1.2)
BUN / CREAT RATIO: 19 (ref 9–23)
BUN: 13 mg/dL (ref 6–20)
CO2: 20 mmol/L (ref 20–29)
CREATININE: 0.7 mg/dL (ref 0.57–1.00)
Calcium: 9.6 mg/dL (ref 8.7–10.2)
Chloride: 106 mmol/L (ref 96–106)
GFR calc Af Amer: 126 mL/min/{1.73_m2} (ref >59)
GFR, EST NON AFRICAN AMERICAN: 109 mL/min/{1.73_m2} (ref >59)
Globulin, Total: 2.2 g/dL (ref 1.5–4.5)
Glucose: 91 mg/dL (ref 65–99)
Potassium: 4 mmol/L (ref 3.5–5.2)
Sodium: 139 mmol/L (ref 134–144)
Total Protein: 6.6 g/dL (ref 6.0–8.5)

## 2017-10-03 LAB — CBC WITH DIFFERENTIAL/PLATELET
BASOS ABS: 0 10*3/uL (ref 0.0–0.2)
Basos: 1 %
EOS (ABSOLUTE): 0.1 10*3/uL (ref 0.0–0.4)
EOS: 1 %
HEMATOCRIT: 39.7 % (ref 34.0–46.6)
Hemoglobin: 13.2 g/dL (ref 11.1–15.9)
Immature Grans (Abs): 0 10*3/uL (ref 0.0–0.1)
Immature Granulocytes: 0 %
LYMPHS ABS: 1.8 10*3/uL (ref 0.7–3.1)
Lymphs: 30 %
MCH: 29.5 pg (ref 26.6–33.0)
MCHC: 33.2 g/dL (ref 31.5–35.7)
MCV: 89 fL (ref 79–97)
MONOS ABS: 0.5 10*3/uL (ref 0.1–0.9)
Monocytes: 7 %
NEUTROS PCT: 61 %
Neutrophils Absolute: 3.7 10*3/uL (ref 1.4–7.0)
PLATELETS: 242 10*3/uL (ref 150–379)
RBC: 4.47 x10E6/uL (ref 3.77–5.28)
RDW: 13.3 % (ref 12.3–15.4)
WBC: 6.1 10*3/uL (ref 3.4–10.8)

## 2017-10-03 LAB — LIPID PANEL
CHOL/HDL RATIO: 2.6 ratio (ref 0.0–4.4)
Cholesterol, Total: 144 mg/dL (ref 100–199)
HDL: 56 mg/dL (ref >39)
LDL Calculated: 64 mg/dL (ref 0–99)
TRIGLYCERIDES: 118 mg/dL (ref 0–149)
VLDL CHOLESTEROL CAL: 24 mg/dL (ref 5–40)

## 2017-10-03 LAB — HIV ANTIBODY (ROUTINE TESTING W REFLEX): HIV Screen 4th Generation wRfx: NONREACTIVE

## 2017-10-03 LAB — HEMOGLOBIN A1C
Est. average glucose Bld gHb Est-mCnc: 108 mg/dL
HEMOGLOBIN A1C: 5.4 % (ref 4.8–5.6)

## 2017-10-03 LAB — TSH: TSH: 2.45 u[IU]/mL (ref 0.450–4.500)

## 2017-10-06 LAB — PAP IG W/ RFLX HPV ASCU: PAP SMEAR COMMENT: 0

## 2017-10-07 ENCOUNTER — Encounter: Payer: Self-pay | Admitting: *Deleted

## 2017-11-30 ENCOUNTER — Ambulatory Visit: Payer: Self-pay

## 2017-11-30 ENCOUNTER — Emergency Department (HOSPITAL_COMMUNITY)
Admission: EM | Admit: 2017-11-30 | Discharge: 2017-11-30 | Disposition: A | Discharge status: Home / Self Care | Payer: BLUE CROSS/BLUE SHIELD | Attending: Emergency Medicine | Admitting: Emergency Medicine

## 2017-11-30 ENCOUNTER — Encounter (HOSPITAL_COMMUNITY): Payer: Self-pay | Admitting: Emergency Medicine

## 2017-11-30 ENCOUNTER — Emergency Department (HOSPITAL_COMMUNITY): Payer: BLUE CROSS/BLUE SHIELD

## 2017-11-30 DIAGNOSIS — Z79899 Other long term (current) drug therapy: Secondary | ICD-10-CM | POA: Insufficient documentation

## 2017-11-30 DIAGNOSIS — Z87891 Personal history of nicotine dependence: Secondary | ICD-10-CM | POA: Insufficient documentation

## 2017-11-30 DIAGNOSIS — R0602 Shortness of breath: Secondary | ICD-10-CM | POA: Diagnosis not present

## 2017-11-30 DIAGNOSIS — R079 Chest pain, unspecified: Secondary | ICD-10-CM

## 2017-11-30 LAB — BASIC METABOLIC PANEL
Anion gap: 9 (ref 5–15)
BUN: 14 mg/dL (ref 6–20)
CHLORIDE: 108 mmol/L (ref 101–111)
CO2: 22 mmol/L (ref 22–32)
CREATININE: 0.64 mg/dL (ref 0.44–1.00)
Calcium: 9.2 mg/dL (ref 8.9–10.3)
GFR calc Af Amer: >60 mL/min (ref >60)
GFR calc non Af Amer: >60 mL/min (ref >60)
Glucose, Bld: 88 mg/dL (ref 65–99)
POTASSIUM: 3.5 mmol/L (ref 3.5–5.1)
Sodium: 139 mmol/L (ref 135–145)

## 2017-11-30 LAB — CBC
HEMATOCRIT: 37.9 % (ref 36.0–46.0)
HEMOGLOBIN: 12.8 g/dL (ref 12.0–15.0)
MCH: 30.1 pg (ref 26.0–34.0)
MCHC: 33.8 g/dL (ref 30.0–36.0)
MCV: 89.2 fL (ref 78.0–100.0)
Platelets: 260 10*3/uL (ref 150–400)
RBC: 4.25 MIL/uL (ref 3.87–5.11)
RDW: 13.3 % (ref 11.5–15.5)
WBC: 7.2 10*3/uL (ref 4.0–10.5)

## 2017-11-30 LAB — I-STAT TROPONIN, ED
TROPONIN I, POC: 0.03 ng/mL (ref 0.00–0.08)
Troponin i, poc: 0.03 ng/mL (ref 0.00–0.08)

## 2017-11-30 LAB — I-STAT BETA HCG BLOOD, ED (MC, WL, AP ONLY): I-stat hCG, quantitative: <5 m[IU]/mL (ref <5)

## 2017-11-30 LAB — BRAIN NATRIURETIC PEPTIDE: B NATRIURETIC PEPTIDE 5: 281 pg/mL — AB (ref 0.0–100.0)

## 2017-11-30 MED ORDER — ONDANSETRON 8 MG PO TBDP: 8.0000 mg | ORAL_TABLET | Freq: Three times a day (TID) | ORAL | 0 refills | Status: DC | PRN

## 2017-11-30 MED ORDER — IOPAMIDOL (ISOVUE-370) INJECTION 76%
INTRAVENOUS | Status: AC
  Filled 2017-11-30: qty 100

## 2017-11-30 MED ORDER — IOPAMIDOL (ISOVUE-370) INJECTION 76%
100.0000 mL | Freq: Once | INTRAVENOUS | Status: AC | PRN
  Administered 2017-11-30: 100 mL via INTRAVENOUS

## 2017-11-30 MED ORDER — FAMOTIDINE 20 MG PO TABS: 20.0000 mg | ORAL_TABLET | Freq: Two times a day (BID) | ORAL | 0 refills | Status: DC

## 2017-11-30 NOTE — Telephone Encounter (Signed)
Phone call rec'd from pt.  Reported she has had "intermittent sharp pain in left breast" x 1 week.  Denied any radiation of the pain.  Reported she feels short of breath both during chest pain, and with increased activity.  Reported pain level at 5-6/10.  Stated it has increased in frequency and intensity, since it started one week ago.  Reported it can last 30 min. to 1 hour.  Reported last episode was earlier this morning, while at work, and lasted 30 min.  Stated she woke up with shortness of breath today, and this has not improved.  Denied sweating, nausea or vomiting.  Stated has shortness of breath with deep breath, but that the intensity of pain is not as much, compared to the intermittent chest pain.  Advised to go to ER due to complaints and family hx., and risk factors.  Pt. driving at time of Triage call, and plans to go to [place]. Advised will make Dr. Alvy Bimler aware.  Verb. Understanding; agrees with plan.          Reason for Disposition . [1] Intermittent  chest pain or "angina" AND [2] increasing in severity or frequency  (Exception: pains lasting a few seconds)    C/o intermittent sharp (L) anterior chest pain and SOB, that has increased in freq. over past week.  Answer Assessment - Initial Assessment Questions 1. LOCATION: "Where does it hurt?"       Sharp pain in left breast area  2. RADIATION: "Does the pain go anywhere else?" (e.g., into neck, jaw, arms, back)    Denied radiation  3. ONSET: "When did the chest pain begin?" (Minutes, hours or days)      Sunday, 6/16 4. PATTERN "Does the pain come and go, or has it been constant since it started?"  "Does it get worse with exertion?"      Comes and goes  5. DURATION: "How long does it last" (e.g., seconds, minutes, hours)    About one hour 6. SEVERITY: "How bad is the pain?"  (e.g., Scale 1-10; mild, moderate, or severe)    - MILD (1-3): doesn't interfere with normal activities     - MODERATE (4-7): interferes with normal  activities or awakens from sleep    - SEVERE (8-10): excruciating pain, unable to do any normal activities       5-6/10 7. CARDIAC RISK FACTORS: "Do you have any history of heart problems or risk factors for heart disease?" (e.g., prior heart attack, angina; high blood pressure, diabetes, being overweight, high cholesterol, smoking, or strong family history of heart disease)     Mother died of heart attack ; denied HBP, diabetes, high cholesterol.  Stated slightly overweight 8. PULMONARY RISK FACTORS: "Do you have any history of lung disease?"  (e.g., blood clots in lung, asthma, emphysema, birth control pills)     Currently on birth control ; denied asthma or emphysema 9. CAUSE: "What do you think is causing the chest pain?"     unknown 10. OTHER SYMPTOMS: "Do you have any other symptoms?" (e.g., dizziness, nausea, vomiting, sweating, fever, difficulty breathing, cough)       Shortness of breath with chest pain; SOB with increased activity; denied nausea/ vomiting or sweating.  Occasional pain in left side of chest with deep breath, but not same intensity as the intermittent chest pain .   11. PREGNANCY: "Is there any chance you are pregnant?" "When was your last menstrual period?"      On  birth control ; LMP 5/26  Protocols used: CHEST PAIN-A-AH

## 2017-11-30 NOTE — Discharge Instructions (Addendum)
Follow-up with a primary care doctor if the symptoms persist, blood tests and x-rays are reassuring.  Take over-the-counter medications as needed for pain.  Follow-up with your primary care doctor

## 2017-11-30 NOTE — ED Triage Notes (Signed)
Pt c/o intermittent chest pains and SOB since 2 Sundays ago. Pt reports pains are worse when she is at work moving around. Pt has little cough.

## 2017-11-30 NOTE — ED Notes (Signed)
Pt ambulated to bathroom with no assist. 

## 2017-11-30 NOTE — ED Provider Notes (Signed)
[facility]   CSN: [id number] Arrival date & time: 11/30/17  1344     History   Chief Complaint Chief Complaint  Patient presents with  . Chest Pain  . Shortness of Breath    HPI Tina Colon is a 37 y.o. female.  HPI Pt started having pain in her chest last Sunday.  The sx come and go.  It usually increases with activity.  The pain is sharp and burning.   She also feels short of breath.  No fevers.  No cough.  No leg swelling.  No hx of heart or lung disease.  No smoking. Pt does drink a couple of beers daily. Past Medical History:  Diagnosis Date  . ETOH abuse   . Gastritis   . GERD (gastroesophageal reflux disease)   . Pancreatitis     Patient Active Problem List   Diagnosis Date Noted  . Routine general medical examination at a health care facility 10/02/2017  . Gastritis 06/07/2015  . Alcohol abuse 06/07/2015  . Cannabis abuse 06/07/2015    Past Surgical History:  Procedure Laterality Date  . WISDOM TOOTH EXTRACTION       OB History   None      Home Medications    Prior to Admission medications   Medication Sig Start Date End Date Taking? Authorizing Provider  norgestimate-ethinyl estradiol (ORTHO-CYCLEN,SPRINTEC,PREVIFEM) 0.25-35 MG-MCG tablet Take 1 tablet by mouth daily.   Yes [provider]  hyoscyamine (LEVSIN SL) 0.125 MG SL tablet Place 1 tablet (0.125 mg total) under the tongue every 6 (six) hours as needed. Patient not taking: Reported on 10/02/2017 06/14/15   Hvozdovic, Lori P, PA-C  ondansetron (ZOFRAN) 4 MG tablet Take 1 tablet (4 mg total) by mouth every 4 (four) hours as needed for nausea or vomiting. Patient not taking: Reported on 11/30/2017 05/29/15   Arby Barrette, MD  pantoprazole (PROTONIX) 20 MG tablet TAKE ONE TABLET BY MOUTH ONCE DAILY Patient not taking: Reported on 10/02/2017 09/29/16   Napoleon Form, MD  promethazine (PHENERGAN) 25 MG tablet Take 1 tablet (25  mg total) by mouth every 6 (six) hours as needed for nausea or vomiting. Patient not taking: Reported on 10/02/2017 05/28/15   Gilda Crease, MD    Family History Family History  Problem Relation Age of Onset  . Stomach cancer Maternal Grandfather   . Diabetes Unknown   . Heart disease Unknown   . Irritable bowel syndrome Unknown     Social History Social History   Tobacco Use  . Smoking status: Former Games developer  . Smokeless tobacco: Never Used  Substance Use Topics  . Alcohol use: Yes    Comment: daily use- wine and beer  . Drug use: Yes    Types: Marijuana    Comment: occasionally     Allergies   Penicillins   Review of Systems Review of Systems  All other systems reviewed and are negative.    Physical Exam Updated Vital Signs BP (!) 148/85 (BP Location: Left Arm)   Pulse 100   Temp 98.6 F (37 C) (Oral)   Resp (!) 23   Ht 1.524 m (5')   Wt 83.5 kg (184 lb)   LMP 11/01/2017   SpO2 97%   BMI 35.94 kg/m   Physical Exam  Constitutional: She appears well-developed and well-nourished. No distress.  HENT:  Head: Normocephalic and atraumatic.  Right Ear: External ear normal.  Left Ear: External ear normal.  Eyes: Conjunctivae are normal. Right eye exhibits no discharge. Left eye exhibits no discharge. No scleral icterus.  Neck: Neck supple. No tracheal deviation present.  Cardiovascular: Normal rate, regular rhythm and intact distal pulses.  Pulmonary/Chest: Effort normal and breath sounds normal. No stridor. No respiratory distress. She has no wheezes. She has no rales.  Abdominal: Soft. Bowel sounds are normal. She exhibits no distension. There is no tenderness. There is no rebound and no guarding.  Musculoskeletal: She exhibits no edema or tenderness.  Neurological: She is alert. She has normal strength. No cranial nerve deficit (no facial droop, extraocular movements intact, no slurred speech) or sensory deficit. She exhibits normal muscle tone. She  displays no seizure activity. Coordination normal.  Skin: Skin is warm and dry. No rash noted.  Psychiatric: She has a normal mood and affect.  Nursing note and vitals reviewed.    ED Treatments / Results  Labs (all labs ordered are listed, but only abnormal results are displayed) Labs Reviewed  BRAIN NATRIURETIC PEPTIDE - Abnormal; Notable for the following components:      Result Value   B Natriuretic Peptide 281.0 (*)    All other components within normal limits  BASIC METABOLIC PANEL  CBC  I-STAT TROPONIN, ED  I-STAT BETA HCG BLOOD, ED ([facility], [facility], [facility] ONLY)  I-STAT TROPONIN, ED    EKG EKG Interpretation  Date/Time:  Monday November 30 2017 13:53:29 EDT Ventricular Rate:  104 PR Interval:    QRS Duration: 79 QT Interval:  354 QTC Calculation: 466 R Axis:   34 Text Interpretation:  Sinus tachycardia Borderline T abnormalities, anterior leads No old tracing to compare Confirmed by Linwood Dibbles [phone]) on 11/30/2017 6:05:44 PM   Radiology Dg Chest 2 View  Result Date: 11/30/2017 CLINICAL DATA:  Intermittent chest pain and shortness of breath for the past 2 weeks. EXAM: CHEST - 2 VIEW COMPARISON:  None. FINDINGS: Enlarged, globular appearance of the cardiopericardial silhouette. Normal pulmonary vascularity. No focal consolidation, pleural effusion, or pneumothorax. No acute osseous abnormality. IMPRESSION: 1. Enlarged, globular appearance of the cardiopericardial silhouette could reflect pericardial effusion versus cardiomegaly. Electronically Signed   By: Obie Dredge M.D.   On: 11/30/2017 15:43   Ct Angio Chest Pe W And/or Wo Contrast  Result Date: 11/30/2017 CLINICAL DATA:  Intermittent chest pain and shortness of breath for approximately 2 weeks. EXAM: CT ANGIOGRAPHY CHEST WITH CONTRAST TECHNIQUE: Multidetector CT imaging of the chest was performed using the standard protocol during bolus administration of intravenous contrast. Multiplanar CT image reconstructions and MIPs were  obtained to evaluate the vascular anatomy. CONTRAST:  100 cc ISOVUE-370 IOPAMIDOL (ISOVUE-370) INJECTION 76% COMPARISON:  Chest x-ray from earlier same day. FINDINGS: Cardiovascular: Mild cardiomegaly. No pericardial effusion. Thoracic aorta is normal in caliber and configuration. No aortic dissection. Some of the most segmental and subsegmental pulmonary artery branches are difficult to definitively characterize due to patient breathing motion artifact, however, there is no pulmonary embolism identified within the main, lobar or central segmental pulmonary arteries bilaterally. Mediastinum/Nodes: No mass or enlarged lymph nodes within the mediastinum or perihilar regions. Esophagus appears normal. Trachea and central bronchi are unremarkable. Lungs/Pleura: Lungs are clear.  No pleural effusion or pneumothorax. Upper Abdomen: Limited images of the upper abdomen are unremarkable. Musculoskeletal: No acute or significant osseous finding. Mild degenerative spurring within the lower thoracic spine. Review of the MIP images confirms the above findings. IMPRESSION: 1. Mild cardiomegaly.  No pericardial effusion. 2. No acute findings. No pulmonary embolism seen, with  mild study limitations detailed above. No pneumonia or pulmonary edema. No thoracic aortic aneurysm or dissection. Electronically Signed   By: Bary Richard M.D.   On: 11/30/2017 20:27    Procedures Procedures (including critical care time)  Medications Ordered in ED Medications  iopamidol (ISOVUE-370) 76 % injection (has no administration in time range)  iopamidol (ISOVUE-370) 76 % injection (has no administration in time range)  iopamidol (ISOVUE-370) 76 % injection 100 mL (100 mLs Intravenous Contrast Given 11/30/17 1957)     Initial Impression / Assessment and Plan / ED Course  I have reviewed the triage vital signs and the nursing notes.  Pertinent labs & imaging results that were available during my care of the patient were reviewed by me  and considered in my medical decision making (see chart for details).   Patient presents to the emergency room for chest pain.  Patient's initial labs are reassuring.  Chest x-ray was concerning for the possibility of a pericardial effusion or cardiomegaly.  CT scan was performed to evaluate for the possibility of pulmonary embolism with her complaints of pleuritic chest pain.  CT scan does not show any events of PE.  It also does not show any evidence of significant pericardial effusion.  Patient does drink alcohol regularly and this might be causing mild cardiomegaly.  Discussed outpatient follow-up with her primary care doctor.  Final Clinical Impressions(s) / ED Diagnoses   Final diagnoses:  Chest pain, unspecified type      Linwood Dibbles, MD 11/30/17 2059

## 2017-11-30 NOTE — ED Notes (Signed)
Blood was in lab for BNP.

## 2017-12-23 ENCOUNTER — Inpatient Hospital Stay (HOSPITAL_COMMUNITY)
Admission: EM | Admit: 2017-12-23 | Discharge: 2017-12-25 | DRG: 292 | Disposition: A | Discharge status: Home / Self Care | Payer: BLUE CROSS/BLUE SHIELD | Attending: Internal Medicine | Admitting: Internal Medicine

## 2017-12-23 ENCOUNTER — Emergency Department (HOSPITAL_COMMUNITY): Payer: BLUE CROSS/BLUE SHIELD

## 2017-12-23 ENCOUNTER — Other Ambulatory Visit: Payer: Self-pay

## 2017-12-23 ENCOUNTER — Encounter (HOSPITAL_COMMUNITY): Payer: Self-pay | Admitting: Emergency Medicine

## 2017-12-23 ENCOUNTER — Ambulatory Visit: Payer: Self-pay | Admitting: Emergency Medicine

## 2017-12-23 DIAGNOSIS — I5021 Acute systolic (congestive) heart failure: Secondary | ICD-10-CM | POA: Diagnosis not present

## 2017-12-23 DIAGNOSIS — R0602 Shortness of breath: Secondary | ICD-10-CM

## 2017-12-23 DIAGNOSIS — I420 Dilated cardiomyopathy: Secondary | ICD-10-CM | POA: Diagnosis present

## 2017-12-23 DIAGNOSIS — I509 Heart failure, unspecified: Secondary | ICD-10-CM

## 2017-12-23 DIAGNOSIS — K219 Gastro-esophageal reflux disease without esophagitis: Secondary | ICD-10-CM | POA: Diagnosis present

## 2017-12-23 DIAGNOSIS — Z87891 Personal history of nicotine dependence: Secondary | ICD-10-CM

## 2017-12-23 DIAGNOSIS — R000 Tachycardia, unspecified: Secondary | ICD-10-CM

## 2017-12-23 DIAGNOSIS — Z8249 Family history of ischemic heart disease and other diseases of the circulatory system: Secondary | ICD-10-CM

## 2017-12-23 DIAGNOSIS — J90 Pleural effusion, not elsewhere classified: Secondary | ICD-10-CM | POA: Diagnosis not present

## 2017-12-23 DIAGNOSIS — E876 Hypokalemia: Secondary | ICD-10-CM

## 2017-12-23 DIAGNOSIS — K2970 Gastritis, unspecified, without bleeding: Secondary | ICD-10-CM | POA: Diagnosis present

## 2017-12-23 DIAGNOSIS — I340 Nonrheumatic mitral (valve) insufficiency: Secondary | ICD-10-CM | POA: Diagnosis not present

## 2017-12-23 DIAGNOSIS — F1010 Alcohol abuse, uncomplicated: Secondary | ICD-10-CM | POA: Diagnosis present

## 2017-12-23 LAB — CBC WITH DIFFERENTIAL/PLATELET
Basophils Absolute: 0.1 10*3/uL (ref 0.0–0.1)
Basophils Relative: 1 %
EOS ABS: 0.1 10*3/uL (ref 0.0–0.7)
Eosinophils Relative: 1 %
HEMATOCRIT: 39.1 % (ref 36.0–46.0)
HEMOGLOBIN: 13.1 g/dL (ref 12.0–15.0)
LYMPHS ABS: 1.7 10*3/uL (ref 0.7–4.0)
Lymphocytes Relative: 24 %
MCH: 29.9 pg (ref 26.0–34.0)
MCHC: 33.5 g/dL (ref 30.0–36.0)
MCV: 89.3 fL (ref 78.0–100.0)
Monocytes Absolute: 0.4 10*3/uL (ref 0.1–1.0)
Monocytes Relative: 5 %
NEUTROS ABS: 5.1 10*3/uL (ref 1.7–7.7)
NEUTROS PCT: 69 %
Platelets: 239 10*3/uL (ref 150–400)
RBC: 4.38 MIL/uL (ref 3.87–5.11)
RDW: 13.4 % (ref 11.5–15.5)
WBC: 7.3 10*3/uL (ref 4.0–10.5)

## 2017-12-23 LAB — COMPREHENSIVE METABOLIC PANEL
ALT: 32 U/L (ref 0–44)
AST: 32 U/L (ref 15–41)
Albumin: 4 g/dL (ref 3.5–5.0)
Alkaline Phosphatase: 38 U/L (ref 38–126)
Anion gap: 11 (ref 5–15)
BUN: 13 mg/dL (ref 6–20)
CHLORIDE: 111 mmol/L (ref 98–111)
CO2: 23 mmol/L (ref 22–32)
Calcium: 9.3 mg/dL (ref 8.9–10.3)
Creatinine, Ser: 0.69 mg/dL (ref 0.44–1.00)
GFR calc Af Amer: >60 mL/min (ref >60)
GFR calc non Af Amer: >60 mL/min (ref >60)
GLUCOSE: 103 mg/dL — AB (ref 70–99)
POTASSIUM: 3.3 mmol/L — AB (ref 3.5–5.1)
SODIUM: 145 mmol/L (ref 135–145)
Total Bilirubin: 0.2 mg/dL — ABNORMAL LOW (ref 0.3–1.2)
Total Protein: 7.1 g/dL (ref 6.5–8.1)

## 2017-12-23 LAB — TROPONIN I

## 2017-12-23 LAB — LIPASE, BLOOD: LIPASE: 25 U/L (ref 11–51)

## 2017-12-23 LAB — ETHANOL: Alcohol, Ethyl (B): <10 mg/dL (ref <10)

## 2017-12-23 LAB — D-DIMER, QUANTITATIVE: D-Dimer, Quant: 1.46 ug/mL-FEU — ABNORMAL HIGH (ref 0.00–0.50)

## 2017-12-23 LAB — BRAIN NATRIURETIC PEPTIDE: B Natriuretic Peptide: 411.2 pg/mL — ABNORMAL HIGH (ref 0.0–100.0)

## 2017-12-23 LAB — TSH: TSH: 3.184 u[IU]/mL (ref 0.350–4.500)

## 2017-12-23 MED ORDER — FUROSEMIDE 10 MG/ML IJ SOLN
40.0000 mg | Freq: Every day | INTRAMUSCULAR | Status: DC
  Administered 2017-12-24: 40 mg via INTRAVENOUS
  Filled 2017-12-23: qty 4

## 2017-12-23 MED ORDER — FUROSEMIDE 20 MG PO TABS: 20.0000 mg | ORAL_TABLET | Freq: Every day | ORAL | 0 refills | Status: DC

## 2017-12-23 MED ORDER — SODIUM CHLORIDE 0.9% FLUSH
3.0000 mL | Freq: Two times a day (BID) | INTRAVENOUS | Status: DC
  Administered 2017-12-23 – 2017-12-25 (×4): 3 mL via INTRAVENOUS

## 2017-12-23 MED ORDER — POTASSIUM CHLORIDE CRYS ER 20 MEQ PO TBCR
40.0000 meq | EXTENDED_RELEASE_TABLET | Freq: Once | ORAL | Status: AC
  Administered 2017-12-23: 40 meq via ORAL
  Filled 2017-12-23: qty 2

## 2017-12-23 MED ORDER — ADULT MULTIVITAMIN W/MINERALS CH
1.0000 | ORAL_TABLET | Freq: Every day | ORAL | Status: DC
  Administered 2017-12-24 – 2017-12-25 (×2): 1 via ORAL
  Filled 2017-12-23 (×2): qty 1

## 2017-12-23 MED ORDER — FUROSEMIDE 10 MG/ML IJ SOLN
20.0000 mg | Freq: Once | INTRAMUSCULAR | Status: AC
  Administered 2017-12-23: 20 mg via INTRAVENOUS
  Filled 2017-12-23: qty 4

## 2017-12-23 MED ORDER — SODIUM CHLORIDE 0.9 % IV SOLN: 250.0000 mL | INTRAVENOUS | Status: DC | PRN

## 2017-12-23 MED ORDER — LORAZEPAM 2 MG/ML IJ SOLN: 1.0000 mg | Freq: Four times a day (QID) | INTRAMUSCULAR | Status: DC | PRN

## 2017-12-23 MED ORDER — ACETAMINOPHEN 325 MG PO TABS
650.0000 mg | ORAL_TABLET | Freq: Four times a day (QID) | ORAL | Status: DC | PRN
Start: 2017-12-23 — End: 2017-12-25

## 2017-12-23 MED ORDER — ALBUTEROL SULFATE (2.5 MG/3ML) 0.083% IN NEBU
5.0000 mg | INHALATION_SOLUTION | Freq: Once | RESPIRATORY_TRACT | Status: AC
  Administered 2017-12-23: 5 mg via RESPIRATORY_TRACT
  Filled 2017-12-23: qty 6

## 2017-12-23 MED ORDER — VITAMIN B-1 100 MG PO TABS
100.0000 mg | ORAL_TABLET | Freq: Every day | ORAL | Status: DC
  Administered 2017-12-23 – 2017-12-25 (×3): 100 mg via ORAL
  Filled 2017-12-23 (×3): qty 1

## 2017-12-23 MED ORDER — LORAZEPAM 1 MG PO TABS: 1.0000 mg | ORAL_TABLET | Freq: Four times a day (QID) | ORAL | Status: DC | PRN

## 2017-12-23 MED ORDER — SODIUM CHLORIDE 0.9% FLUSH: 3.0000 mL | INTRAVENOUS | Status: DC | PRN

## 2017-12-23 MED ORDER — LOSARTAN POTASSIUM 25 MG PO TABS
25.0000 mg | ORAL_TABLET | Freq: Every day | ORAL | Status: DC
  Filled 2017-12-23: qty 1

## 2017-12-23 MED ORDER — FOLIC ACID 1 MG PO TABS
1.0000 mg | ORAL_TABLET | Freq: Every day | ORAL | Status: DC
  Administered 2017-12-23 – 2017-12-25 (×3): 1 mg via ORAL
  Filled 2017-12-23 (×3): qty 1

## 2017-12-23 MED ORDER — LORAZEPAM 2 MG/ML IJ SOLN: 0.0000 mg | Freq: Two times a day (BID) | INTRAMUSCULAR | Status: DC

## 2017-12-23 MED ORDER — ENOXAPARIN SODIUM 40 MG/0.4ML ~~LOC~~ SOLN
40.0000 mg | SUBCUTANEOUS | Status: DC
  Administered 2017-12-23: 40 mg via SUBCUTANEOUS
  Filled 2017-12-23: qty 0.4

## 2017-12-23 MED ORDER — NORGESTIMATE-ETH ESTRADIOL 0.25-35 MG-MCG PO TABS
1.0000 | ORAL_TABLET | Freq: Every day | ORAL | Status: DC
  Administered 2017-12-24: 1 via ORAL

## 2017-12-23 MED ORDER — LORAZEPAM 2 MG/ML IJ SOLN: 0.0000 mg | Freq: Four times a day (QID) | INTRAMUSCULAR | Status: DC

## 2017-12-23 MED ORDER — IOPAMIDOL (ISOVUE-370) INJECTION 76%
100.0000 mL | Freq: Once | INTRAVENOUS | Status: AC | PRN
  Administered 2017-12-23: 80 mL via INTRAVENOUS

## 2017-12-23 MED ORDER — ACETAMINOPHEN 650 MG RE SUPP: 650.0000 mg | Freq: Four times a day (QID) | RECTAL | Status: DC | PRN

## 2017-12-23 MED ORDER — THIAMINE HCL 100 MG/ML IJ SOLN: 100.0000 mg | Freq: Every day | INTRAMUSCULAR | Status: DC

## 2017-12-23 NOTE — ED Notes (Signed)
Called floor giving nurse 20 mins did not know she was getting pt

## 2017-12-23 NOTE — Telephone Encounter (Signed)
Pt called upset and tearful with c/o SOB and cough. Pt is unable to lie flat and states her SOB is present with and without exertion. Pt is concerned it may be an infection. Pt stated that she could not sleep last night because of the SOB and cough. Pt stated that cough is productive and the color of the phlegm was light yellow and now is foamy phlegm. Pt stated she is not having chest pain, dizziness, runny nose or fever.  Pt stated that she was seen in the ED 11/30/17 and was dx with pericarditis. Upon review of the chart pt was seen for chest pain but CXR indicated the "possibility of pericardial effusion or cardiomegaly." Pt states the SOB has been intermittent until last night. Care advice given per protocol and pt verbalized understanding.  Advised pt to go to the ED. Called PCP practice and spoke with Carollee Herter who agreed with disposition. Pt states that she will be going to [facility] ED.  Reason for Disposition . [1] MODERATE difficulty breathing (e.g., speaks in phrases, SOB even at rest, pulse 100-120) AND [2] NEW-onset or WORSE than normal  Answer Assessment - Initial Assessment Questions 1. RESPIRATORY STATUS: "Describe your breathing?" (e.g., wheezing, shortness of breath, unable to speak, severe coughing)      Shortness of breath 2. ONSET: "When did this breathing problem begin?"      Last night but SOB has been on and off since chest pain 3. PATTERN "Does the difficult breathing come and go, or has it been constant since it started?"      Constant gets worse when moving around 4. SEVERITY: "How bad is your breathing?" (e.g., mild, moderate, severe)    - MILD: No SOB at rest, mild SOB with walking, speaks normally in sentences, can lay down, no retractions, pulse < 100.    - MODERATE: SOB at rest, SOB with minimal exertion and prefers to sit, cannot lie down flat, speaks in phrases, mild retractions, audible wheezing, pulse 100-120.    - SEVERE: Very SOB at rest, speaks in single  words, struggling to breathe, sitting hunched forward, retractions, pulse > 120      moderate 5. RECURRENT SYMPTOM: "Have you had difficulty breathing before?" If so, ask: "When was the last time?" and "What happened that time?"      No 6. CARDIAC HISTORY: "Do you have any history of heart disease?" (e.g., heart attack, angina, bypass surgery, angioplasty)      H/o pericarditis 1 month ago 7. LUNG HISTORY: "Do you have any history of lung disease?"  (e.g., pulmonary embolus, asthma, emphysema)     no 8. CAUSE: "What do you think is causing the breathing problem?"      Maybe an infection 9. OTHER SYMPTOMS: "Do you have any other symptoms? (e.g., dizziness, runny nose, cough, chest pain, fever)     Cough coughing up yellow thin secretions then went to a foamy consistency 10. PREGNANCY: "Is there any chance you are pregnant?" "When was your last menstrual period?"       No LMP: 1.5 weeks ago 11. TRAVEL: "Have you traveled out of the country in the last month?" (e.g., travel history, exposures)       no  Protocols used: BREATHING DIFFICULTY-A-AH

## 2017-12-23 NOTE — ED Provider Notes (Signed)
[facility]PT Provider Note   CSN: [id number] Arrival date & time: 12/23/17  0955     History   Chief Complaint Chief Complaint  Patient presents with  . Shortness of Breath    HPI Tina Colon is a 37 y.o. female.  HPI  Patient presents with dyspnea, chest pain. Onset unclear, but seemingly over the past day the patient has had increasing discomfort, and difficulty breathing, particularly with activity. Patient was seen at our affiliated facility few weeks ago, notes that she has gotten much worse than during that encounter. No fever, no syncope, no relief with anything, and symptoms are worse with exertion and activity. She denies a history of asthma, acknowledges a history of alcohol use.   Past Medical History:  Diagnosis Date  . ETOH abuse   . Gastritis   . GERD (gastroesophageal reflux disease)   . Pancreatitis     Patient Active Problem List   Diagnosis Date Noted  . Routine general medical examination at a health care facility 10/02/2017  . Gastritis 06/07/2015  . Alcohol abuse 06/07/2015  . Cannabis abuse 06/07/2015    Past Surgical History:  Procedure Laterality Date  . WISDOM TOOTH EXTRACTION       OB History   None      Home Medications    Prior to Admission medications   Medication Sig Start Date End Date Taking? Authorizing Provider  acetaminophen (TYLENOL) 325 MG tablet Take 650 mg by mouth every 6 (six) hours as needed for moderate pain.   Yes [provider]  norgestimate-ethinyl estradiol (ORTHO-CYCLEN,SPRINTEC,PREVIFEM) 0.25-35 MG-MCG tablet Take 1 tablet by mouth daily.   Yes [provider]    Family History Family History  Problem Relation Age of Onset  . Stomach cancer Maternal Grandfather   . Diabetes Unknown   . Heart disease Unknown   . Irritable bowel syndrome Unknown     Social History Social History   Tobacco Use  . Smoking status: Former Games developer  . Smokeless  tobacco: Never Used  Substance Use Topics  . Alcohol use: Yes    Comment: daily use- wine and beer  . Drug use: Yes    Types: Marijuana    Comment: occasionally     Allergies   Penicillins   Review of Systems Review of Systems  Constitutional:       Per HPI, otherwise negative  HENT:       Per HPI, otherwise negative  Respiratory:       Per HPI, otherwise negative  Cardiovascular:       Per HPI, otherwise negative  Gastrointestinal: Negative for vomiting.  Endocrine:       Negative aside from HPI  Genitourinary:       Neg aside from HPI   Musculoskeletal:       Per HPI, otherwise negative  Skin: Negative.   Neurological: Positive for weakness. Negative for syncope.     Physical Exam Updated Vital Signs BP (!) 124/97   Pulse 100   Temp 98.5 F (36.9 C) (Oral)   Resp (!) 22   LMP 12/13/2017 (Approximate)   SpO2 97%   Physical Exam  Constitutional: She is oriented to person, place, and time. She appears well-developed and well-nourished. No distress.  Uncomfortable appearing young female awake and alert  HENT:  Head: Normocephalic and atraumatic.  Eyes: Conjunctivae and EOM are normal.  Cardiovascular: Regular rhythm. Tachycardia present.  Pulmonary/Chest: No stridor. Tachypnea noted. She is in respiratory  distress. She has decreased breath sounds.  Abdominal: She exhibits no distension.  Musculoskeletal: She exhibits no edema.  Neurological: She is alert and oriented to person, place, and time. No cranial nerve deficit.  Skin: Skin is warm and dry.  Psychiatric: She has a normal mood and affect.  Nursing note and vitals reviewed.    ED Treatments / Results  Labs (all labs ordered are listed, but only abnormal results are displayed) Labs Reviewed  COMPREHENSIVE METABOLIC PANEL - Abnormal; Notable for the following components:      Result Value   Potassium 3.3 (*)    Glucose, Bld 103 (*)    Total Bilirubin 0.2 (*)    All other components within  normal limits  D-DIMER, QUANTITATIVE (NOT AT [facility]) - Abnormal; Notable for the following components:   D-Dimer, Quant 1.46 (*)    All other components within normal limits  LIPASE, BLOOD  ETHANOL  CBC WITH DIFFERENTIAL/PLATELET  BRAIN NATRIURETIC PEPTIDE    EKG EKG Interpretation  Date/Time:  Wednesday December 23 2017 10:01:59 EDT Ventricular Rate:  110 PR Interval:    QRS Duration: 61 QT Interval:  364 QTC Calculation: 493 R Axis:   92 Text Interpretation:  Sinus tachycardia Borderline right axis deviation Anteroseptal infarct, old T wave abnormality Abnormal ekg Confirmed by Gerhard Munch [phone]) on 12/23/2017 10:27:00 AM   Radiology Dg Chest 2 View  Result Date: 12/23/2017 CLINICAL DATA:  Shortness of breath EXAM: CHEST - 2 VIEW COMPARISON:  November 30, 2017 chest radiograph and chest CT FINDINGS: There is mild cardiomegaly, stable. Pulmonary vascularity is normal. No edema or consolidation. No adenopathy. No bone lesions. There is slight midthoracic dextroscoliosis. IMPRESSION: No edema or consolidation.  Stable cardiomegaly. Electronically Signed   By: Bretta Bang III M.D.   On: 12/23/2017 10:44   Ct Angio Chest Pe W/cm &/or Wo Cm  Result Date: 12/23/2017 CLINICAL DATA:  Chest pain with shortness of breath. EXAM: CT ANGIOGRAPHY CHEST WITH CONTRAST TECHNIQUE: Multidetector CT imaging of the chest was performed using the standard protocol during bolus administration of intravenous contrast. Multiplanar CT image reconstructions and MIPs were obtained to evaluate the vascular anatomy. CONTRAST:  80mL ISOVUE-370 IOPAMIDOL (ISOVUE-370) INJECTION 76% COMPARISON:  CTA chest 11/30/2017 FINDINGS: Cardiovascular: --Pulmonary arteries: Contrast injection is sufficient to demonstrate satisfactory opacification of the pulmonary arteries to the segmental level. There is no pulmonary embolus. The main pulmonary artery is within normal limits for size. --Aorta: Limited opacification of the aorta  due to bolus timing optimization for the pulmonary arteries. Conventional 3 vessel aortic branching pattern. The aortic course and caliber are normal. There is no aortic atherosclerosis. --Heart: Mild cardiomegaly. No pericardial effusion. Mediastinum/Nodes: No mediastinal, hilar or axillary lymphadenopathy. The visualized thyroid and thoracic esophageal course are unremarkable. Lungs/Pleura: There are medium-sized pleural effusions. Bibasilar atelectasis. No focal consolidation. Mild pulmonary edema. Upper Abdomen: Contrast bolus timing is not optimized for evaluation of the abdominal organs. Within this limitation, the visualized organs of the upper abdomen are normal. Musculoskeletal: No chest wall abnormality. No acute or significant osseous findings. Review of the MIP images confirms the above findings. IMPRESSION: 1. No pulmonary embolus. 2. Medium-sized bilateral pleural effusions with mild pulmonary edema. Mild cardiomegaly. Electronically Signed   By: Deatra Robinson M.D.   On: 12/23/2017 15:53    Procedures Procedures (including critical care time)  Medications Ordered in ED Medications  furosemide (LASIX) injection 20 mg (has no administration in time range)  albuterol (PROVENTIL) (2.5 MG/3ML) 0.083% nebulizer solution  5 mg (5 mg Nebulization Given 12/23/17 1045)  iopamidol (ISOVUE-370) 76 % injection 100 mL (80 mLs Intravenous Contrast Given 12/23/17 1512)     Initial Impression / Assessment and Plan / ED Course  I have reviewed the triage vital signs and the nursing notes.  Pertinent labs & imaging results that were available during my care of the patient were reviewed by me and considered in my medical decision making (see chart for details).   On repeat exam the patient is awake and alert.  Continues to have dyspnea, increased work of breathing, tachypnea. D-dimer positive 4:11 PM This patient presents with dyspnea, fatigue, and after the initial studies were conducted, and on repeat  evaluation she notes that she has had some generalized weakness, symptoms have been progressive. Initial findings reassuring, no evidence for PE, low suspicion for ACS given her youth, absence of risk factors beyond obesity. Patient has no history of heart failure, but x-ray, CT, and physical exam findings are concerning for heart failure. Patient received initial Lasix here, is awaiting BNP, repeat evaluation to determine admission for echocardiogram, versus continue Lasix as an outpatient with echocardiogram as an outpatient. Dr.Knapp will follow the patient's course.   Final Clinical Impressions(s) / ED Diagnoses  Shortness of breath   Gerhard Munch, MD 12/23/17 [phone]

## 2017-12-23 NOTE — ED Triage Notes (Signed)
Patient here from home with complaints of sudden onset of SOB x2 days. Increased with walking. Denies hx of asthma. Cough.

## 2017-12-23 NOTE — Discharge Instructions (Signed)
As discussed, it is important that you follow-up with your primary care physician. Please call tomorrow and discuss both a follow-up appointment and outpatient echocardiogram.

## 2017-12-23 NOTE — H&P (Signed)
TRH H&P   Patient Demographics:    Tina Colon, is a 37 y.o. female  MRN: [id number]   DOB - [birth year]  Admit Date - 12/23/2017  Outpatient Primary MD for the patient is Sagardia, Eilleen Kempf, MD  Referring MD/NP/PA:   Iantha Fallen  Outpatient Specialists:     Patient coming from: home  Chief Complaint  Patient presents with  . Shortness of Breath      HPI:    Tina Colon  is a 37 y.o. female, Genella Rife, h/o pancreatitis, h/o etoh abuse, presented w c/o dyspnea.  Pt states sob since at least 6/24. + orthopnea.  No weight gain.  No leg swelling.   In Ed,  CTA chest  IMPRESSION: 1. No pulmonary embolus. 2. Medium-sized bilateral pleural effusions with mild pulmonary edema. Mild cardiomegaly.  Na 145, K 3.3, Bun 13, Creatinine 0.69 Ast 32, Alt 32 Lipase 25   D dimer 1.46  Wbc 7.3, hgb 13.1, Plt 239  BNP 411.2  Pt will be admitted for CHF, unclear etiology.     Review of systems:    In addition to the HPI above,  No Fever-chills, No Headache, No changes with Vision or hearing, No problems swallowing food or Liquids, No Chest pain,  No Abdominal pain, No Nausea or Vommitting, Bowel movements are regular, No Blood in stool or Urine, No dysuria, No new skin rashes or bruises, No new joints pains-aches,  No new weakness, tingling, numbness in any extremity, No recent weight gain or loss, No polyuria, polydypsia or polyphagia, No significant Mental Stressors.  A full 10 point Review of Systems was done, except as stated above, all other Review of Systems were negative.   With Past History of the following :    Past Medical History:  Diagnosis Date  . ETOH abuse   . Gastritis   . GERD (gastroesophageal reflux disease)   . Pancreatitis       Past Surgical History:  Procedure Laterality Date  . WISDOM TOOTH EXTRACTION        Social History:       Social History   Tobacco Use  . Smoking status: Former Games developer  . Smokeless tobacco: Never Used  Substance Use Topics  . Alcohol use: Yes    Comment: daily use- wine and beer     Lives - at home  Mobility - walks by self   Family History :     Family History  Problem Relation Age of Onset  . Stomach cancer Maternal Grandfather   . Diabetes Unknown   . Heart disease Unknown   . Irritable bowel syndrome Unknown       Home Medications:   Prior to Admission medications   Medication Sig Start Date End Date Taking? Authorizing Provider  acetaminophen (TYLENOL) 325 MG tablet Take 650 mg by mouth every 6 (six) hours as needed for moderate  pain.   Yes [provider]  norgestimate-ethinyl estradiol (ORTHO-CYCLEN,SPRINTEC,PREVIFEM) 0.25-35 MG-MCG tablet Take 1 tablet by mouth daily.   Yes [provider]  furosemide (LASIX) 20 MG tablet Take 1 tablet (20 mg total) by mouth daily. 12/23/17   Gerhard Munch, MD     Allergies:     Allergies  Allergen Reactions  . Penicillins Anaphylaxis    Has patient had a PCN reaction causing immediate rash, facial/tongue/throat swelling, SOB or lightheadedness with hypotension:Yes Has patient had a PCN reaction causing severe rash involving mucus membranes or skin necrosis:unknown Has patient had a PCN reaction that required hospitalization:Yes Has patient had a PCN reaction occurring within the last 10 years:No If all of the above answers are "NO", then may proceed with Cephalosporin use.      Physical Exam:   Vitals  Blood pressure (!) 124/97, pulse 100, temperature 98.5 F (36.9 C), temperature source Oral, resp. rate (!) 22, last menstrual period 12/13/2017, SpO2 97 %.   1. General  lying in bed in NAD,    2. Normal affect and insight, Not Suicidal or Homicidal, Awake Alert, Oriented X 3.  3. No F.N deficits, ALL C.Nerves Intact, Strength 5/5 all 4 extremities, Sensation intact all 4 extremities, Plantars down  going.  4. Ears and Eyes appear Normal, Conjunctivae clear, PERRLA. Moist Oral Mucosa.  5. Supple Neck, slight JVD, No cervical lymphadenopathy appriciated, No Carotid Bruits.  6. Symmetrical Chest wall movement, Good air movement bilaterally, slight crackles bilateral base, no wheezing,   7. RRR, No Gallops, Rubs or Murmurs, No Parasternal Heave.  8. Positive Bowel Sounds, Abdomen Soft, No tenderness, No organomegaly appriciated,No rebound -guarding or rigidity.  9.  No Cyanosis, Normal Skin Turgor, No Skin Rash or Bruise.  10. Good muscle tone,  joints appear normal , no effusions, Normal ROM.  11. No Palpable Lymph Nodes in Neck or Axillae      Data Review:    CBC Recent Labs  Lab 12/23/17 1243  WBC 7.3  HGB 13.1  HCT 39.1  PLT 239  MCV 89.3  MCH 29.9  MCHC 33.5  RDW 13.4  LYMPHSABS 1.7  MONOABS 0.4  EOSABS 0.1  BASOSABS 0.1   ------------------------------------------------------------------------------------------------------------------  Chemistries  Recent Labs  Lab 12/23/17 1243  NA 145  K 3.3*  CL 111  CO2 23  GLUCOSE 103*  BUN 13  CREATININE 0.69  CALCIUM 9.3  AST 32  ALT 32  ALKPHOS 38  BILITOT 0.2*   ------------------------------------------------------------------------------------------------------------------ CrCl cannot be calculated (Unknown ideal weight.). ------------------------------------------------------------------------------------------------------------------ No results for input(s): TSH, T4TOTAL, T3FREE, THYROIDAB in the last 72 hours.  Invalid input(s): FREET3  Coagulation profile No results for input(s): INR, PROTIME in the last 168 hours. ------------------------------------------------------------------------------------------------------------------- Recent Labs    12/23/17 1243  DDIMER 1.46*    -------------------------------------------------------------------------------------------------------------------  Cardiac Enzymes No results for input(s): CKMB, TROPONINI, MYOGLOBIN in the last 168 hours.  Invalid input(s): CK ------------------------------------------------------------------------------------------------------------------    Component Value Date/Time   BNP 411.2 (H) 12/23/2017 1243     ---------------------------------------------------------------------------------------------------------------  Urinalysis    Component Value Date/Time   COLORURINE YELLOW 05/30/2015 1702   APPEARANCEUR CLEAR 05/30/2015 1702   LABSPEC 1.013 05/30/2015 1702   PHURINE 7.5 05/30/2015 1702   GLUCOSEU NEGATIVE 05/30/2015 1702   HGBUR LARGE (A) 05/30/2015 1702   BILIRUBINUR NEGATIVE 05/30/2015 1702   BILIRUBINUR neg 11/29/2013 1015   KETONESUR 40 (A) 05/30/2015 1702   PROTEINUR NEGATIVE 05/30/2015 1702   UROBILINOGEN 0.2 11/05/2014 1335   NITRITE  NEGATIVE 05/30/2015 1702   LEUKOCYTESUR NEGATIVE 05/30/2015 1702    ----------------------------------------------------------------------------------------------------------------   Imaging Results:    Dg Chest 2 View  Result Date: 12/23/2017 CLINICAL DATA:  Shortness of breath EXAM: CHEST - 2 VIEW COMPARISON:  November 30, 2017 chest radiograph and chest CT FINDINGS: There is mild cardiomegaly, stable. Pulmonary vascularity is normal. No edema or consolidation. No adenopathy. No bone lesions. There is slight midthoracic dextroscoliosis. IMPRESSION: No edema or consolidation.  Stable cardiomegaly. Electronically Signed   By: Bretta Bang III M.D.   On: 12/23/2017 10:44   Ct Angio Chest Pe W/cm &/or Wo Cm  Result Date: 12/23/2017 CLINICAL DATA:  Chest pain with shortness of breath. EXAM: CT ANGIOGRAPHY CHEST WITH CONTRAST TECHNIQUE: Multidetector CT imaging of the chest was performed using the standard protocol during bolus  administration of intravenous contrast. Multiplanar CT image reconstructions and MIPs were obtained to evaluate the vascular anatomy. CONTRAST:  33mL ISOVUE-370 IOPAMIDOL (ISOVUE-370) INJECTION 76% COMPARISON:  CTA chest 11/30/2017 FINDINGS: Cardiovascular: --Pulmonary arteries: Contrast injection is sufficient to demonstrate satisfactory opacification of the pulmonary arteries to the segmental level. There is no pulmonary embolus. The main pulmonary artery is within normal limits for size. --Aorta: Limited opacification of the aorta due to bolus timing optimization for the pulmonary arteries. Conventional 3 vessel aortic branching pattern. The aortic course and caliber are normal. There is no aortic atherosclerosis. --Heart: Mild cardiomegaly. No pericardial effusion. Mediastinum/Nodes: No mediastinal, hilar or axillary lymphadenopathy. The visualized thyroid and thoracic esophageal course are unremarkable. Lungs/Pleura: There are medium-sized pleural effusions. Bibasilar atelectasis. No focal consolidation. Mild pulmonary edema. Upper Abdomen: Contrast bolus timing is not optimized for evaluation of the abdominal organs. Within this limitation, the visualized organs of the upper abdomen are normal. Musculoskeletal: No chest wall abnormality. No acute or significant osseous findings. Review of the MIP images confirms the above findings. IMPRESSION: 1. No pulmonary embolus. 2. Medium-sized bilateral pleural effusions with mild pulmonary edema. Mild cardiomegaly. Electronically Signed   By: Deatra Robinson M.D.   On: 12/23/2017 15:53       Assessment & Plan:    Principal Problem:   Bilateral pleural effusion Active Problems:   Tachycardia   Hypokalemia    CHF Bilateral pleural effusion Tele Trop I q6h x3 Check Tsh Check cardiac echo Lasix 40mg  iv qday Start losartan 25mg  po qday  Hypokalemia Replete Check cmp in am  Tachycardia Check TSH Trop I q6h x3 Viral titer for cocsackie and echo  virus Check cardiac echo  Etoh abuse Pt denies withdrawal Thiamine CIWA    DVT Prophylaxis Lovenox - SCDs  AM Labs Ordered, also please review Full Orders  Family Communication: Admission, patients condition and plan of care including tests being ordered have been discussed with the patient who indicate understanding and agree with the plan and Code Status.  Code Status  FULL CODE  Likely DC to  home  Condition GUARDED    Consults called: none  Admission status: inpatient   Time spent in minutes : 60   Pearson Grippe M.D on 12/23/2017 at 7:21 PM  Between 7am to 7pm - Pager - [phone]   After 7pm go to [email or web address] - password [facility]  [facility] - Office  [phone]

## 2017-12-23 NOTE — ED Provider Notes (Signed)
Patient presented to the emergency room for persistent shortness of breath.  Findings consistent with new onset chf.  Pt was given a diuretic.  Some response but still short of breath.  Pt would prefer admission.   Linwood Dibbles, MD 12/23/17 [phone]

## 2017-12-23 NOTE — ED Notes (Signed)
ED TO INPATIENT HANDOFF REPORT  Name/Age/Gender Tina Colon 37 y.o. female  Code Status    Code Status Orders  (From admission, onward)        Start     Ordered   12/23/17 1933  Full code  Continuous     12/23/17 1936    Code Status History    This patient has a current code status but no historical code status.      Home/SNF/Other Home  Chief Complaint SOB   Level of Care/Admitting Diagnosis ED Disposition    ED Disposition Condition Comment   Admit  Hospital Area: [place] [[id number]]  Level of Care: Telemetry [5]  Admit to tele based on following criteria: Monitor for Ischemic changes  Diagnosis: CHF (congestive heart failure) [facility]) [[id number]]  Admitting Physician: Jani Gravel [[id number]]  Attending Physician: Jani Gravel [phone]  Estimated length of stay: past midnight tomorrow  Certification:: I certify this patient will need inpatient services for at least 2 midnights  PT Class (Do Not Modify): Inpatient [101]  PT Acc Code (Do Not Modify): Private [1]       Medical History Past Medical History:  Diagnosis Date  . ETOH abuse   . Gastritis   . GERD (gastroesophageal reflux disease)   . Pancreatitis     Allergies Allergies  Allergen Reactions  . Penicillins Anaphylaxis    Has patient had a PCN reaction causing immediate rash, facial/tongue/throat swelling, SOB or lightheadedness with hypotension:Yes Has patient had a PCN reaction causing severe rash involving mucus membranes or skin necrosis:unknown Has patient had a PCN reaction that required hospitalization:Yes Has patient had a PCN reaction occurring within the last 10 years:No If all of the above answers are "NO", then may proceed with Cephalosporin use.     IV Location/Drains/Wounds Patient Lines/Drains/Airways Status   Active Line/Drains/Airways    Name:   Placement date:   Placement time:   Site:   Days:   Peripheral IV 12/23/17 Left;Medial Forearm   12/23/17    1241     Forearm   less than 1          Labs/Imaging Results for orders placed or performed during the hospital encounter of 12/23/17 (from the past 48 hour(s))  Comprehensive metabolic panel     Status: Abnormal   Collection Time: 12/23/17 12:43 PM  Result Value Ref Range   Sodium 145 135 - 145 mmol/L   Potassium 3.3 (L) 3.5 - 5.1 mmol/L   Chloride 111 98 - 111 mmol/L    Comment: Please note change in reference range.   CO2 23 22 - 32 mmol/L   Glucose, Bld 103 (H) 70 - 99 mg/dL    Comment: Please note change in reference range.   BUN 13 6 - 20 mg/dL    Comment: Please note change in reference range.   Creatinine, Ser 0.69 0.44 - 1.00 mg/dL   Calcium 9.3 8.9 - 10.3 mg/dL   Total Protein 7.1 6.5 - 8.1 g/dL   Albumin 4.0 3.5 - 5.0 g/dL   AST 32 15 - 41 U/L   ALT 32 0 - 44 U/L    Comment: Please note change in reference range.   Alkaline Phosphatase 38 38 - 126 U/L   Total Bilirubin 0.2 (L) 0.3 - 1.2 mg/dL   GFR calc non Af Amer >60 >60 mL/min   GFR calc Af Amer >60 >60 mL/min    Comment: (NOTE) The eGFR has been calculated using the  CKD EPI equation. This calculation has not been validated in all clinical situations. eGFR's persistently <60 mL/min signify possible Chronic Kidney Disease.    Anion gap 11 5 - 15    Comment: Performed at [facility], [place] [address] [place], [place] [address]  Lipase, blood     Status: None   Collection Time: 12/23/17 12:43 PM  Result Value Ref Range   Lipase 25 11 - 51 U/L    Comment: Performed at [facility], [place] [address], [place], [place] [address]  Ethanol     Status: None   Collection Time: 12/23/17 12:43 PM  Result Value Ref Range   Alcohol, Ethyl (B) <10 <10 mg/dL    Comment: (NOTE) Lowest detectable limit for serum alcohol is 10 mg/dL. For medical purposes only. Performed at [facility], [place] [address] [place], [place] [address]   D-dimer, quantitative     Status: Abnormal    Collection Time: 12/23/17 12:43 PM  Result Value Ref Range   D-Dimer, Quant 1.46 (H) 0.00 - 0.50 ug/mL-FEU    Comment: (NOTE) At the manufacturer cut-off of 0.50 ug/mL FEU, this assay has been documented to exclude PE with a sensitivity and negative predictive value of 97 to 99%.  At this time, this assay has not been approved by the FDA to exclude DVT/VTE. Results should be correlated with clinical presentation. Performed at [facility], [place] [address] [place], [place] [address]   CBC with Differential     Status: None   Collection Time: 12/23/17 12:43 PM  Result Value Ref Range   WBC 7.3 4.0 - 10.5 K/uL   RBC 4.38 3.87 - 5.11 MIL/uL   Hemoglobin 13.1 12.0 - 15.0 g/dL   HCT 39.1 36.0 - 46.0 %   MCV 89.3 78.0 - 100.0 fL   MCH 29.9 26.0 - 34.0 pg   MCHC 33.5 30.0 - 36.0 g/dL   RDW 13.4 11.5 - 15.5 %   Platelets 239 150 - 400 K/uL   Neutrophils Relative % 69 %   Neutro Abs 5.1 1.7 - 7.7 K/uL   Lymphocytes Relative 24 %   Lymphs Abs 1.7 0.7 - 4.0 K/uL   Monocytes Relative 5 %   Monocytes Absolute 0.4 0.1 - 1.0 K/uL   Eosinophils Relative 1 %   Eosinophils Absolute 0.1 0.0 - 0.7 K/uL   Basophils Relative 1 %   Basophils Absolute 0.1 0.0 - 0.1 K/uL    Comment: Performed at [facility], [place] [address] [place], [place] [address]  Brain natriuretic peptide     Status: Abnormal   Collection Time: 12/23/17 12:43 PM  Result Value Ref Range   B Natriuretic Peptide 411.2 (H) 0.0 - 100.0 pg/mL    Comment: Performed at [facility], [place] [address] [place],  [address]   Dg Chest 2 View  Result Date: 12/23/2017 CLINICAL DATA:  Shortness of breath EXAM: CHEST - 2 VIEW COMPARISON:  November 30, 2017 chest radiograph and chest CT FINDINGS: There is mild cardiomegaly, stable. Pulmonary vascularity is normal. No edema or consolidation. No adenopathy. No bone lesions. There is slight midthoracic dextroscoliosis. IMPRESSION: No  edema or consolidation.  Stable cardiomegaly. Electronically Signed   By: Lowella Grip III M.D.   On: 12/23/2017 10:44   Ct Angio Chest Pe W/cm &/or Wo Cm  Result Date: 12/23/2017 CLINICAL DATA:  Chest pain with shortness of breath. EXAM: CT ANGIOGRAPHY CHEST WITH CONTRAST  TECHNIQUE: Multidetector CT imaging of the chest was performed using the standard protocol during bolus administration of intravenous contrast. Multiplanar CT image reconstructions and MIPs were obtained to evaluate the vascular anatomy. CONTRAST:  53m ISOVUE-370 IOPAMIDOL (ISOVUE-370) INJECTION 76% COMPARISON:  CTA chest 11/30/2017 FINDINGS: Cardiovascular: --Pulmonary arteries: Contrast injection is sufficient to demonstrate satisfactory opacification of the pulmonary arteries to the segmental level. There is no pulmonary embolus. The main pulmonary artery is within normal limits for size. --Aorta: Limited opacification of the aorta due to bolus timing optimization for the pulmonary arteries. Conventional 3 vessel aortic branching pattern. The aortic course and caliber are normal. There is no aortic atherosclerosis. --Heart: Mild cardiomegaly. No pericardial effusion. Mediastinum/Nodes: No mediastinal, hilar or axillary lymphadenopathy. The visualized thyroid and thoracic esophageal course are unremarkable. Lungs/Pleura: There are medium-sized pleural effusions. Bibasilar atelectasis. No focal consolidation. Mild pulmonary edema. Upper Abdomen: Contrast bolus timing is not optimized for evaluation of the abdominal organs. Within this limitation, the visualized organs of the upper abdomen are normal. Musculoskeletal: No chest wall abnormality. No acute or significant osseous findings. Review of the MIP images confirms the above findings. IMPRESSION: 1. No pulmonary embolus. 2. Medium-sized bilateral pleural effusions with mild pulmonary edema. Mild cardiomegaly. Electronically Signed   By: KUlyses JarredM.D.   On: 12/23/2017 15:53     Pending Labs Unresulted Labs (From admission, onward)   Start     Ordered   12/30/17 0500  Creatinine, serum  (enoxaparin (LOVENOX)    CrCl >/= 30 ml/min)  Weekly,   R    Comments:  while on enoxaparin therapy    12/23/17 1936   12/24/17 0500  Comprehensive metabolic panel  Tomorrow morning,   R     12/23/17 1936   12/24/17 0500  CBC  Tomorrow morning,   R     12/23/17 1936   12/23/17 1937  Coxsackie A virus antibodies  Once,   R     12/23/17 1937   12/23/17 1937  Coxsackie B virus antibodies  Once,   R     12/23/17 1937   12/23/17 1934  TSH  Once,   R     12/23/17 1936   12/23/17 1934  Troponin I  Now then every 6 hours,   R     12/23/17 1936      Vitals/Pain Today's Vitals   12/23/17 1300 12/23/17 1330 12/23/17 1400 12/23/17 1407  BP: (!) 137/95 (!) 137/97 (!) 124/97   Pulse: 100 (!) 101 100   Resp: (!) 25 (!) 34 (!) 22   Temp:      TempSrc:      SpO2: 97% 94% 97%   PainSc:    0-No pain    Isolation Precautions No active isolations  Medications Medications  enoxaparin (LOVENOX) injection 40 mg (has no administration in time range)  sodium chloride flush (NS) 0.9 % injection 3 mL (has no administration in time range)  sodium chloride flush (NS) 0.9 % injection 3 mL (has no administration in time range)  0.9 %  sodium chloride infusion (has no administration in time range)  acetaminophen (TYLENOL) tablet 650 mg (has no administration in time range)    Or  acetaminophen (TYLENOL) suppository 650 mg (has no administration in time range)  furosemide (LASIX) injection 40 mg (has no administration in time range)  potassium chloride SA (K-DUR,KLOR-CON) CR tablet 40 mEq (has no administration in time range)  losartan (COZAAR) tablet 25 mg (has no administration in time range)  LORazepam (ATIVAN) tablet 1 mg (has no administration in time range)    Or  LORazepam (ATIVAN) injection 1 mg (has no administration in time range)  thiamine (VITAMIN B-1) tablet 100 mg (has  no administration in time range)    Or  thiamine (B-1) injection 100 mg (has no administration in time range)  folic acid (FOLVITE) tablet 1 mg (has no administration in time range)  multivitamin with minerals tablet 1 tablet (has no administration in time range)  LORazepam (ATIVAN) injection 0-4 mg (has no administration in time range)    Followed by  LORazepam (ATIVAN) injection 0-4 mg (has no administration in time range)  albuterol (PROVENTIL) (2.5 MG/3ML) 0.083% nebulizer solution 5 mg (5 mg Nebulization Given 12/23/17 1045)  iopamidol (ISOVUE-370) 76 % injection 100 mL (80 mLs Intravenous Contrast Given 12/23/17 1512)  furosemide (LASIX) injection 20 mg (20 mg Intravenous Given 12/23/17 1725)    Mobility walks

## 2017-12-23 NOTE — ED Notes (Signed)
Pt care assumed.  Verbal report obtained.  Pt just finished her neb tx. She reports feeling a little better.  Denies any pain.  She ambulated to the BR with steady gait.  She is A&O x 4.  In NAD

## 2017-12-24 ENCOUNTER — Inpatient Hospital Stay (HOSPITAL_COMMUNITY): Payer: BLUE CROSS/BLUE SHIELD

## 2017-12-24 DIAGNOSIS — I340 Nonrheumatic mitral (valve) insufficiency: Secondary | ICD-10-CM

## 2017-12-24 LAB — RAPID URINE DRUG SCREEN, HOSP PERFORMED
AMPHETAMINES: NOT DETECTED
Benzodiazepines: NOT DETECTED
Cocaine: NOT DETECTED
Opiates: NOT DETECTED
TETRAHYDROCANNABINOL: NOT DETECTED

## 2017-12-24 LAB — CBC
HCT: 35.6 % — ABNORMAL LOW (ref 36.0–46.0)
HEMOGLOBIN: 11.9 g/dL — AB (ref 12.0–15.0)
MCH: 29.8 pg (ref 26.0–34.0)
MCHC: 33.4 g/dL (ref 30.0–36.0)
MCV: 89.2 fL (ref 78.0–100.0)
PLATELETS: 262 10*3/uL (ref 150–400)
RBC: 3.99 MIL/uL (ref 3.87–5.11)
RDW: 13.5 % (ref 11.5–15.5)
WBC: 6.1 10*3/uL (ref 4.0–10.5)

## 2017-12-24 LAB — COMPREHENSIVE METABOLIC PANEL
ALBUMIN: 3.4 g/dL — AB (ref 3.5–5.0)
ALK PHOS: 31 U/L — AB (ref 38–126)
ALT: 60 U/L — ABNORMAL HIGH (ref 0–44)
ANION GAP: 8 (ref 5–15)
AST: 59 U/L — ABNORMAL HIGH (ref 15–41)
BUN: 12 mg/dL (ref 6–20)
CALCIUM: 8.8 mg/dL — AB (ref 8.9–10.3)
CHLORIDE: 109 mmol/L (ref 98–111)
CO2: 23 mmol/L (ref 22–32)
Creatinine, Ser: 0.7 mg/dL (ref 0.44–1.00)
GFR calc Af Amer: >60 mL/min (ref >60)
GFR calc non Af Amer: >60 mL/min (ref >60)
GLUCOSE: 98 mg/dL (ref 70–99)
POTASSIUM: 3.9 mmol/L (ref 3.5–5.1)
SODIUM: 140 mmol/L (ref 135–145)
Total Bilirubin: 0.5 mg/dL (ref 0.3–1.2)
Total Protein: 5.9 g/dL — ABNORMAL LOW (ref 6.5–8.1)

## 2017-12-24 LAB — BRAIN NATRIURETIC PEPTIDE: B NATRIURETIC PEPTIDE 5: 263.2 pg/mL — AB (ref 0.0–100.0)

## 2017-12-24 LAB — TROPONIN I: Troponin I: <0.03 ng/mL (ref <0.03)

## 2017-12-24 LAB — ECHOCARDIOGRAM COMPLETE: Weight: 3022.95 oz

## 2017-12-24 MED ORDER — SPIRONOLACTONE 25 MG PO TABS
25.0000 mg | ORAL_TABLET | Freq: Every day | ORAL | Status: DC
  Administered 2017-12-24 – 2017-12-25 (×2): 25 mg via ORAL
  Filled 2017-12-24 (×2): qty 1

## 2017-12-24 MED ORDER — FUROSEMIDE 10 MG/ML IJ SOLN
40.0000 mg | Freq: Two times a day (BID) | INTRAMUSCULAR | Status: AC
  Administered 2017-12-24: 40 mg via INTRAVENOUS
  Filled 2017-12-24: qty 4

## 2017-12-24 MED ORDER — METOPROLOL SUCCINATE ER 25 MG PO TB24
12.5000 mg | ORAL_TABLET | Freq: Every day | ORAL | Status: DC
Start: 2017-12-24 — End: 2017-12-25
  Administered 2017-12-24 – 2017-12-25 (×2): 12.5 mg via ORAL
  Filled 2017-12-24 (×2): qty 1

## 2017-12-24 MED ORDER — FUROSEMIDE 40 MG PO TABS
40.0000 mg | ORAL_TABLET | Freq: Every day | ORAL | Status: DC
  Administered 2017-12-25: 40 mg via ORAL
  Filled 2017-12-24: qty 1

## 2017-12-24 NOTE — Progress Notes (Signed)
  Echocardiogram 2D Echocardiogram has been performed.  Janalyn Harder 12/24/2017, 1:34 PM

## 2017-12-24 NOTE — Consult Note (Addendum)
Reason for Consult: Cardiomyopathy Referring Physician: [facility]  Tina Colon is an 37 y.o. female.  HPI:   36 y/o female with h/o alcohol abuse, former smoker, presented to the hospital on 12/23/2017 with shortness of breath. She works at [employer] which includes normal-looking everyday. She has been experiencing exertional dyspnea for possible amount. In the recent week or two, she has also had orthopnea, and PND. She does endorse sharp chest pain, last occured over two weeks ago.   Workup showed pulmonary congestion on chest x-ray, elevated BNP. Trop -ve. Echocardiogram showed dilated cardio myopathy with global diffuse hypokinesis and EF of 15-20%.  Patient has significant family history of premature CAD with her mother passing of heart attack at age 18. She does have a brother who also has heart failure. Patient is significant history of alcohol abuse. In her 19s, she used to drink a case of beer is every day, in addition to recur. More recently, she is to reduce her alcohol intake but continues to drink 2 beers every day and occasional liquor.  Patient lives with her brother. She separated and does not have kids.  Past Medical History:  Diagnosis Date  . ETOH abuse   . Gastritis   . GERD (gastroesophageal reflux disease)   . Pancreatitis     Past Surgical History:  Procedure Laterality Date  . WISDOM TOOTH EXTRACTION      Family History  Problem Relation Age of Onset  . Stomach cancer Maternal Grandfather   . Diabetes Unknown   . Heart disease Unknown   . Irritable bowel syndrome Unknown     Social History:  reports that she has quit smoking. She has never used smokeless tobacco. She reports that she drinks alcohol. She reports that she has current or past drug history. Drug: Marijuana.  Allergies:  Allergies  Allergen Reactions  . Penicillins Anaphylaxis    Has patient had a PCN reaction causing immediate rash, facial/tongue/throat swelling, SOB or  lightheadedness with hypotension:Yes Has patient had a PCN reaction causing severe rash involving mucus membranes or skin necrosis:unknown Has patient had a PCN reaction that required hospitalization:Yes Has patient had a PCN reaction occurring within the last 10 years:No If all of the above answers are "NO", then may proceed with Cephalosporin use.     Medications: I have reviewed the patient's current medications.  Results for orders placed or performed during the hospital encounter of 12/23/17 (from the past 48 hour(s))  Comprehensive metabolic panel     Status: Abnormal   Collection Time: 12/23/17 12:43 PM  Result Value Ref Range   Sodium 145 135 - 145 mmol/L   Potassium 3.3 (L) 3.5 - 5.1 mmol/L   Chloride 111 98 - 111 mmol/L    Comment: Please note change in reference range.   CO2 23 22 - 32 mmol/L   Glucose, Bld 103 (H) 70 - 99 mg/dL    Comment: Please note change in reference range.   BUN 13 6 - 20 mg/dL    Comment: Please note change in reference range.   Creatinine, Ser 0.69 0.44 - 1.00 mg/dL   Calcium 9.3 8.9 - 10.3 mg/dL   Total Protein 7.1 6.5 - 8.1 g/dL   Albumin 4.0 3.5 - 5.0 g/dL   AST 32 15 - 41 U/L   ALT 32 0 - 44 U/L    Comment: Please note change in reference range.   Alkaline Phosphatase 38 38 - 126 U/L   Total Bilirubin 0.2 (L)  0.3 - 1.2 mg/dL   GFR calc non Af Amer >60 >60 mL/min   GFR calc Af Amer >60 >60 mL/min    Comment: (NOTE) The eGFR has been calculated using the CKD EPI equation. This calculation has not been validated in all clinical situations. eGFR's persistently <60 mL/min signify possible Chronic Kidney Disease.    Anion gap 11 5 - 15    Comment: Performed at [facility], [place] [address] [place], [place] [address]  Lipase, blood     Status: None   Collection Time: 12/23/17 12:43 PM  Result Value Ref Range   Lipase 25 11 - 51 U/L    Comment: Performed at [facility], [place] [address],  [place], [place] [address]  Ethanol     Status: None   Collection Time: 12/23/17 12:43 PM  Result Value Ref Range   Alcohol, Ethyl (B) <10 <10 mg/dL    Comment: (NOTE) Lowest detectable limit for serum alcohol is 10 mg/dL. For medical purposes only. Performed at [facility], [place] [address], [place], [place] [address]   D-dimer, quantitative     Status: Abnormal   Collection Time: 12/23/17 12:43 PM  Result Value Ref Range   D-Dimer, Quant 1.46 (H) 0.00 - 0.50 ug/mL-FEU    Comment: (NOTE) At the manufacturer cut-off of 0.50 ug/mL FEU, this assay has been documented to exclude PE with a sensitivity and negative predictive value of 97 to 99%.  At this time, this assay has not been approved by the FDA to exclude DVT/VTE. Results should be correlated with clinical presentation. Performed at [facility], [place] [address], [place], [place] [address]   CBC with Differential     Status: None   Collection Time: 12/23/17 12:43 PM  Result Value Ref Range   WBC 7.3 4.0 - 10.5 K/uL   RBC 4.38 3.87 - 5.11 MIL/uL   Hemoglobin 13.1 12.0 - 15.0 g/dL   HCT 39.1 36.0 - 46.0 %   MCV 89.3 78.0 - 100.0 fL   MCH 29.9 26.0 - 34.0 pg   MCHC 33.5 30.0 - 36.0 g/dL   RDW 13.4 11.5 - 15.5 %   Platelets 239 150 - 400 K/uL   Neutrophils Relative % 69 %   Neutro Abs 5.1 1.7 - 7.7 K/uL   Lymphocytes Relative 24 %   Lymphs Abs 1.7 0.7 - 4.0 K/uL   Monocytes Relative 5 %   Monocytes Absolute 0.4 0.1 - 1.0 K/uL   Eosinophils Relative 1 %   Eosinophils Absolute 0.1 0.0 - 0.7 K/uL   Basophils Relative 1 %   Basophils Absolute 0.1 0.0 - 0.1 K/uL    Comment: Performed at [facility], [place] [address], [place], [place] [address]  Brain natriuretic peptide     Status: Abnormal   Collection Time: 12/23/17 12:43 PM  Result Value Ref Range   B Natriuretic Peptide 411.2 (H) 0.0 - 100.0 pg/mL    Comment: Performed at [facility], [place] [address], [place], [place] [address]  TSH     Status: None   Collection Time: 12/23/17  7:34 PM  Result Value Ref Range   TSH 3.184 0.350 - 4.500 uIU/mL    Comment: Performed by a 3rd Generation assay with a functional sensitivity of <=0.01 uIU/mL. Performed at [facility], [place] [address], [place], [place] [address]   Troponin I     Status: None   Collection  Time: 12/23/17 10:03 PM  Result Value Ref Range   Troponin I <0.03 <0.03 ng/mL    Comment: Performed at [facility], [place] [address] [place], [place] [address]  Comprehensive metabolic panel     Status: Abnormal   Collection Time: 12/24/17  4:40 AM  Result Value Ref Range   Sodium 140 135 - 145 mmol/L   Potassium 3.9 3.5 - 5.1 mmol/L   Chloride 109 98 - 111 mmol/L    Comment: Please note change in reference range.   CO2 23 22 - 32 mmol/L   Glucose, Bld 98 70 - 99 mg/dL    Comment: Please note change in reference range.   BUN 12 6 - 20 mg/dL    Comment: Please note change in reference range.   Creatinine, Ser 0.70 0.44 - 1.00 mg/dL   Calcium 8.8 (L) 8.9 - 10.3 mg/dL   Total Protein 5.9 (L) 6.5 - 8.1 g/dL   Albumin 3.4 (L) 3.5 - 5.0 g/dL   AST 59 (H) 15 - 41 U/L   ALT 60 (H) 0 - 44 U/L    Comment: Please note change in reference range.   Alkaline Phosphatase 31 (L) 38 - 126 U/L   Total Bilirubin 0.5 0.3 - 1.2 mg/dL   GFR calc non Af Amer >60 >60 mL/min   GFR calc Af Amer >60 >60 mL/min    Comment: (NOTE) The eGFR has been calculated using the CKD EPI equation. This calculation has not been validated in all clinical situations. eGFR's persistently <60 mL/min signify possible Chronic Kidney Disease.    Anion gap 8 5 - 15    Comment: Performed at [facility], [place] [address], [place], [place] [address]  CBC     Status: Abnormal   Collection Time: 12/24/17  4:40 AM  Result Value Ref Range   WBC 6.1 4.0 - 10.5 K/uL   RBC 3.99 3.87 - 5.11 MIL/uL   Hemoglobin 11.9 (L) 12.0 -  15.0 g/dL   HCT 35.6 (L) 36.0 - 46.0 %   MCV 89.2 78.0 - 100.0 fL   MCH 29.8 26.0 - 34.0 pg   MCHC 33.4 30.0 - 36.0 g/dL   RDW 13.5 11.5 - 15.5 %   Platelets 262 150 - 400 K/uL    Comment: Performed at [facility], [place] [address] [place], [place] [address]  Troponin I     Status: None   Collection Time: 12/24/17  4:40 AM  Result Value Ref Range   Troponin I <0.03 <0.03 ng/mL    Comment: Performed at [facility], [place] [address], [place], [place] [address]  Troponin I     Status: None   Collection Time: 12/24/17 10:17 AM  Result Value Ref Range   Troponin I <0.03 <0.03 ng/mL    Comment: Performed at [facility], [place] [address], [place], [place] [address]  Brain natriuretic peptide     Status: Abnormal   Collection Time: 12/24/17  2:33 PM  Result Value Ref Range   B Natriuretic Peptide 263.2 (H) 0.0 - 100.0 pg/mL    Comment: Performed at [facility], [place] [address] [place], [place] [address]    Dg Chest 2 View  Result Date: 12/23/2017 CLINICAL DATA:  Shortness of breath EXAM: CHEST - 2 VIEW COMPARISON:  November 30, 2017 chest radiograph and chest CT FINDINGS: There is mild cardiomegaly, stable. Pulmonary vascularity is normal. No edema or consolidation. No  adenopathy. No bone lesions. There is slight midthoracic dextroscoliosis. IMPRESSION: No edema or consolidation.  Stable cardiomegaly. Electronically Signed   By: Lowella Grip III M.D.   On: 12/23/2017 10:44   Ct Angio Chest Pe W/cm &/or Wo Cm  Result Date: 12/23/2017 CLINICAL DATA:  Chest pain with shortness of breath. EXAM: CT ANGIOGRAPHY CHEST WITH CONTRAST TECHNIQUE: Multidetector CT imaging of the chest was performed using the standard protocol during bolus administration of intravenous contrast. Multiplanar CT image reconstructions and MIPs were obtained to evaluate the vascular anatomy. CONTRAST:  68m ISOVUE-370 IOPAMIDOL (ISOVUE-370) INJECTION  76% COMPARISON:  CTA chest 11/30/2017 FINDINGS: Cardiovascular: --Pulmonary arteries: Contrast injection is sufficient to demonstrate satisfactory opacification of the pulmonary arteries to the segmental level. There is no pulmonary embolus. The main pulmonary artery is within normal limits for size. --Aorta: Limited opacification of the aorta due to bolus timing optimization for the pulmonary arteries. Conventional 3 vessel aortic branching pattern. The aortic course and caliber are normal. There is no aortic atherosclerosis. --Heart: Mild cardiomegaly. No pericardial effusion. Mediastinum/Nodes: No mediastinal, hilar or axillary lymphadenopathy. The visualized thyroid and thoracic esophageal course are unremarkable. Lungs/Pleura: There are medium-sized pleural effusions. Bibasilar atelectasis. No focal consolidation. Mild pulmonary edema. Upper Abdomen: Contrast bolus timing is not optimized for evaluation of the abdominal organs. Within this limitation, the visualized organs of the upper abdomen are normal. Musculoskeletal: No chest wall abnormality. No acute or significant osseous findings. Review of the MIP images confirms the above findings. IMPRESSION: 1. No pulmonary embolus. 2. Medium-sized bilateral pleural effusions with mild pulmonary edema. Mild cardiomegaly. Electronically Signed   By: KUlyses JarredM.D.   On: 12/23/2017 15:53    Cardiac studies: EKG 12/23/2017: Sinus tachyardia. Rightward axis. Borderline left atrial enlargement. Possible old anteroseptal infarct.  Echocardiogram 12/24/2017: Study Conclusions  - Left ventricle: The cavity size was moderately to severely   dilated. Systolic function was severely reduced. The estimated   ejection fraction was 15-20%. Diffuse hypokinesis. Profound   diffuse hypokinesis throughout, with near akinesis of septal and   anterior walls.. - Aortic valve: Transvalvular velocity was within the normal range.   There was no stenosis. There was no  regurgitation. - Mitral valve: There was mild regurgitation. - Left atrium: The atrium was moderately to severely dilated. - Right ventricle: The cavity size was mildly dilated. Wall   thickness was normal. Systolic function was normal. - Tricuspid valve: There was trivial regurgitation. - Pulmonic valve: There was no significant regurgitation. - Pericardium, extracardiac: A trivial pericardial effusion was   identified posterior to the heart.  Impressions:  - Severely reduced ejection fraction of 15-20%. Global severe   hypokinesis with near akinesis of anterior and septal walls.   Severely dilated left atrium, mild MR. No significant TR to   estimate filling pressures.     Review of Systems  Constitutional: Positive for malaise/fatigue.  Respiratory: Positive for shortness of breath. Negative for sputum production.   Cardiovascular: Positive for chest pain (Two weeks ago), orthopnea and leg swelling. Negative for palpitations, claudication and PND.  Gastrointestinal: Negative.   Genitourinary: Negative.   Musculoskeletal: Negative.   Skin: Negative.   Neurological: Negative for dizziness and loss of consciousness.  Endo/Heme/Allergies: Does not bruise/bleed easily.  Psychiatric/Behavioral: Negative.   All other systems reviewed and are negative.  Blood pressure (!) 128/95, pulse 99, temperature 98.5 F (36.9 C), temperature source Oral, resp. rate 15, weight 85.7 kg (188 lb 15 oz), last menstrual period 12/13/2017, SpO2 95 %.  Physical Exam  Nursing note and vitals reviewed. Constitutional: She is oriented to person, place, and time. She appears well-developed and well-nourished.  Moderately obese  HENT:  Head: Normocephalic and atraumatic.  Eyes: Pupils are equal, round, and reactive to light. Conjunctivae are normal.  Neck: Normal range of motion. Neck supple. JVD (Minimal JVD) present.  Cardiovascular: Regular rhythm and intact distal pulses. Bradycardia present.   No murmur heard. Pulses:      Dorsalis pedis pulses are 2+ on the right side, and 2+ on the left side.       Posterior tibial pulses are 2+ on the right side, and 2+ on the left side.   No carotid bruit  GI: Soft. Bowel sounds are normal. There is tenderness. There is no rebound.  Musculoskeletal: She exhibits edema (Trace b/l).  Lymphadenopathy:    She has no cervical adenopathy.  Neurological: She is alert and oriented to person, place, and time. No cranial nerve deficit.  Skin: Skin is warm and dry.  Psychiatric: She has a normal mood and affect.    Assessment & Recommendations:  37 year old Caucasian female with history of alcohol abuse, now with new diagnosis of cardio myopathy  HFrEF: Dilated cardiomyopathy NYHA class III. Etiology most likely related to alcohol abuse. However, coronary artery disease needs to be ruled out given her family history of premature CAD.  Given no ongoing evidence of myocardial ischemia, I will perform ischemia workup outpatient. Coronary CT angiogram couldn be performed after controlling her heart rate in the near future. She is fairly euvolumic at this tim, able to lay flat without orthopnea. Recommend starting spironolactone 25 mg daily, along with metoprolol succinate 12.5 mg daily, lasix 20 mg daily, without K supplement. Will start [facility] outpatient.  If patients tolerated these medications well, could be discharged tomorrow. I will see her on 7/26 at 2 PM for follow up.  I had a lengthy discussion with her regarding diet and lifestyle changes, most important being alcohol abstinence and salt restriction <2g. I also encouraged her to check weight daily.   Manish J Patwardhan 12/24/2017, 4:44 PM   [place], MD [facility] [place] Pager: [phone] Office: [phone] If no answer Cell [phone]

## 2017-12-24 NOTE — Progress Notes (Addendum)
PROGRESS NOTE    DAO MEARNS  ZOX:[id number] DOB: February 17, 1981 DOA: 12/23/2017 PCP: Georgina Quint, MD   Brief Narrative:37 y.o. female, Tina Colon, h/o pancreatitis, h/o etoh abuse, presented w c/o dyspnea.  Pt states sob since at least 6/24. + orthopnea.  No weight gain.  No leg swelling.   In Ed,  CTA chest  IMPRESSION: 1. No pulmonary embolus. 2. Medium-sized bilateral pleural effusions with mild pulmonary edema. Mild cardiomegaly.  Na 145, K 3.3, Bun 13, Creatinine 0.69 Ast 32, Alt 32 Lipase 25   D dimer 1.46  Wbc 7.3, hgb 13.1, Plt 239  BNP 411.2  Pt will be admitted for CHF, unclear etiology.      Assessment & Plan:   Principal Problem:   Bilateral pleural effusion Active Problems:   Tachycardia   Hypokalemia   CHF (congestive heart failure) (HCC)  1]New CHF/CMP due to alcohol abuse. CT chest showed no evidence of PE but medium size bilateral pleural effusion with mild pulmonary edema.  No signs of acute coronary syndrome negative cardiac enzymes, EKG normal sinus rhythm no acute changes.  Patient has history of alcohol abuse and marijuana abuse will obtain urine drug screen.increase lasix 40 bid.  Lipid panel pending hemoglobin A1c pending TSH normal.ECHO -Severely reduced ejection fraction of 15-20%. Global severe   hypokinesis with near akinesis of anterior and septal walls.   Severely dilated left atrium, mild MR. No significant TR to   estimate filling pressures  2]etoh abuse I will place her on ciwa.  3]gerd/gastritis stable     DVT prophylaxis: Lovenox Code Status full Family Communication:none Disposition Plan tbd Consultants:  card Procedures:none Antimicrobials: none Subjective: No acute distress states the breathing better after diuresis.   Objective: Vitals:   12/23/17 1400 12/23/17 2007 12/23/17 2147 12/24/17 0548  BP: (!) 124/97 (!) 139/100 (!) 136/109 116/83  Pulse: 100 (!) 110 (!) 111 98  Resp: (!) 22 (!) 24 (!) 24 (!)  21  Temp:  98.4 F (36.9 C) 98.8 F (37.1 C) 98.1 F (36.7 C)  TempSrc:  Oral Oral Oral  SpO2: 97%  99% 96%  Weight:    85.7 kg (188 lb 15 oz)    Intake/Output Summary (Last 24 hours) at 12/24/2017 1340 Last data filed at 12/24/2017 0700 Gross per 24 hour  Intake -  Output 700 ml  Net -700 ml   Filed Weights   12/24/17 0548  Weight: 85.7 kg (188 lb 15 oz)    Examination:  General exam: Appears calm and comfortable  Respiratory system: Decreased breath sounds bilateral bases.  Auscultation. Respiratory effort normal. Cardiovascular system: S1 & S2 heard, RRR. No JVD, murmurs, rubs, gallops or clicks. No pedal edema. Gastrointestinal system: Abdomen is nondistended, soft and nontender. No organomegaly or masses felt. Normal bowel sounds heard. Central nervous system: Alert and oriented. No focal neurological deficits. Extremities: 1+ edema bilaterally. Skin: No rashes, lesions or ulcers Psychiatry: Judgement and insight appear normal. Mood & affect appropriate.     Data Reviewed: I have personally reviewed following labs and imaging studies  CBC: Recent Labs  Lab 12/23/17 1243 12/24/17 0440  WBC 7.3 6.1  NEUTROABS 5.1  --   HGB 13.1 11.9*  HCT 39.1 35.6*  MCV 89.3 89.2  PLT 239 262   Basic Metabolic Panel: Recent Labs  Lab 12/23/17 1243 12/24/17 0440  NA 145 140  K 3.3* 3.9  CL 111 109  CO2 23 23  GLUCOSE 103* 98  BUN 13 12  CREATININE 0.69 0.70  CALCIUM 9.3 8.8*   GFR: Estimated Creatinine Clearance: 91.8 mL/min (by C-G formula based on SCr of 0.7 mg/dL). Liver Function Tests: Recent Labs  Lab 12/23/17 1243 12/24/17 0440  AST 32 59*  ALT 32 60*  ALKPHOS 38 31*  BILITOT 0.2* 0.5  PROT 7.1 5.9*  ALBUMIN 4.0 3.4*   Recent Labs  Lab 12/23/17 1243  LIPASE 25   No results for input(s): AMMONIA in the last 168 hours. Coagulation Profile: No results for input(s): INR, PROTIME in the last 168 hours. Cardiac Enzymes: Recent Labs  Lab  12/23/17 2203 12/24/17 0440 12/24/17 1017  TROPONINI <0.03 <0.03 <0.03   BNP (last 3 results) No results for input(s): PROBNP in the last 8760 hours. HbA1C: No results for input(s): HGBA1C in the last 72 hours. CBG: No results for input(s): GLUCAP in the last 168 hours. Lipid Profile: No results for input(s): CHOL, HDL, LDLCALC, TRIG, CHOLHDL, LDLDIRECT in the last 72 hours. Thyroid Function Tests: Recent Labs    12/23/17 1934  TSH 3.184   Anemia Panel: No results for input(s): VITAMINB12, FOLATE, FERRITIN, TIBC, IRON, RETICCTPCT in the last 72 hours. Sepsis Labs: No results for input(s): PROCALCITON, LATICACIDVEN in the last 168 hours.  No results found for this or any previous visit (from the past 240 hour(s)).       Radiology Studies: Dg Chest 2 View  Result Date: 12/23/2017 CLINICAL DATA:  Shortness of breath EXAM: CHEST - 2 VIEW COMPARISON:  November 30, 2017 chest radiograph and chest CT FINDINGS: There is mild cardiomegaly, stable. Pulmonary vascularity is normal. No edema or consolidation. No adenopathy. No bone lesions. There is slight midthoracic dextroscoliosis. IMPRESSION: No edema or consolidation.  Stable cardiomegaly. Electronically Signed   By: Bretta Bang III M.D.   On: 12/23/2017 10:44   Ct Angio Chest Pe W/cm &/or Wo Cm  Result Date: 12/23/2017 CLINICAL DATA:  Chest pain with shortness of breath. EXAM: CT ANGIOGRAPHY CHEST WITH CONTRAST TECHNIQUE: Multidetector CT imaging of the chest was performed using the standard protocol during bolus administration of intravenous contrast. Multiplanar CT image reconstructions and MIPs were obtained to evaluate the vascular anatomy. CONTRAST:  32mL ISOVUE-370 IOPAMIDOL (ISOVUE-370) INJECTION 76% COMPARISON:  CTA chest 11/30/2017 FINDINGS: Cardiovascular: --Pulmonary arteries: Contrast injection is sufficient to demonstrate satisfactory opacification of the pulmonary arteries to the segmental level. There is no pulmonary  embolus. The main pulmonary artery is within normal limits for size. --Aorta: Limited opacification of the aorta due to bolus timing optimization for the pulmonary arteries. Conventional 3 vessel aortic branching pattern. The aortic course and caliber are normal. There is no aortic atherosclerosis. --Heart: Mild cardiomegaly. No pericardial effusion. Mediastinum/Nodes: No mediastinal, hilar or axillary lymphadenopathy. The visualized thyroid and thoracic esophageal course are unremarkable. Lungs/Pleura: There are medium-sized pleural effusions. Bibasilar atelectasis. No focal consolidation. Mild pulmonary edema. Upper Abdomen: Contrast bolus timing is not optimized for evaluation of the abdominal organs. Within this limitation, the visualized organs of the upper abdomen are normal. Musculoskeletal: No chest wall abnormality. No acute or significant osseous findings. Review of the MIP images confirms the above findings. IMPRESSION: 1. No pulmonary embolus. 2. Medium-sized bilateral pleural effusions with mild pulmonary edema. Mild cardiomegaly. Electronically Signed   By: Deatra Robinson M.D.   On: 12/23/2017 15:53        Scheduled Meds: . enoxaparin (LOVENOX) injection  40 mg Subcutaneous Q24H  . folic acid  1 mg Oral Daily  . furosemide  40 mg  Intravenous Daily  . LORazepam  0-4 mg Intravenous Q6H   Followed by  . [START ON 12/25/2017] LORazepam  0-4 mg Intravenous Q12H  . losartan  25 mg Oral Daily  . multivitamin with minerals  1 tablet Oral Daily  . norgestimate-ethinyl estradiol  1 tablet Oral Daily  . sodium chloride flush  3 mL Intravenous Q12H  . thiamine  100 mg Oral Daily   Or  . thiamine  100 mg Intravenous Daily   Continuous Infusions: . sodium chloride       LOS: 1 day     Alwyn Ren, MD [facility] If 7PM-7AM, please contact night-coverage [email or web address] Password [email or web address] 12/24/2017, 1:40 PM

## 2017-12-25 LAB — BASIC METABOLIC PANEL
Anion gap: 10 (ref 5–15)
BUN: 16 mg/dL (ref 6–20)
CHLORIDE: 102 mmol/L (ref 98–111)
CO2: 26 mmol/L (ref 22–32)
CREATININE: 0.79 mg/dL (ref 0.44–1.00)
Calcium: 9.2 mg/dL (ref 8.9–10.3)
GFR calc Af Amer: >60 mL/min (ref >60)
GFR calc non Af Amer: >60 mL/min (ref >60)
Glucose, Bld: 104 mg/dL — ABNORMAL HIGH (ref 70–99)
Potassium: 3.6 mmol/L (ref 3.5–5.1)
Sodium: 138 mmol/L (ref 135–145)

## 2017-12-25 LAB — COXSACKIE A VIRUS ANTIBODIES
Coxsackie A16 IgM: NEGATIVE titer
Coxsackie A24 IgG: 1:400 {titer} — ABNORMAL HIGH
Coxsackie A24 IgM: NEGATIVE titer
Coxsackie A7 IgG: 1:100 {titer} — ABNORMAL HIGH
Coxsackie A7 IgM: NEGATIVE titer
Coxsackie A9 IgM: NEGATIVE titer

## 2017-12-25 LAB — LIPID PANEL
CHOL/HDL RATIO: 3 ratio
Cholesterol: 142 mg/dL (ref 0–200)
HDL: 48 mg/dL (ref >40)
LDL Cholesterol: 70 mg/dL (ref 0–99)
TRIGLYCERIDES: 122 mg/dL (ref <150)
VLDL: 24 mg/dL (ref 0–40)

## 2017-12-25 LAB — TSH: TSH: 1.344 u[IU]/mL (ref 0.350–4.500)

## 2017-12-25 MED ORDER — METOPROLOL SUCCINATE ER 25 MG PO TB24: 12.5000 mg | ORAL_TABLET | Freq: Every day | ORAL | 0 refills | Status: DC

## 2017-12-25 MED ORDER — SPIRONOLACTONE 25 MG PO TABS: 25.0000 mg | ORAL_TABLET | Freq: Every day | ORAL | 0 refills | Status: DC

## 2017-12-25 MED ORDER — THIAMINE HCL 100 MG PO TABS: 100.0000 mg | ORAL_TABLET | Freq: Every day | ORAL | Status: DC

## 2017-12-25 MED ORDER — FUROSEMIDE 20 MG PO TABS: 20.0000 mg | ORAL_TABLET | Freq: Every day | ORAL | 0 refills | Status: DC

## 2017-12-25 MED ORDER — FOLIC ACID 1 MG PO TABS: 1.0000 mg | ORAL_TABLET | Freq: Every day | ORAL | Status: DC

## 2017-12-25 NOTE — Progress Notes (Signed)
Chart reviewed. Tachycardia improved without decrease in blood pressure. Continue metoprolol succinate 12.5 mg daily, spironolactone 25 mg daily, lasix PO 20 mg daily without K supplement. Outpatient labs on 7/24 (will arrange) and office visit on 7/26.  Signing off. Please call with any questions.  Elder Negus, MD [facility] [place] Pager: [phone] Office: [phone] If no answer Cell [phone]

## 2017-12-25 NOTE — Discharge Summary (Signed)
Physician Discharge Summary  Tina Colon:[id number] DOB: 06-12-1980 DOA: 12/23/2017  PCP: Georgina Quint, MD  Admit date: 12/23/2017 Discharge date: 12/25/2017  Admitted From: Disposition:  Recommendations for Outpatient Follow-up:  1. Follow up with PCP in 1-2 weeks 2. Please obtain BMP/CBC in one week 3. Follow-up with cardiology Dr. Rosemary Holms 01/01/2018.  Home Health: Equipment/Devices: Discharge Condition: CODE STATUS Diet recommendation:  Brief/Interim Summary:37 y.o.female,Gerd, h/o pancreatitis, h/o etoh abuse, presented w c/o dyspnea. Pt states sob since at least 6/24. + orthopnea. No weight gain. No leg swelling.   In Ed,  CTA chest IMPRESSION: 1. No pulmonary embolus. 2. Medium-sized bilateral pleural effusions with mild pulmonary edema. Mild cardiomegaly.  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  Patient was admitted with new onset shortness of breath dyspnea on exertion with a history of alcohol abuse and other illegal drug use including marijuana.  Patient was admitted to telemetry.  Cardiac enzymes were negative EKG no acute changes.  Echo showed ejection fraction 15 to 20%.  Global severe hypokinesis with near akinesis of the anterior and septal walls, severely dilated left atrium, mild mitral regurgitation.  She was treated with IV diuresis started on beta-blockers and spironolactone.  She remained hemodynamically stable during his hospital stay.  She will be discharged on metoprolol, Lasix, Aldactone.  Patient will follow-up with Dr. Tammy Sours was not as an outpatient next week for further  work-up.  2] EtOH abuse patient was admitted to the CIWA protocol he does not have any withdrawal.  Counseled about stopping drinking.  Discharge Instructions  Discharge Instructions    Avoid straining   Complete by:  As directed    Call MD for:  difficulty breathing, headache or visual disturbances   Complete by:  As directed    Call MD for:  difficulty breathing, headache or visual disturbances   Complete by:  As directed    Call MD for:  persistant dizziness or light-headedness   Complete by:  As directed    Call MD for:  persistant dizziness or light-headedness   Complete by:  As directed    Call MD for:  persistant nausea and vomiting   Complete by:  As directed    Call MD for:  persistant nausea and vomiting   Complete by:  As directed    Call MD for:  redness, tenderness, or signs of infection (pain, swelling, redness, odor or green/yellow discharge around incision site)   Complete by:  As directed    Call MD for:  severe uncontrolled pain   Complete by:  As directed    Call MD for:  severe uncontrolled pain   Complete by:  As directed    Call MD for:  temperature >100.4   Complete by:  As directed    Diet - low sodium heart healthy   Complete by:  As directed    Diet - low sodium heart healthy   Complete by:  As directed    Heart Failure patients record your daily weight using the same scale at the same time of day   Complete by:  As directed    Increase activity slowly   Complete by:  As directed    Increase activity slowly   Complete by:  As directed    STOP any activity that causes chest pain, shortness of breath, dizziness, sweating, or exessive weakness   Complete by:  As directed      Allergies as of 12/25/2017      Reactions   Penicillins Anaphylaxis   Has patient had a PCN reaction causing immediate rash, facial/tongue/throat swelling, SOB or lightheadedness with hypotension:Yes Has patient had a PCN reaction causing severe rash involving mucus membranes  or skin necrosis:unknown Has patient had a PCN reaction that required hospitalization:Yes Has patient had a PCN reaction occurring within the last 10 years:No If all of the above answers are "NO", then may proceed with Cephalosporin use.      Medication List    TAKE these medications   acetaminophen 325 MG tablet Commonly known as:  TYLENOL Take 650 mg by mouth every 6 (six) hours as needed for moderate pain.   folic acid 1 MG tablet Commonly known as:  FOLVITE Take 1 tablet (1 mg total) by mouth daily. Start taking on:  12/26/2017   furosemide 20 MG tablet Commonly known as:  LASIX Take 1 tablet (20 mg total) by mouth daily.   metoprolol succinate 25 MG 24 hr tablet Commonly known as:  TOPROL-XL Take 0.5 tablets (12.5 mg total) by mouth daily. Start taking on:  12/26/2017   norgestimate-ethinyl estradiol 0.25-35 MG-MCG tablet Commonly known as:  ORTHO-CYCLEN,SPRINTEC,PREVIFEM Take 1 tablet by mouth daily.   spironolactone 25 MG tablet Commonly known as:  ALDACTONE Take 1 tablet (25 mg total) by mouth daily. Start taking on:  12/26/2017   thiamine 100 MG tablet Take 1 tablet (100 mg total) by mouth daily. Start taking on:  12/26/2017      Follow-up Information    Schedule an appointment as soon as possible for a visit  with Georgina Quint, MD.   Specialty:  Internal Medicine Contact information: [address] [place] [address] [phone]        Elder Negus, MD Follow up on 01/01/2018.   Specialty:  Cardiology Why:  2 PM Contact information: [address] [place] [address] [phone]          Allergies  Allergen Reactions  . Penicillins Anaphylaxis    Has patient had a PCN reaction causing immediate rash, facial/tongue/throat swelling, SOB or lightheadedness with hypotension:Yes Has patient had a PCN reaction causing severe rash involving mucus membranes or skin necrosis:unknown Has patient had a PCN reaction that required  hospitalization:Yes Has patient had a PCN reaction occurring within the last 10 years:No If all of the above answers are "NO", then may proceed with Cephalosporin use.     Consultations:  [facility] cardiology   Procedures/Studies: Dg Chest 2 View  Result Date: 12/23/2017 CLINICAL DATA:  Shortness of breath EXAM: CHEST - 2 VIEW COMPARISON:  November 30, 2017 chest radiograph and chest CT FINDINGS: There is mild cardiomegaly, stable. Pulmonary vascularity is normal. No edema or consolidation. No adenopathy. No bone lesions. There is slight midthoracic dextroscoliosis. IMPRESSION: No edema or consolidation.  Stable cardiomegaly. Electronically Signed   By: Bretta Bang III M.D.   On: 12/23/2017 10:44   Dg Chest 2 View  Result Date: 11/30/2017 CLINICAL DATA:  Intermittent chest pain and shortness of breath for the past 2 weeks. EXAM: CHEST - 2 VIEW COMPARISON:  None. FINDINGS: Enlarged,  globular appearance of the cardiopericardial silhouette. Normal pulmonary vascularity. No focal consolidation, pleural effusion, or pneumothorax. No acute osseous abnormality. IMPRESSION: 1. Enlarged, globular appearance of the cardiopericardial silhouette could reflect pericardial effusion versus cardiomegaly. Electronically Signed   By: Obie Dredge M.D.   On: 11/30/2017 15:43   Ct Angio Chest Pe W/cm &/or Wo Cm  Result Date: 12/23/2017 CLINICAL DATA:  Chest pain with shortness of breath. EXAM: CT ANGIOGRAPHY CHEST WITH CONTRAST TECHNIQUE: Multidetector CT imaging of the chest was performed using the standard protocol during bolus administration of intravenous contrast. Multiplanar CT image reconstructions and MIPs were obtained to evaluate the vascular anatomy. CONTRAST:  80mL ISOVUE-370 IOPAMIDOL (ISOVUE-370) INJECTION 76% COMPARISON:  CTA chest 11/30/2017 FINDINGS: Cardiovascular: --Pulmonary arteries: Contrast injection is sufficient to demonstrate satisfactory opacification of the pulmonary arteries to the  segmental level. There is no pulmonary embolus. The main pulmonary artery is within normal limits for size. --Aorta: Limited opacification of the aorta due to bolus timing optimization for the pulmonary arteries. Conventional 3 vessel aortic branching pattern. The aortic course and caliber are normal. There is no aortic atherosclerosis. --Heart: Mild cardiomegaly. No pericardial effusion. Mediastinum/Nodes: No mediastinal, hilar or axillary lymphadenopathy. The visualized thyroid and thoracic esophageal course are unremarkable. Lungs/Pleura: There are medium-sized pleural effusions. Bibasilar atelectasis. No focal consolidation. Mild pulmonary edema. Upper Abdomen: Contrast bolus timing is not optimized for evaluation of the abdominal organs. Within this limitation, the visualized organs of the upper abdomen are normal. Musculoskeletal: No chest wall abnormality. No acute or significant osseous findings. Review of the MIP images confirms the above findings. IMPRESSION: 1. No pulmonary embolus. 2. Medium-sized bilateral pleural effusions with mild pulmonary edema. Mild cardiomegaly. Electronically Signed   By: Deatra Robinson M.D.   On: 12/23/2017 15:53   Ct Angio Chest Pe W And/or Wo Contrast  Result Date: 11/30/2017 CLINICAL DATA:  Intermittent chest pain and shortness of breath for approximately 2 weeks. EXAM: CT ANGIOGRAPHY CHEST WITH CONTRAST TECHNIQUE: Multidetector CT imaging of the chest was performed using the standard protocol during bolus administration of intravenous contrast. Multiplanar CT image reconstructions and MIPs were obtained to evaluate the vascular anatomy. CONTRAST:  100 cc ISOVUE-370 IOPAMIDOL (ISOVUE-370) INJECTION 76% COMPARISON:  Chest x-ray from earlier same day. FINDINGS: Cardiovascular: Mild cardiomegaly. No pericardial effusion. Thoracic aorta is normal in caliber and configuration. No aortic dissection. Some of the most segmental and subsegmental pulmonary artery branches are  difficult to definitively characterize due to patient breathing motion artifact, however, there is no pulmonary embolism identified within the main, lobar or central segmental pulmonary arteries bilaterally. Mediastinum/Nodes: No mass or enlarged lymph nodes within the mediastinum or perihilar regions. Esophagus appears normal. Trachea and central bronchi are unremarkable. Lungs/Pleura: Lungs are clear.  No pleural effusion or pneumothorax. Upper Abdomen: Limited images of the upper abdomen are unremarkable. Musculoskeletal: No acute or significant osseous finding. Mild degenerative spurring within the lower thoracic spine. Review of the MIP images confirms the above findings. IMPRESSION: 1. Mild cardiomegaly.  No pericardial effusion. 2. No acute findings. No pulmonary embolism seen, with mild study limitations detailed above. No pneumonia or pulmonary edema. No thoracic aortic aneurysm or dissection. Electronically Signed   By: Bary Richard M.D.   On: 11/30/2017 20:27    (Echo, Carotid, EGD, Colonoscopy, ERCP)    Subjective:   Discharge Exam: Vitals:   12/25/17 0609 12/25/17 1025  BP: 98/62 119/89  Pulse: 90 (!) 102  Resp: 18   Temp: 98.5 F (36.9 C)  SpO2: 97%    Vitals:   12/24/17 1432 12/24/17 2116 12/25/17 0609 12/25/17 1025  BP: (!) 128/95 123/85 98/62 119/89  Pulse: 99 (!) 102 90 (!) 102  Resp: 15 18 18    Temp: 98.5 F (36.9 C) 98.5 F (36.9 C) 98.5 F (36.9 C)   TempSrc: Oral Oral Oral   SpO2: 95% 97% 97%   Weight:   81.6 kg (179 lb 14.4 oz)   Height:        General: Pt is alert, awake, not in acute distress Cardiovascular: RRR, S1/S2 +, no rubs, no gallops Respiratory: CTA bilaterally, no wheezing, no rhonchi Abdominal: Soft, NT, ND, bowel sounds + Extremities: no edema, no cyanosis    The results of significant diagnostics from this hospitalization (including imaging, microbiology, ancillary and laboratory) are listed below for reference.      Microbiology: No results found for this or any previous visit (from the past 240 hour(s)).   Labs: BNP (last 3 results) Recent Labs    11/30/17 1622 12/23/17 1243 12/24/17 1433  BNP 281.0* 411.2* 263.2*   Basic Metabolic Panel: Recent Labs  Lab 12/23/17 1243 12/24/17 0440 12/25/17 0441  NA 145 140 138  K 3.3* 3.9 3.6  CL 111 109 102  CO2 23 23 26   GLUCOSE 103* 98 104*  BUN 13 12 16   CREATININE 0.69 0.70 0.79  CALCIUM 9.3 8.8* 9.2   Liver Function Tests: Recent Labs  Lab 12/23/17 1243 12/24/17 0440  AST 32 59*  ALT 32 60*  ALKPHOS 38 31*  BILITOT 0.2* 0.5  PROT 7.1 5.9*  ALBUMIN 4.0 3.4*   Recent Labs  Lab 12/23/17 1243  LIPASE 25   No results for input(s): AMMONIA in the last 168 hours. CBC: Recent Labs  Lab 12/23/17 1243 12/24/17 0440  WBC 7.3 6.1  NEUTROABS 5.1  --   HGB 13.1 11.9*  HCT 39.1 35.6*  MCV 89.3 89.2  PLT 239 262   Cardiac Enzymes: Recent Labs  Lab 12/23/17 2203 12/24/17 0440 12/24/17 1017  TROPONINI <0.03 <0.03 <0.03   BNP: Invalid input(s): POCBNP CBG: No results for input(s): GLUCAP in the last 168 hours. D-Dimer Recent Labs    12/23/17 1243  DDIMER 1.46*   Hgb A1c No results for input(s): HGBA1C in the last 72 hours. Lipid Profile Recent Labs    12/25/17 0441  CHOL 142  HDL 48  LDLCALC 70  TRIG 122  CHOLHDL 3.0   Thyroid function studies Recent Labs    12/25/17 0441  TSH 1.344   Anemia work up No results for input(s): VITAMINB12, FOLATE, FERRITIN, TIBC, IRON, RETICCTPCT in the last 72 hours. Urinalysis    Component Value Date/Time   COLORURINE YELLOW 05/30/2015 1702   APPEARANCEUR CLEAR 05/30/2015 1702   LABSPEC 1.013 05/30/2015 1702   PHURINE 7.5 05/30/2015 1702   GLUCOSEU NEGATIVE 05/30/2015 1702   HGBUR LARGE (A) 05/30/2015 1702   BILIRUBINUR NEGATIVE 05/30/2015 1702   BILIRUBINUR neg 11/29/2013 1015   KETONESUR 40 (A) 05/30/2015 1702   PROTEINUR NEGATIVE 05/30/2015 1702    UROBILINOGEN 0.2 11/05/2014 1335   NITRITE NEGATIVE 05/30/2015 1702   LEUKOCYTESUR NEGATIVE 05/30/2015 1702   Sepsis Labs Invalid input(s): PROCALCITONIN,  WBC,  LACTICIDVEN Microbiology No results found for this or any previous visit (from the past 240 hour(s)).   Time coordinating discharge: 35 minutes  SIGNED:   Alwyn Ren, MD  [facility] 12/25/2017, 1:16 PM Pager   If 7PM-7AM, please contact night-coverage [email or web address] Password  [email or web address]

## 2017-12-26 LAB — COXSACKIE B VIRUS ANTIBODIES
COXSACKIE B3 AB: NEGATIVE
Coxsackie B1 Ab: NEGATIVE
Coxsackie B2 Ab: NEGATIVE
Coxsackie B4 Ab: NEGATIVE

## 2018-01-04 ENCOUNTER — Telehealth: Payer: Self-pay | Admitting: Emergency Medicine

## 2018-01-04 NOTE — Telephone Encounter (Signed)
Copied from CRM [id number]. Topic: General - Other >> Jan 04, 2018 11:40 AM Lenoria Chime wrote: Reason for CRM: Pt called stating she was seen at the hospital and was diagnosed with congestive heart failure. She is following up with a cardiologist. She just wanted to forward this information to her PCP.

## 2018-01-05 NOTE — Telephone Encounter (Signed)
Got it, thanks!

## 2018-03-03 ENCOUNTER — Other Ambulatory Visit (HOSPITAL_COMMUNITY): Payer: Self-pay | Admitting: Cardiology

## 2018-03-03 DIAGNOSIS — I429 Cardiomyopathy, unspecified: Secondary | ICD-10-CM

## 2018-03-23 ENCOUNTER — Ambulatory Visit (HOSPITAL_COMMUNITY)
Admission: RE | Admit: 2018-03-23 | Discharge: 2018-03-23 | Disposition: A | Discharge status: Home / Self Care | Payer: BLUE CROSS/BLUE SHIELD | Source: Ambulatory Visit | Attending: Cardiology | Admitting: Cardiology

## 2018-03-23 ENCOUNTER — Encounter: Payer: BLUE CROSS/BLUE SHIELD | Admitting: *Deleted

## 2018-03-23 ENCOUNTER — Encounter: Payer: Self-pay | Admitting: *Deleted

## 2018-03-23 DIAGNOSIS — I429 Cardiomyopathy, unspecified: Secondary | ICD-10-CM

## 2018-03-23 DIAGNOSIS — Z006 Encounter for examination for normal comparison and control in clinical research program: Secondary | ICD-10-CM

## 2018-03-23 DIAGNOSIS — I420 Dilated cardiomyopathy: Secondary | ICD-10-CM

## 2018-03-23 MED ORDER — NITROGLYCERIN 0.4 MG SL SUBL
SUBLINGUAL_TABLET | SUBLINGUAL | Status: AC
  Administered 2018-03-23: 0.4 mg
  Filled 2018-03-23: qty 1

## 2018-03-23 MED ORDER — IOPAMIDOL (ISOVUE-370) INJECTION 76%
100.0000 mL | Freq: Once | INTRAVENOUS | Status: AC | PRN
  Administered 2018-03-23: 100 mL via INTRAVENOUS

## 2018-03-23 NOTE — Research (Signed)
CADFEM Informed Consent           Subject Name:  Tina Colon   Subject met inclusion and exclusion criteria.  The informed consent form, study requirements and expectations were reviewed with the subject and questions and concerns were addressed prior to the signing of the consent form.  The subject verbalized understanding of the trial requirements.  The subject agreed to participate in the CADFEM trial and signed the informed consent.  The informed consent was obtained prior to performance of any protocol-specific procedures for the subject.  A copy of the signed informed consent was given to the subject and a copy was placed in the subject's medical record.   [place] Chalmers, Research Assistant 03/23/2018   09:32 a.m.

## 2018-07-01 ENCOUNTER — Other Ambulatory Visit: Payer: Self-pay | Admitting: *Deleted

## 2018-07-01 DIAGNOSIS — I429 Cardiomyopathy, unspecified: Secondary | ICD-10-CM

## 2018-07-01 NOTE — Progress Notes (Signed)
Paper order received for patient to have cardiac mri   order placed in [facility]

## 2018-07-08 ENCOUNTER — Encounter: Payer: Self-pay | Admitting: *Deleted

## 2018-07-12 ENCOUNTER — Other Ambulatory Visit (HOSPITAL_COMMUNITY): Payer: Self-pay | Admitting: Cardiology

## 2018-07-13 LAB — BASIC METABOLIC PANEL
BUN/Creatinine Ratio: 17 (ref 9–23)
BUN: 15 mg/dL (ref 6–20)
CALCIUM: 9.4 mg/dL (ref 8.7–10.2)
CHLORIDE: 104 mmol/L (ref 96–106)
CO2: 19 mmol/L — AB (ref 20–29)
Creatinine, Ser: 0.88 mg/dL (ref 0.57–1.00)
GFR calc Af Amer: 96 mL/min/{1.73_m2} (ref >59)
GFR, EST NON AFRICAN AMERICAN: 83 mL/min/{1.73_m2} (ref >59)
Glucose: 103 mg/dL — ABNORMAL HIGH (ref 65–99)
POTASSIUM: 3.7 mmol/L (ref 3.5–5.2)
Sodium: 140 mmol/L (ref 134–144)

## 2018-07-16 ENCOUNTER — Ambulatory Visit: Payer: BLUE CROSS/BLUE SHIELD

## 2018-07-20 ENCOUNTER — Ambulatory Visit (INDEPENDENT_AMBULATORY_CARE_PROVIDER_SITE_OTHER): Payer: BLUE CROSS/BLUE SHIELD

## 2018-07-20 VITALS — BP 109/68 | HR 60 | Ht 60.0 in

## 2018-07-20 DIAGNOSIS — I10 Essential (primary) hypertension: Secondary | ICD-10-CM

## 2018-07-20 DIAGNOSIS — I5032 Chronic diastolic (congestive) heart failure: Secondary | ICD-10-CM

## 2018-07-20 NOTE — Progress Notes (Signed)
Pt came in for BP check and meds were reviewed.Pt stated her MRI was on 07/23/18.

## 2018-07-21 ENCOUNTER — Telehealth (HOSPITAL_COMMUNITY): Payer: Self-pay | Admitting: Emergency Medicine

## 2018-07-21 NOTE — Telephone Encounter (Signed)
Reaching out to patient to offer assistance regarding upcoming cardiac imaging study; pt verbalizes understanding of appt date/time, parking situation and where to check in, and verified current allergies; name and call back number provided for further questions should they arise Tykesha Konicki RN Navigator Cardiac Imaging [facility] [phone] office [phone] cell 

## 2018-07-23 ENCOUNTER — Ambulatory Visit (HOSPITAL_COMMUNITY)
Admission: RE | Admit: 2018-07-23 | Discharge: 2018-07-23 | Disposition: A | Discharge status: Home / Self Care | Payer: BLUE CROSS/BLUE SHIELD | Source: Ambulatory Visit | Attending: Cardiology | Admitting: Cardiology

## 2018-07-23 DIAGNOSIS — I429 Cardiomyopathy, unspecified: Secondary | ICD-10-CM

## 2018-07-23 MED ORDER — GADOBUTROL 1 MMOL/ML IV SOLN
8.0000 mL | Freq: Once | INTRAVENOUS | Status: AC | PRN
  Administered 2018-07-23: 8 mL via INTRAVENOUS

## 2018-08-11 NOTE — Progress Notes (Signed)
Patient is here for follow up visit.  Subjective:   @Patient  ID: Tina Colon, female    DOB: 1980/09/07, 38 y.o.   MRN: [id number]  Chief Complaint  Patient presents with  . Cardiomyopathy    F/U     HPI   38 year old Caucasian female with nonischemic dilated cardiomyopathy.  She recently underwent cardiac MRI that showed EF of 50%, much improved from form her previous echocardiogram findings in 05/2018.  Patient is tearful with joy hearing the good news.    Past Medical History:  Diagnosis Date  . ETOH abuse   . Gastritis   . GERD (gastroesophageal reflux disease)   . Pancreatitis     Past Surgical History:  Procedure Laterality Date  . WISDOM TOOTH EXTRACTION      Social History   Socioeconomic History  . Marital status: Married    Spouse name: Not on file  . Number of children: Not on file  . Years of education: Not on file  . Highest education level: Not on file  Occupational History  . Occupation: Lexicographer  . Financial resource strain: Not on file  . Food insecurity:    Worry: Not on file    Inability: Not on file  . Transportation needs:    Medical: Not on file    Non-medical: Not on file  Tobacco Use  . Smoking status: Former Research scientist (life sciences)  . Smokeless tobacco: Never Used  Substance and Sexual Activity  . Alcohol use: Yes    Comment: daily use- wine and beer  . Drug use: Yes    Types: Marijuana    Comment: occasionally  . Sexual activity: Not on file  Lifestyle  . Physical activity:    Days per week: Not on file    Minutes per session: Not on file  . Stress: Not on file  Relationships  . Social connections:    Talks on phone: Not on file    Gets together: Not on file    Attends religious service: Not on file    Active member of club or organization: Not on file    Attends meetings of clubs or organizations: Not on file    Relationship status: Not on file  . Intimate partner violence:    Fear of current or ex partner: Not  on file    Emotionally abused: Not on file    Physically abused: Not on file    Forced sexual activity: Not on file  Other Topics Concern  . Not on file  Social History Narrative  . Not on file    Current Outpatient Medications on File Prior to Visit  Medication Sig Dispense Refill  . acetaminophen (TYLENOL) 325 MG tablet Take 650 mg by mouth every 6 (six) hours as needed for moderate pain.    Steffanie Dunn 5 MG TABS tablet Take 1 tablet by mouth 2 (two) times a week.    Marland Kitchen ENTRESTO 49-51 MG Take 1 tablet by mouth 2 (two) times daily.    Marland Kitchen ESTARYLLA 0.25-35 MG-MCG tablet Take 1 tablet by mouth daily.    . folic acid (FOLVITE) 1 MG tablet Take 1 tablet (1 mg total) by mouth daily.    . furosemide (LASIX) 20 MG tablet Take 1 tablet (20 mg total) by mouth daily. 30 tablet 0  . metoprolol succinate (TOPROL-XL) 25 MG 24 hr tablet Take 0.5 tablets (12.5 mg total) by mouth daily. 30 tablet 0  . norgestimate-ethinyl estradiol (ORTHO-CYCLEN,SPRINTEC,PREVIFEM) 0.25-35  MG-MCG tablet Take 1 tablet by mouth daily.    Marland Kitchen spironolactone (ALDACTONE) 25 MG tablet Take 1 tablet (25 mg total) by mouth daily. 30 tablet 0  . vitamin E (VITAMIN E) 400 UNIT capsule Take 400 Units by mouth daily.     No current facility-administered medications on file prior to visit.     Cardiovascular studies:   Cardiac MRI 07/23/2018: 1.  Mild left ventricular enlargement with borderline LVEF 50%. 2. There is no late gadolinium enhancement in the left ventricular myocardium. 3.  Normal right ventricular chamber size and function, RVEF 56%.  Echocardiogram 05/19/2018: 1. Left ventricle cavity is mildly dilated. Severe decrease in global wall motion. Visual EF is 20-25%. 2. Mild (Grade I) mitral regurgitation. 3. Mild tricuspid regurgitation. No evidence of pulmonary Hypertension. Heart failure with reduced ejection fraction, NYHA class II (I50.20)    Hospital echocardiogram 12/24/2017: Study Conclusions: - Left ventricle:  The cavity size was moderately to severely dilated. Systolic function was severely reduced. The estimated ejection fraction was 15-20%. Diffuse hypokinesis. Profound   diffuse hypokinesis throughout, with near akinesis of septal and anterior walls. - Aortic valve: Transvalvular velocity was within the normal range. There was no stenosis. There was no regurgitation. - Mitral valve: There was mild regurgitation. - Left atrium: The atrium was moderately to severely dilated. - Right ventricle: The cavity size was mildly dilated. Wall   thickness was normal. Systolic function was normal. - Tricuspid valve: There was trivial regurgitation. - Pulmonic valve: There was no significant regurgitation. - Pericardium, extracardiac: A trivial pericardial effusion was   identified posterior to the heart.  Impressions: - Severely reduced ejection fraction of 15-20%. Global severe hypokinesis with near akinesis of anterior and septal walls. Severely dilated left atrium, mild MR. No significant TR to estimate filling pressures.  CTA coronary 03/23/2018: Calcium score 0.  No evidence of CAD.  Recent Labs:   Labs 07/01/2018: Glucose 92. BUN/Cr 13/0.84. eGFR normal. Na/K 141/4.3 Vit B 1 133 normal  Labs 12/24/2017: H/H 11.9/35.6. MCV 89. Platelets 262 GLucose 104. BUN/Cr 16/0.79. eGFR normal. Na/K 138/3.6 BNP 263 Chol 142, HDL 48, LDL 70, TG 122 TSH 1.3    Review of Systems  Constitution: Negative for decreased appetite, malaise/fatigue, weight gain and weight loss.  HENT: Negative for congestion.   Eyes: Negative for visual disturbance.  Cardiovascular: Negative for chest pain, dyspnea on exertion, leg swelling, palpitations and syncope.  Respiratory: Negative for shortness of breath.   Endocrine: Negative for cold intolerance.  Hematologic/Lymphatic: Does not bruise/bleed easily.  Skin: Negative for itching and rash.  Musculoskeletal: Negative for myalgias.  Gastrointestinal: Negative for abdominal  pain, nausea and vomiting.  Genitourinary: Negative for dysuria.  Neurological: Negative for dizziness and weakness.  Psychiatric/Behavioral: The patient is not nervous/anxious.   All other systems reviewed and are negative.      Objective:   Vitals:   08/12/18 1154  BP: 119/76  Pulse: 67  SpO2: 99%     Physical Exam  Constitutional: She is oriented to person, place, and time. She appears well-developed and well-nourished. No distress.  HENT:  Head: Normocephalic and atraumatic.  Eyes: Pupils are equal, round, and reactive to light. Conjunctivae are normal.  Neck: No JVD present.  Cardiovascular: Normal rate, regular rhythm and intact distal pulses.  Pulmonary/Chest: Effort normal and breath sounds normal. She has no wheezes. She has no rales.  Abdominal: Soft. Bowel sounds are normal. There is no rebound.  Musculoskeletal:  General: No edema.  Lymphadenopathy:    She has no cervical adenopathy.  Neurological: She is alert and oriented to person, place, and time. No cranial nerve deficit.  Skin: Skin is warm and dry.  Psychiatric: She has a normal mood and affect.  Nursing note and vitals reviewed.       Assessment & Recommendations:   38 year old Caucasian female with nonischemic dilated cardiomyopathy.  1. Chronic systolic heart failure (HCC) Nonischemic cardiomyopathy.  Patient is doing very well on guideline directed medical therapy with remarkable improvement in her LVEF.  Cardiac MRI 07/2018 shows EF of 50%.   She should continue current maintenance therapy with Entresto 49-51 mg bid, spironolactone to 25 mg daily, metoprolol succinate 12.5 mg daily, corlanor 5 mg bid. She is on oral contraceptive therapy. It remains crucial that she avoids pregnancy, given the teratogenic risks associated with the medications.   I will see her back in 6 months with repeat BMP.  Nigel Mormon, MD [facility] Pager: [phone] Office:  [phone] If no answer Cell [phone]

## 2018-08-12 ENCOUNTER — Ambulatory Visit (INDEPENDENT_AMBULATORY_CARE_PROVIDER_SITE_OTHER): Payer: BLUE CROSS/BLUE SHIELD | Admitting: Cardiology

## 2018-08-12 ENCOUNTER — Encounter: Payer: Self-pay | Admitting: Cardiology

## 2018-08-12 VITALS — BP 119/76 | HR 67 | Ht 60.0 in | Wt 186.0 lb

## 2018-08-12 DIAGNOSIS — I5022 Chronic systolic (congestive) heart failure: Secondary | ICD-10-CM

## 2018-08-12 DIAGNOSIS — I5023 Acute on chronic systolic (congestive) heart failure: Secondary | ICD-10-CM | POA: Insufficient documentation

## 2018-09-15 ENCOUNTER — Other Ambulatory Visit: Payer: Self-pay | Admitting: Cardiology

## 2018-10-01 ENCOUNTER — Other Ambulatory Visit: Payer: Self-pay | Admitting: Cardiology

## 2018-10-13 ENCOUNTER — Encounter (HOSPITAL_BASED_OUTPATIENT_CLINIC_OR_DEPARTMENT_OTHER): Payer: Self-pay

## 2018-10-13 ENCOUNTER — Other Ambulatory Visit: Payer: Self-pay

## 2018-10-13 ENCOUNTER — Emergency Department (HOSPITAL_BASED_OUTPATIENT_CLINIC_OR_DEPARTMENT_OTHER)
Admission: EM | Admit: 2018-10-13 | Discharge: 2018-10-14 | Disposition: A | Discharge status: Home / Self Care | Payer: BLUE CROSS/BLUE SHIELD | Attending: Emergency Medicine | Admitting: Emergency Medicine

## 2018-10-13 DIAGNOSIS — I5022 Chronic systolic (congestive) heart failure: Secondary | ICD-10-CM | POA: Insufficient documentation

## 2018-10-13 DIAGNOSIS — R197 Diarrhea, unspecified: Secondary | ICD-10-CM | POA: Diagnosis not present

## 2018-10-13 DIAGNOSIS — R1013 Epigastric pain: Secondary | ICD-10-CM | POA: Diagnosis present

## 2018-10-13 DIAGNOSIS — R112 Nausea with vomiting, unspecified: Secondary | ICD-10-CM | POA: Diagnosis not present

## 2018-10-13 DIAGNOSIS — Z87891 Personal history of nicotine dependence: Secondary | ICD-10-CM | POA: Diagnosis not present

## 2018-10-13 DIAGNOSIS — Z79899 Other long term (current) drug therapy: Secondary | ICD-10-CM | POA: Insufficient documentation

## 2018-10-13 LAB — COMPREHENSIVE METABOLIC PANEL
ALT: 26 U/L (ref 0–44)
AST: 33 U/L (ref 15–41)
Albumin: 4.5 g/dL (ref 3.5–5.0)
Alkaline Phosphatase: 36 U/L — ABNORMAL LOW (ref 38–126)
Anion gap: 13 (ref 5–15)
BUN: 13 mg/dL (ref 6–20)
CO2: 19 mmol/L — ABNORMAL LOW (ref 22–32)
Calcium: 9.4 mg/dL (ref 8.9–10.3)
Chloride: 106 mmol/L (ref 98–111)
Creatinine, Ser: 0.78 mg/dL (ref 0.44–1.00)
GFR calc Af Amer: >60 mL/min (ref >60)
GFR calc non Af Amer: >60 mL/min (ref >60)
Glucose, Bld: 206 mg/dL — ABNORMAL HIGH (ref 70–99)
Potassium: 3.4 mmol/L — ABNORMAL LOW (ref 3.5–5.1)
Sodium: 138 mmol/L (ref 135–145)
Total Bilirubin: 0.2 mg/dL — ABNORMAL LOW (ref 0.3–1.2)
Total Protein: 7.8 g/dL (ref 6.5–8.1)

## 2018-10-13 LAB — URINALYSIS, ROUTINE W REFLEX MICROSCOPIC
Bilirubin Urine: NEGATIVE
Glucose, UA: NEGATIVE mg/dL
Ketones, ur: 40 mg/dL — AB
Leukocytes,Ua: NEGATIVE
Nitrite: NEGATIVE
Protein, ur: >300 mg/dL — AB
Specific Gravity, Urine: >1.03 — ABNORMAL HIGH (ref 1.005–1.030)
pH: 6 (ref 5.0–8.0)

## 2018-10-13 LAB — CBC
HCT: 39.3 % (ref 36.0–46.0)
Hemoglobin: 13.3 g/dL (ref 12.0–15.0)
MCH: 30.4 pg (ref 26.0–34.0)
MCHC: 33.8 g/dL (ref 30.0–36.0)
MCV: 89.9 fL (ref 80.0–100.0)
Platelets: 252 10*3/uL (ref 150–400)
RBC: 4.37 MIL/uL (ref 3.87–5.11)
RDW: 13.2 % (ref 11.5–15.5)
WBC: 13.7 10*3/uL — ABNORMAL HIGH (ref 4.0–10.5)
nRBC: 0 % (ref 0.0–0.2)

## 2018-10-13 LAB — PREGNANCY, URINE: Preg Test, Ur: NEGATIVE

## 2018-10-13 LAB — LIPASE, BLOOD: Lipase: 23 U/L (ref 11–51)

## 2018-10-13 LAB — URINALYSIS, MICROSCOPIC (REFLEX)

## 2018-10-13 MED ORDER — ONDANSETRON HCL 4 MG/2ML IJ SOLN
4.0000 mg | Freq: Once | INTRAMUSCULAR | Status: AC | PRN
  Administered 2018-10-13: 21:00:00 4 mg via INTRAVENOUS
  Filled 2018-10-13: qty 2

## 2018-10-13 MED ORDER — LIDOCAINE VISCOUS HCL 2 % MT SOLN
15.0000 mL | Freq: Once | OROMUCOSAL | Status: AC
  Administered 2018-10-13: 15 mL via ORAL
  Filled 2018-10-13: qty 15

## 2018-10-13 MED ORDER — DIPHENHYDRAMINE HCL 50 MG/ML IJ SOLN
25.0000 mg | Freq: Once | INTRAMUSCULAR | Status: AC
  Administered 2018-10-14: 25 mg via INTRAVENOUS
  Filled 2018-10-13: qty 1

## 2018-10-13 MED ORDER — PROMETHAZINE HCL 25 MG RE SUPP: 25.0000 mg | Freq: Four times a day (QID) | RECTAL | 0 refills | Status: DC | PRN

## 2018-10-13 MED ORDER — SODIUM CHLORIDE 0.9 % IV BOLUS
1000.0000 mL | Freq: Once | INTRAVENOUS | Status: AC
  Administered 2018-10-13: 1000 mL via INTRAVENOUS

## 2018-10-13 MED ORDER — METOCLOPRAMIDE HCL 5 MG/ML IJ SOLN
10.0000 mg | Freq: Once | INTRAMUSCULAR | Status: AC
  Administered 2018-10-14: 10 mg via INTRAVENOUS
  Filled 2018-10-13: qty 2

## 2018-10-13 MED ORDER — PROMETHAZINE HCL 25 MG/ML IJ SOLN
INTRAMUSCULAR | Status: AC
  Filled 2018-10-13: qty 1

## 2018-10-13 MED ORDER — ALUM & MAG HYDROXIDE-SIMETH 200-200-20 MG/5ML PO SUSP
30.0000 mL | Freq: Once | ORAL | Status: AC
  Administered 2018-10-13: 22:00:00 30 mL via ORAL
  Filled 2018-10-13: qty 30

## 2018-10-13 MED ORDER — PROMETHAZINE HCL 25 MG/ML IJ SOLN
25.0000 mg | Freq: Once | INTRAMUSCULAR | Status: AC
  Administered 2018-10-13: 23:00:00 25 mg via INTRAVENOUS

## 2018-10-13 MED ORDER — FENTANYL CITRATE (PF) 100 MCG/2ML IJ SOLN
50.0000 ug | Freq: Once | INTRAMUSCULAR | Status: AC
  Administered 2018-10-13: 21:00:00 50 ug via INTRAVENOUS
  Filled 2018-10-13: qty 2

## 2018-10-13 NOTE — ED Notes (Signed)
Pt requesting going to restroom. Pt ambulated to restroom without assistance. NAD noted. Pt also given water for PO challenge.

## 2018-10-13 NOTE — ED Triage Notes (Addendum)
C/o abd pain, n/v x today-pt moaning/hyperventilating-encouraged to control breathing-pt states she has hx of pain but does not recall dx-chart reads pancreatitis-pt admits to ETOH yesterday

## 2018-10-13 NOTE — Discharge Instructions (Signed)
You were seen in the emergency department for nausea vomiting and abdominal pain.  Your blood work and urinalysis showed that you were dehydrated.  You received some medications which improved her symptoms somewhat.  It will be important that you follow-up with your primary care doctor for further management of this.  Please return if any concerns.

## 2018-10-13 NOTE — ED Provider Notes (Signed)
[facility] EMERGENCY DEPARTMENT Provider Note   CSN: [id number] Arrival date & time: 10/13/18  2009    History   Chief Complaint Chief Complaint  Patient presents with  . Abdominal Pain    HPI Tina Colon is a 38 y.o. female.  She is presenting today with complaints of severe upper abdominal pain nausea and vomiting since today.  She does not know what provokes her symptoms.  She has not seen any blood from above or below.  She is had a little bit of loose stools.  No fevers or chills no chest pain no shortness of breath.  She admits to tobacco and alcohol.  She said she has had these symptoms before and ended up needing endoscopy about a year ago but never got any answer for what is caused her symptoms.  She is tried nothing for it.  She rates her pain a 7 out of 10.  He said the only thing that helped her pain the last time was a GI cocktail.     The history is provided by the patient.  Abdominal Pain  Pain location:  Epigastric Pain quality: stabbing   Pain radiates to:  Does not radiate Pain severity:  Severe Onset quality:  Gradual Timing:  Constant Progression:  Unchanged Chronicity:  Recurrent Context: alcohol use   Context: not recent illness, not recent travel, not sick contacts, not suspicious food intake and not trauma   Relieved by:  None tried Worsened by:  Nothing Ineffective treatments:  None tried Associated symptoms: diarrhea, nausea and vomiting   Associated symptoms: no chest pain, no chills, no cough, no dysuria, no fever, no hematemesis, no hematochezia, no shortness of breath, no sore throat, no vaginal bleeding and no vaginal discharge     Past Medical History:  Diagnosis Date  . Cardiomyopathy (HCC)   . ETOH abuse   . Gastritis   . GERD (gastroesophageal reflux disease)   . Heart failure (HCC)   . Pancreatitis     Patient Active Problem List   Diagnosis Date Noted  . Chronic systolic heart failure (HCC) 08/12/2018  . Bilateral  pleural effusion 12/23/2017  . Tachycardia 12/23/2017  . Hypokalemia 12/23/2017  . CHF (congestive heart failure) (HCC) 12/23/2017  . Routine general medical examination at a health care facility 10/02/2017  . Gastritis 06/07/2015  . Alcohol abuse 06/07/2015  . Cannabis abuse 06/07/2015    Past Surgical History:  Procedure Laterality Date  . WISDOM TOOTH EXTRACTION       OB History   No obstetric history on file.      Home Medications    Prior to Admission medications   Medication Sig Start Date End Date Taking? Authorizing Provider  acetaminophen (TYLENOL) 325 MG tablet Take 650 mg by mouth every 6 (six) hours as needed for moderate pain.    [provider]  CORLANOR 5 MG TABS tablet Take 1 tablet by mouth 2 (two) times a week. 07/02/18   [provider]  ENTRESTO 49-51 MG Take 1 tablet by mouth twice daily 10/01/18   Patwardhan, Anabel Bene, MD  ESTARYLLA 0.25-35 MG-MCG tablet Take 1 tablet by mouth daily. 03/09/18   [provider]  folic acid (FOLVITE) 1 MG tablet Take 1 tablet (1 mg total) by mouth daily. 12/26/17   Alwyn Ren, MD  furosemide (LASIX) 20 MG tablet Take 1 tablet (20 mg total) by mouth daily. 12/25/17   Alwyn Ren, MD  metoprolol succinate (TOPROL-XL) 25  MG 24 hr tablet Take 0.5 tablets (12.5 mg total) by mouth daily. 12/26/17   Alwyn Ren, MD  norgestimate-ethinyl estradiol (ORTHO-CYCLEN,SPRINTEC,PREVIFEM) 0.25-35 MG-MCG tablet Take 1 tablet by mouth daily.    [provider]  spironolactone (ALDACTONE) 25 MG tablet Take 1 tablet (25 mg total) by mouth daily. 12/26/17   Alwyn Ren, MD  thiamine (VITAMIN B-1) 100 MG tablet Take 100 mg by mouth daily.    [provider]  vitamin E (VITAMIN E) 400 UNIT capsule Take 400 Units by mouth daily.    [provider]    Family History Family History  Problem Relation Age of Onset  . Diabetes Mother   . Hyperlipidemia Mother   .  Hypertension Father   . Gout Father   . Stomach cancer Maternal Grandfather   . Diabetes Other   . Heart disease Other   . Irritable bowel syndrome Other     Social History Social History   Tobacco Use  . Smoking status: Former Smoker    Packs/day: 0.50    Years: 10.00    Pack years: 5.00    Types: Cigarettes  . Smokeless tobacco: Never Used  Substance Use Topics  . Alcohol use: Yes    Frequency: Never    Comment: Occasional   . Drug use: Yes    Types: Marijuana    Comment: occasionally     Allergies   Penicillins   Review of Systems Review of Systems  Constitutional: Negative for chills and fever.  HENT: Negative for sore throat.   Eyes: Negative for visual disturbance.  Respiratory: Negative for cough and shortness of breath.   Cardiovascular: Negative for chest pain.  Gastrointestinal: Positive for abdominal pain, diarrhea, nausea and vomiting. Negative for hematemesis and hematochezia.  Genitourinary: Negative for dysuria, vaginal bleeding and vaginal discharge.  Musculoskeletal: Negative for back pain.  Skin: Negative for rash.  Neurological: Negative for headaches.     Physical Exam Updated Vital Signs BP (!) 151/78 (BP Location: Left Arm)   Pulse 68   Temp 98.4 F (36.9 C) (Oral)   Resp (!) 24   SpO2 98%   Physical Exam Vitals signs and nursing note reviewed.  Constitutional:      General: She is not in acute distress.    Appearance: She is well-developed.  HENT:     Head: Normocephalic and atraumatic.  Eyes:     Conjunctiva/sclera: Conjunctivae normal.  Neck:     Musculoskeletal: Neck supple.  Cardiovascular:     Rate and Rhythm: Normal rate and regular rhythm.     Heart sounds: No murmur.  Pulmonary:     Effort: Pulmonary effort is normal. No respiratory distress.     Breath sounds: Normal breath sounds.  Abdominal:     Palpations: Abdomen is soft.     Tenderness: There is abdominal tenderness in the epigastric area. There is no  guarding or rebound.  Musculoskeletal: Normal range of motion.     Right lower leg: No edema.     Left lower leg: No edema.  Skin:    General: Skin is warm and dry.     Capillary Refill: Capillary refill takes less than 2 seconds.  Neurological:     General: No focal deficit present.     Mental Status: She is alert and oriented to person, place, and time.      ED Treatments / Results  Labs (all labs ordered are listed, but only abnormal results are displayed) Labs  Reviewed  URINALYSIS, ROUTINE W REFLEX MICROSCOPIC - Abnormal; Notable for the following components:      Result Value   Specific Gravity, Urine >1.030 (*)    Hgb urine dipstick MODERATE (*)    Ketones, ur 40 (*)    Protein, ur >300 (*)    All other components within normal limits  URINALYSIS, MICROSCOPIC (REFLEX) - Abnormal; Notable for the following components:   Bacteria, UA RARE (*)    All other components within normal limits  PREGNANCY, URINE  LIPASE, BLOOD  COMPREHENSIVE METABOLIC PANEL  CBC    EKG None  Radiology No results found.  Procedures Procedures (including critical care time)  Medications Ordered in ED Medications  sodium chloride 0.9 % bolus 1,000 mL (has no administration in time range)  fentaNYL (SUBLIMAZE) injection 50 mcg (has no administration in time range)  ondansetron (ZOFRAN) injection 4 mg (4 mg Intravenous Given 10/13/18 2106)     Initial Impression / Assessment and Plan / ED Course  I have reviewed the triage vital signs and the nursing notes.  Pertinent labs & imaging results that were available during my care of the patient were reviewed by me and considered in my medical decision making (see chart for details).  Clinical Course as of Oct 13 932  Wed Oct 13, 2018  [id number] 38 year old female here with nausea vomiting diarrhea and upper abdominal pain.  Differential diagnosis includes gastritis, peptic ulcer disease, obstruction, pancreatitis,  AGE.   [MB]  2223 Patient  asked for a GI cocktail as she said that helped her the most the last time around.  She promptly threw that up.  I have ordered her some Phenergan.   [MB]    Clinical Course User Index [MB] Terrilee FilesButler,  C, MD      Patient signed out to Dr Preston FleetingGlick with plan for reeval after medications. Likely will be able to be discharged if symptoms improved  Final Clinical Impressions(s) / ED Diagnoses   Final diagnoses:  Epigastric pain  Nausea vomiting and diarrhea    ED Discharge Orders    None       Terrilee FilesButler,  C, MD 10/14/18 [phone]

## 2018-10-14 ENCOUNTER — Emergency Department (HOSPITAL_BASED_OUTPATIENT_CLINIC_OR_DEPARTMENT_OTHER): Payer: BLUE CROSS/BLUE SHIELD

## 2018-10-14 MED ORDER — METOCLOPRAMIDE HCL 10 MG PO TABS: 10.0000 mg | ORAL_TABLET | Freq: Four times a day (QID) | ORAL | 0 refills | Status: DC | PRN

## 2018-10-14 MED ORDER — IOHEXOL 300 MG/ML  SOLN
100.0000 mL | Freq: Once | INTRAMUSCULAR | Status: AC | PRN
  Administered 2018-10-14: 01:00:00 100 mL via INTRAVENOUS

## 2018-10-14 MED ORDER — PANTOPRAZOLE SODIUM 40 MG PO TBEC: 40.0000 mg | DELAYED_RELEASE_TABLET | Freq: Every day | ORAL | 0 refills | Status: DC

## 2018-10-14 MED ORDER — SODIUM CHLORIDE 0.9 % IV BOLUS
1000.0000 mL | Freq: Once | INTRAVENOUS | Status: AC
  Administered 2018-10-14: 1000 mL via INTRAVENOUS

## 2018-10-14 MED ORDER — HALOPERIDOL LACTATE 5 MG/ML IJ SOLN
5.0000 mg | Freq: Once | INTRAMUSCULAR | Status: AC
  Administered 2018-10-14: 01:00:00 5 mg via INTRAVENOUS

## 2018-10-14 MED ORDER — FAMOTIDINE IN NACL 20-0.9 MG/50ML-% IV SOLN
20.0000 mg | Freq: Once | INTRAVENOUS | Status: AC
  Administered 2018-10-14: 01:00:00 20 mg via INTRAVENOUS

## 2018-10-14 MED ORDER — HALOPERIDOL LACTATE 5 MG/ML IJ SOLN
INTRAMUSCULAR | Status: AC
  Filled 2018-10-14: qty 1

## 2018-10-14 MED ORDER — FAMOTIDINE IN NACL 20-0.9 MG/50ML-% IV SOLN
INTRAVENOUS | Status: AC
  Filled 2018-10-14: qty 50

## 2018-10-14 NOTE — ED Notes (Signed)
Patient transported to CT 

## 2018-10-14 NOTE — ED Notes (Signed)
ED Provider at bedside. 

## 2018-10-14 NOTE — ED Provider Notes (Signed)
Care assumed from Dr. Charm Barges, patient with epigastric pain and nausea and vomiting since noon.  She had similar symptoms several years ago, currently not on any GI medications.  She had received promethazine, but continued to be actively retching.  She was given metoclopramide and stop retching but still continues to complain of epigastric pain and nausea.  Patient was reexamined and has significant epigastric tenderness, no rebound or guarding.  Peristalsis is decreased.  Labs today show elevated WBC and elevated glucose.  Old records were reviewed, and she had several visits for GI distress in 2016-2017 and had GI evaluation including upper endoscopy which was normal.  She will be given IV fluids, haloperidol, famotidine and will send for CT of abdomen and pelvis.  CT shows no acute process.  She feels significantly better after above-noted treatment.  She is discharged with prescription for metoclopramide and pantoprazole, referred to gastroenterology for follow-up.  Results for orders placed or performed during the hospital encounter of 10/13/18  Urinalysis, Routine w reflex microscopic  Result Value Ref Range   Color, Urine YELLOW YELLOW   APPearance CLEAR CLEAR   Specific Gravity, Urine >1.030 (H) 1.005 - 1.030   pH 6.0 5.0 - 8.0   Glucose, UA NEGATIVE NEGATIVE mg/dL   Hgb urine dipstick MODERATE (A) NEGATIVE   Bilirubin Urine NEGATIVE NEGATIVE   Ketones, ur 40 (A) NEGATIVE mg/dL   Protein, ur >812 (A) NEGATIVE mg/dL   Nitrite NEGATIVE NEGATIVE   Leukocytes,Ua NEGATIVE NEGATIVE  Pregnancy, urine  Result Value Ref Range   Preg Test, Ur NEGATIVE NEGATIVE  Urinalysis, Microscopic (reflex)  Result Value Ref Range   RBC / HPF 6-10 0 - 5 RBC/hpf   WBC, UA 0-5 0 - 5 WBC/hpf   Bacteria, UA RARE (A) NONE SEEN   Squamous Epithelial / LPF 0-5 0 - 5   Mucus PRESENT   Lipase, blood  Result Value Ref Range   Lipase 23 11 - 51 U/L  Comprehensive metabolic panel  Result Value Ref Range   Sodium 138 135 - 145 mmol/L   Potassium 3.4 (L) 3.5 - 5.1 mmol/L   Chloride 106 98 - 111 mmol/L   CO2 19 (L) 22 - 32 mmol/L   Glucose, Bld 206 (H) 70 - 99 mg/dL   BUN 13 6 - 20 mg/dL   Creatinine, Ser 7.51 0.44 - 1.00 mg/dL   Calcium 9.4 8.9 - 70.0 mg/dL   Total Protein 7.8 6.5 - 8.1 g/dL   Albumin 4.5 3.5 - 5.0 g/dL   AST 33 15 - 41 U/L   ALT 26 0 - 44 U/L   Alkaline Phosphatase 36 (L) 38 - 126 U/L   Total Bilirubin 0.2 (L) 0.3 - 1.2 mg/dL   GFR calc non Af Amer >60 >60 mL/min   GFR calc Af Amer >60 >60 mL/min   Anion gap 13 5 - 15  CBC  Result Value Ref Range   WBC 13.7 (H) 4.0 - 10.5 K/uL   RBC 4.37 3.87 - 5.11 MIL/uL   Hemoglobin 13.3 12.0 - 15.0 g/dL   HCT 17.4 94.4 - 96.7 %   MCV 89.9 80.0 - 100.0 fL   MCH 30.4 26.0 - 34.0 pg   MCHC 33.8 30.0 - 36.0 g/dL   RDW 59.1 63.8 - 46.6 %   Platelets 252 150 - 400 K/uL   nRBC 0.0 0.0 - 0.2 %   Ct Abdomen Pelvis W Contrast  Result Date: 10/14/2018 CLINICAL DATA:  Abdominal pain, nausea,  vomiting. EXAM: CT ABDOMEN AND PELVIS WITH CONTRAST TECHNIQUE: Multidetector CT imaging of the abdomen and pelvis was performed using the standard protocol following bolus administration of intravenous contrast. CONTRAST:  OMNIPAQUE IOHEXOL 300 MG/ML  SOLN COMPARISON:  05/29/2015 FINDINGS: Lower chest: Lung bases are clear. No effusions. Heart is normal size. Hepatobiliary: Focal fatty infiltration adjacent to the falciform ligament. No suspicious focal hepatic abnormality. Gallbladder unremarkable. Pancreas: No focal abnormality or ductal dilatation. Spleen: No focal abnormality.  Normal size. Adrenals/Urinary Tract: No adrenal abnormality. No focal renal abnormality. No stones or hydronephrosis. Urinary bladder is unremarkable. Stomach/Bowel: Normal appendix. Stomach, large and small bowel grossly unremarkable. Vascular/Lymphatic: No evidence of aneurysm or adenopathy. Reproductive: Uterus and adnexa unremarkable.  No mass. Other: No free fluid or  free air. Musculoskeletal: No acute bony abnormality. Degenerative changes and 9 mm of anterolisthesis of L5 on S1. IMPRESSION: No acute findings in the abdomen or pelvis. Degenerative disc disease and grade 1-2 spondylolisthesis at L5-S1. Electronically Signed   By: Charlett Nose M.D.   On: 10/14/2018 01:28   Images viewed by me.    Dione Booze, MD 10/14/18 [phone]

## 2018-10-14 NOTE — ED Notes (Signed)
Returned from CT.

## 2018-10-14 NOTE — ED Notes (Signed)
Checked on patient, she was sleeping. Asked how she was doing and stated the same as before. RN inquired if patient has gotten any relief from treatment modalities. She stated some. RN informed MD of information.

## 2018-10-14 NOTE — ED Notes (Signed)
Pt started vomiting after attempting to drink water. EDP was notified and orders were received. Will continue to monitor

## 2018-10-19 ENCOUNTER — Telehealth: Payer: Self-pay

## 2018-10-19 NOTE — Telephone Encounter (Signed)
Pt called to let you know that she was seen in the ED for pancreatitis and wants to know if you need to see her. Please review and advise.//ah

## 2018-10-20 NOTE — Telephone Encounter (Signed)
LMOM advising.//ah

## 2018-10-23 ENCOUNTER — Other Ambulatory Visit: Payer: Self-pay | Admitting: Cardiology

## 2018-11-17 ENCOUNTER — Telehealth: Payer: Self-pay

## 2018-11-17 ENCOUNTER — Emergency Department (HOSPITAL_BASED_OUTPATIENT_CLINIC_OR_DEPARTMENT_OTHER)
Admission: EM | Admit: 2018-11-17 | Discharge: 2018-11-17 | Disposition: A | Discharge status: Home / Self Care | Payer: BC Managed Care – PPO | Attending: Emergency Medicine | Admitting: Emergency Medicine

## 2018-11-17 ENCOUNTER — Emergency Department (HOSPITAL_BASED_OUTPATIENT_CLINIC_OR_DEPARTMENT_OTHER): Payer: BC Managed Care – PPO

## 2018-11-17 ENCOUNTER — Encounter (HOSPITAL_BASED_OUTPATIENT_CLINIC_OR_DEPARTMENT_OTHER): Payer: Self-pay

## 2018-11-17 ENCOUNTER — Other Ambulatory Visit: Payer: Self-pay

## 2018-11-17 DIAGNOSIS — R1011 Right upper quadrant pain: Secondary | ICD-10-CM | POA: Diagnosis not present

## 2018-11-17 DIAGNOSIS — F1210 Cannabis abuse, uncomplicated: Secondary | ICD-10-CM | POA: Insufficient documentation

## 2018-11-17 DIAGNOSIS — F1010 Alcohol abuse, uncomplicated: Secondary | ICD-10-CM | POA: Insufficient documentation

## 2018-11-17 DIAGNOSIS — I5022 Chronic systolic (congestive) heart failure: Secondary | ICD-10-CM | POA: Diagnosis not present

## 2018-11-17 DIAGNOSIS — Z87891 Personal history of nicotine dependence: Secondary | ICD-10-CM | POA: Insufficient documentation

## 2018-11-17 DIAGNOSIS — Z79899 Other long term (current) drug therapy: Secondary | ICD-10-CM | POA: Insufficient documentation

## 2018-11-17 DIAGNOSIS — R1013 Epigastric pain: Secondary | ICD-10-CM | POA: Diagnosis present

## 2018-11-17 LAB — LIPASE, BLOOD: Lipase: 25 U/L (ref 11–51)

## 2018-11-17 LAB — URINALYSIS, MICROSCOPIC (REFLEX): RBC / HPF: >50 RBC/hpf (ref 0–5)

## 2018-11-17 LAB — CBC WITH DIFFERENTIAL/PLATELET
Abs Immature Granulocytes: 0.03 10*3/uL (ref 0.00–0.07)
Basophils Absolute: 0.1 10*3/uL (ref 0.0–0.1)
Basophils Relative: 1 %
Eosinophils Absolute: 0.1 10*3/uL (ref 0.0–0.5)
Eosinophils Relative: 1 %
HCT: 38.5 % (ref 36.0–46.0)
Hemoglobin: 13.4 g/dL (ref 12.0–15.0)
Immature Granulocytes: 0 %
Lymphocytes Relative: 15 %
Lymphs Abs: 1.3 10*3/uL (ref 0.7–4.0)
MCH: 31.2 pg (ref 26.0–34.0)
MCHC: 34.8 g/dL (ref 30.0–36.0)
MCV: 89.5 fL (ref 80.0–100.0)
Monocytes Absolute: 0.3 10*3/uL (ref 0.1–1.0)
Monocytes Relative: 4 %
Neutro Abs: 6.7 10*3/uL (ref 1.7–7.7)
Neutrophils Relative %: 79 %
Platelets: 277 10*3/uL (ref 150–400)
RBC: 4.3 MIL/uL (ref 3.87–5.11)
RDW: 12.9 % (ref 11.5–15.5)
WBC: 8.5 10*3/uL (ref 4.0–10.5)
nRBC: 0 % (ref 0.0–0.2)

## 2018-11-17 LAB — TROPONIN I: Troponin I: <0.03 ng/mL (ref <0.03)

## 2018-11-17 LAB — URINALYSIS, ROUTINE W REFLEX MICROSCOPIC
Bilirubin Urine: NEGATIVE
Glucose, UA: NEGATIVE mg/dL
Ketones, ur: 40 mg/dL — AB
Leukocytes,Ua: NEGATIVE
Nitrite: NEGATIVE
Protein, ur: 30 mg/dL — AB
Specific Gravity, Urine: 1.025 (ref 1.005–1.030)
pH: 8.5 — ABNORMAL HIGH (ref 5.0–8.0)

## 2018-11-17 LAB — COMPREHENSIVE METABOLIC PANEL
ALT: 26 U/L (ref 0–44)
AST: 30 U/L (ref 15–41)
Albumin: 4.4 g/dL (ref 3.5–5.0)
Alkaline Phosphatase: 36 U/L — ABNORMAL LOW (ref 38–126)
Anion gap: 15 (ref 5–15)
BUN: 9 mg/dL (ref 6–20)
CO2: 19 mmol/L — ABNORMAL LOW (ref 22–32)
Calcium: 9.2 mg/dL (ref 8.9–10.3)
Chloride: 105 mmol/L (ref 98–111)
Creatinine, Ser: 0.77 mg/dL (ref 0.44–1.00)
GFR calc Af Amer: >60 mL/min (ref >60)
GFR calc non Af Amer: >60 mL/min (ref >60)
Glucose, Bld: 159 mg/dL — ABNORMAL HIGH (ref 70–99)
Potassium: 3 mmol/L — ABNORMAL LOW (ref 3.5–5.1)
Sodium: 139 mmol/L (ref 135–145)
Total Bilirubin: 0.5 mg/dL (ref 0.3–1.2)
Total Protein: 7.4 g/dL (ref 6.5–8.1)

## 2018-11-17 LAB — PREGNANCY, URINE: Preg Test, Ur: NEGATIVE

## 2018-11-17 MED ORDER — POTASSIUM CHLORIDE 10 MEQ/100ML IV SOLN
10.0000 meq | Freq: Once | INTRAVENOUS | Status: AC
  Administered 2018-11-17: 10 meq via INTRAVENOUS
  Filled 2018-11-17: qty 100

## 2018-11-17 MED ORDER — ALUM & MAG HYDROXIDE-SIMETH 200-200-20 MG/5ML PO SUSP
30.0000 mL | Freq: Once | ORAL | Status: AC
  Administered 2018-11-17: 30 mL via ORAL
  Filled 2018-11-17: qty 30

## 2018-11-17 MED ORDER — FENTANYL CITRATE (PF) 100 MCG/2ML IJ SOLN
50.0000 ug | Freq: Once | INTRAMUSCULAR | Status: AC
  Administered 2018-11-17: 50 ug via INTRAVENOUS
  Filled 2018-11-17: qty 2

## 2018-11-17 MED ORDER — ONDANSETRON HCL 4 MG/2ML IJ SOLN
4.0000 mg | Freq: Once | INTRAMUSCULAR | Status: AC
  Administered 2018-11-17: 4 mg via INTRAVENOUS
  Filled 2018-11-17: qty 2

## 2018-11-17 MED ORDER — HALOPERIDOL LACTATE 5 MG/ML IJ SOLN
2.0000 mg | Freq: Once | INTRAMUSCULAR | Status: AC
  Administered 2018-11-17: 2 mg via INTRAVENOUS
  Filled 2018-11-17: qty 1

## 2018-11-17 MED ORDER — SODIUM CHLORIDE 0.9 % IV BOLUS
1000.0000 mL | Freq: Once | INTRAVENOUS | Status: AC
  Administered 2018-11-17: 1000 mL via INTRAVENOUS

## 2018-11-17 MED ORDER — LIDOCAINE VISCOUS HCL 2 % MT SOLN
15.0000 mL | Freq: Once | OROMUCOSAL | Status: AC
  Administered 2018-11-17: 15 mL via ORAL
  Filled 2018-11-17: qty 15

## 2018-11-17 MED ORDER — POTASSIUM CHLORIDE CRYS ER 20 MEQ PO TBCR
40.0000 meq | EXTENDED_RELEASE_TABLET | Freq: Once | ORAL | Status: AC
  Administered 2018-11-17: 40 meq via ORAL
  Filled 2018-11-17: qty 2

## 2018-11-17 NOTE — Telephone Encounter (Signed)
OK to send. Thanks.

## 2018-11-17 NOTE — ED Notes (Signed)
ED Provider at bedside. 

## 2018-11-17 NOTE — ED Notes (Signed)
Pt provided ginger ale at this time for PO challenge. Pt reports pain is beginning to come back.

## 2018-11-17 NOTE — ED Triage Notes (Signed)
Pt c/o abd pain, n/v-states she was seen recently for same-to triage in w/c

## 2018-11-17 NOTE — ED Provider Notes (Signed)
[facility] EMERGENCY DEPARTMENT Provider Note   CSN: [id number] Arrival date & time: 11/17/18  1425    History   Chief Complaint Chief Complaint  Patient presents with  . Abdominal Pain    HPI Tina Colon is a 38 y.o. female with past medical history of cardiomyopathy, GERD, heart failure, pancreatitis, alcohol abuse presents emergency department today with chief complaint of abdominal pain x1 day. She describes the pain as sharp and located in epigastric area radiating to right upper quadrant, pain is 9/10 in severity.. She has history of similar pain. Today she has associated nausea with non bloody emesis. She reports too many episodes of emesis to count. Pt drinks 2-3 beers. She smokes marijuana daily. Reports endoscopy x 1 year ago without findings. She has not seen GI "in awhile." She has not taken anything for pain prior to arrival.  She denies fever, chills, chest pain, shortness of breath, urinary symptoms, diarrhea, suspicious food intake, sick contacts. Also denies abdominal surgical history.   Past Medical History:  Diagnosis Date  . Cardiomyopathy (HCC)   . ETOH abuse   . Gastritis   . GERD (gastroesophageal reflux disease)   . Heart failure (HCC)   . Pancreatitis     Patient Active Problem List   Diagnosis Date Noted  . Chronic systolic heart failure (HCC) 08/12/2018  . Bilateral pleural effusion 12/23/2017  . Tachycardia 12/23/2017  . Hypokalemia 12/23/2017  . CHF (congestive heart failure) (HCC) 12/23/2017  . Routine general medical examination at a health care facility 10/02/2017  . Gastritis 06/07/2015  . Alcohol abuse 06/07/2015  . Cannabis abuse 06/07/2015    Past Surgical History:  Procedure Laterality Date  . WISDOM TOOTH EXTRACTION       OB History   No obstetric history on file.      Home Medications    Prior to Admission medications   Medication Sig Start Date End Date Taking? Authorizing Provider  acetaminophen  (TYLENOL) 325 MG tablet Take 650 mg by mouth every 6 (six) hours as needed for moderate pain.    [provider]  CORLANOR 5 MG TABS tablet Take 1 tablet by mouth twice daily 10/25/18   Patwardhan, Anabel Bene, MD  ENTRESTO 49-51 MG Take 1 tablet by mouth twice daily 10/01/18   Patwardhan, Anabel Bene, MD  ESTARYLLA 0.25-35 MG-MCG tablet Take 1 tablet by mouth daily. 03/09/18   [provider]  folic acid (FOLVITE) 1 MG tablet Take 1 tablet (1 mg total) by mouth daily. 12/26/17   Alwyn Ren, MD  furosemide (LASIX) 20 MG tablet Take 1 tablet (20 mg total) by mouth daily. 12/25/17   Alwyn Ren, MD  metoCLOPramide (REGLAN) 10 MG tablet Take 1 tablet (10 mg total) by mouth every 6 (six) hours as needed for nausea (or headache). 10/14/18   Dione Booze, MD  metoprolol succinate (TOPROL-XL) 25 MG 24 hr tablet Take 0.5 tablets (12.5 mg total) by mouth daily. 12/26/17   Alwyn Ren, MD  norgestimate-ethinyl estradiol (ORTHO-CYCLEN,SPRINTEC,PREVIFEM) 0.25-35 MG-MCG tablet Take 1 tablet by mouth daily.    [provider]  pantoprazole (PROTONIX) 40 MG tablet Take 1 tablet (40 mg total) by mouth daily. 11/18/18   Georgina Quint, MD  promethazine (PHENERGAN) 25 MG suppository Place 1 suppository (25 mg total) rectally every 6 (six) hours as needed for nausea or vomiting. 10/13/18   Terrilee Files, MD  spironolactone (ALDACTONE) 25 MG tablet Take 1 tablet (25 mg total)  by mouth daily. 12/26/17   Alwyn RenMathews, Elizabeth G, MD  thiamine (VITAMIN B-1) 100 MG tablet Take 100 mg by mouth daily.    [provider]  vitamin E (VITAMIN E) 400 UNIT capsule Take 400 Units by mouth daily.    [provider]    Family History Family History  Problem Relation Age of Onset  . Diabetes Mother   . Hyperlipidemia Mother   . Hypertension Father   . Gout Father   . Stomach cancer Maternal Grandfather   . Diabetes Other   . Heart disease Other   . Irritable bowel  syndrome Other     Social History Social History   Tobacco Use  . Smoking status: Former Smoker    Packs/day: 0.50    Years: 10.00    Pack years: 5.00    Types: Cigarettes  . Smokeless tobacco: Never Used  Substance Use Topics  . Alcohol use: Not Currently    Frequency: Never  . Drug use: Yes    Types: Marijuana    Comment: last used 11/16/18     Allergies   Penicillins   Review of Systems Review of Systems  Constitutional: Negative for chills and fever.  HENT: Negative for congestion, ear discharge, ear pain, sinus pressure, sinus pain and sore throat.   Eyes: Negative for pain and redness.  Respiratory: Negative for cough and shortness of breath.   Cardiovascular: Negative for chest pain.  Gastrointestinal: Positive for abdominal pain, nausea and vomiting. Negative for constipation and diarrhea.  Genitourinary: Negative for dysuria and hematuria.  Musculoskeletal: Negative for back pain and neck pain.  Skin: Negative for wound.  Neurological: Negative for weakness, numbness and headaches.     Physical Exam Updated Vital Signs BP 129/85 (BP Location: Right Arm)   Pulse (!) 54   Temp 98.4 F (36.9 C) (Oral)   Resp 16   Ht 5\' 4"  (1.626 m)   Wt 81.6 kg   LMP 11/16/2018   SpO2 100%   BMI 30.90 kg/m   Physical Exam Vitals signs and nursing note reviewed.  Constitutional:      Appearance: She is not ill-appearing.     Comments: Pt moaning loudly and rolling around on stretcher with hands on her abdomen. Pt coughing and attempting to induce vomiting  HENT:     Head: Normocephalic and atraumatic.     Right Ear: Tympanic membrane and external ear normal.     Left Ear: Tympanic membrane and external ear normal.     Nose: Nose normal.     Mouth/Throat:     Mouth: Mucous membranes are dry.     Pharynx: Oropharynx is clear. No posterior oropharyngeal erythema.  Eyes:     General: No scleral icterus.       Right eye: No discharge.        Left eye: No discharge.      Extraocular Movements: Extraocular movements intact.     Conjunctiva/sclera: Conjunctivae normal.     Pupils: Pupils are equal, round, and reactive to light.  Neck:     Musculoskeletal: Normal range of motion.     Vascular: No JVD.  Cardiovascular:     Rate and Rhythm: Normal rate and regular rhythm.     Pulses: Normal pulses.          Radial pulses are 2+ on the right side and 2+ on the left side.     Heart sounds: Normal heart sounds.     Comments: HR 65  during exam Pulmonary:     Comments: Lungs clear to auscultation in all fields. Symmetric chest rise. No wheezing, rales, or rhonchi. Abdominal:     Tenderness: There is no right CVA tenderness or left CVA tenderness. Negative signs include Murphy's sign.     Comments: Abdomen is soft, non-distended. Tenderness to palpation of epigastric region. No rigidity, no guarding. No peritoneal signs.  Musculoskeletal: Normal range of motion.  Skin:    General: Skin is warm and dry.     Capillary Refill: Capillary refill takes less than 2 seconds.  Neurological:     Mental Status: She is oriented to person, place, and time.     GCS: GCS eye subscore is 4. GCS verbal subscore is 5. GCS motor subscore is 6.     Comments: Fluent speech, no facial droop.  Psychiatric:        Behavior: Behavior normal.      ED Treatments / Results  Labs (all labs ordered are listed, but only abnormal results are displayed) Labs Reviewed  URINALYSIS, ROUTINE W REFLEX MICROSCOPIC - Abnormal; Notable for the following components:      Result Value   APPearance HAZY (*)    pH 8.5 (*)    Hgb urine dipstick LARGE (*)    Ketones, ur 40 (*)    Protein, ur 30 (*)    All other components within normal limits  COMPREHENSIVE METABOLIC PANEL - Abnormal; Notable for the following components:   Potassium 3.0 (*)    CO2 19 (*)    Glucose, Bld 159 (*)    Alkaline Phosphatase 36 (*)    All other components within normal limits  URINALYSIS, MICROSCOPIC (REFLEX)  - Abnormal; Notable for the following components:   Bacteria, UA FEW (*)    All other components within normal limits  PREGNANCY, URINE  CBC WITH DIFFERENTIAL/PLATELET  LIPASE, BLOOD  TROPONIN I    EKG EKG Interpretation  Date/Time:  Wednesday November 17 2018 15:16:29 EDT Ventricular Rate:  49 PR Interval:    QRS Duration: 101 QT Interval:  516 QTC Calculation: 466 R Axis:   17 Text Interpretation:  Sinus bradycardia TW inversion V2 and lateral leads appears new from prior, rate has slowed Confirmed by Gareth Morgan [phone]) on 11/17/2018 3:53:35 PM   Radiology US Abdomen Limited Ruq  Result Date: 11/17/2018 CLINICAL DATA:  Right upper quadrant abdominal pain and vomiting for 1 day EXAM: ULTRASOUND ABDOMEN LIMITED RIGHT UPPER QUADRANT COMPARISON:  10/14/2018 CT abdomen/pelvis. FINDINGS: Gallbladder: Questionable minimal layering sludge in the nondistended gallbladder. No convincing gallstones. No gallbladder wall thickening. No pericholecystic fluid. No sonographic Murphy sign. Common bile duct: Diameter: 4 mm Liver: No focal lesion identified. Within normal limits in parenchymal echogenicity. Portal vein is patent on color Doppler imaging with normal direction of blood flow towards the liver. IMPRESSION: 1. Questionable minimal layering sludge in the gallbladder, with no evidence of cholelithiasis or acute cholecystitis. 2. No biliary ductal dilatation. 3. Normal liver. Electronically Signed   By: Ilona Sorrel M.D.   On: 11/17/2018 17:32    Procedures Procedures (including critical care time)  Medications Ordered in ED Medications  ondansetron (ZOFRAN) injection 4 mg (4 mg Intravenous Given 11/17/18 1515)  fentaNYL (SUBLIMAZE) injection 50 mcg (50 mcg Intravenous Given 11/17/18 1516)  sodium chloride 0.9 % bolus 1,000 mL (0 mLs Intravenous Stopped 11/17/18 1633)  haloperidol lactate (HALDOL) injection 2 mg (2 mg Intravenous Given 11/17/18 1544)  potassium chloride 10 mEq in 100 mL  IVPB (  0 mEq Intravenous Stopped 11/17/18 1712)  haloperidol lactate (HALDOL) injection 2 mg (2 mg Intravenous Given 11/17/18 1633)  sodium chloride 0.9 % bolus 1,000 mL (0 mLs Intravenous Stopped 11/17/18 1712)  potassium chloride SA (K-DUR) CR tablet 40 mEq (40 mEq Oral Given 11/17/18 1850)  alum & mag hydroxide-simeth (MAALOX/MYLANTA) 200-200-20 MG/5ML suspension 30 mL (30 mLs Oral Given 11/17/18 1949)    And  lidocaine (XYLOCAINE) 2 % viscous mouth solution 15 mL (15 mLs Oral Given 11/17/18 1949)     Initial Impression / Assessment and Plan / ED Course  I have reviewed the triage vital signs and the nursing notes.  Pertinent labs & imaging results that were available during my care of the patient were reviewed by me and considered in my medical decision making (see chart for details).  Pt afebrile, non toxic appearing. Pt vomiting during triage. On exam pt has RUQ tenderness. DDx includes pancreatitis, cholecystitis, biliary colic, , gastritis, hyper emesis cannabinoid,  PUD, pregnancy,  unlikely SBO. Choledocholithiasis, appendicitis. Chart review shows CT on 10/14/18 without acute findings. Work up today shows hypokalemia of 3.0, will replace with IV and PO when n/v controlled Troponin negative. CBC unremarkable. UA with lage hemoglobin, consistent with pt on menses. No signs of infection but there are signs of dehydration. Pregnancy negative. Marland Kitchen.EKG without ischemic changes. RUQ US shows ? minimal sludge, no stones or cholecystitis. Pt given IV fentanyl for pain with minimal improvement.  Nausea and emesis resolved after IV haldol. Pt tolerating PO intake. Pain improved after GI cocktail.  Evaluation does not show pathology that would require ongoing emergent intervention or inpatient treatment. I explained the diagnosis to the patient. Pain has been managed and has no complaints prior to discharge. Patient is comfortable with above plan and is stable for discharge at this time. All questions were  answered prior to disposition. Strict return precautions for returning to the ED were discussed. Encouraged follow up with PCP and GI. The patient was discussed with and seen by Dr. Dalene SeltzerSchlossman who agrees with the treatment plan.   This note was prepared using Dragon voice recognition software and may include unintentional dictation errors due to the inherent limitations of voice recognition software.    Final Clinical Impressions(s) / ED Diagnoses   Final diagnoses:  RUQ abdominal pain    ED Discharge Orders    None       Kathyrn Lasslbrizze, Kaitlyn E, PA-C 11/18/18 2231    Alvira MondaySchlossman, Erin, MD 11/27/18 1132

## 2018-11-17 NOTE — Telephone Encounter (Signed)
Pt was in er 5/6, given protonix. The pharmacist will not fill because it is a er doctor. Asking that you fill this script for pt. It is ok to send?

## 2018-11-17 NOTE — ED Notes (Signed)
Ultrasound at bedside

## 2018-11-17 NOTE — Discharge Instructions (Addendum)
There are many causes of abdominal pain. Most pain is not serious and goes away, but some pain gets worse, changes, or will not go away. Please return to the emergency department or see your doctor right away if you (or your family member) experience any of the following:   Pain that gets worse or moves to just one spot.  Pain that gets worse if you cough or sneeze.  Pain with going over a bump in the road.  Pain that does not get better in 24 hours.  Inability to keep down liquids (vomiting)--especially if you are making less urine.  Fainting.  Blood in the vomit or stool.  High fever or shaking chills.  Swelling of the abdomen.  Any new or worsening problem.   Follow-up Instructions  Follow-up with primary care doctor in 2 to 5 days Medications  Take the following medications:    Additional Instructions  No alcohol.  No caffeine, aspirin, or cigarettes.

## 2018-11-17 NOTE — ED Notes (Signed)
Pt ambulated to BR with steady gate.

## 2018-11-17 NOTE — ED Notes (Signed)
Pts HR drops into low 40s and O2 Sat drops as she breathes slower.  Pt placed on 2LO2 via [place].  EDP notified.  HR between 40s and 70s.

## 2018-11-18 ENCOUNTER — Emergency Department (HOSPITAL_BASED_OUTPATIENT_CLINIC_OR_DEPARTMENT_OTHER)
Admission: EM | Admit: 2018-11-18 | Discharge: 2018-11-18 | Disposition: A | Discharge status: Home / Self Care | Payer: BC Managed Care – PPO | Attending: Emergency Medicine | Admitting: Emergency Medicine

## 2018-11-18 ENCOUNTER — Other Ambulatory Visit: Payer: Self-pay

## 2018-11-18 ENCOUNTER — Encounter (HOSPITAL_BASED_OUTPATIENT_CLINIC_OR_DEPARTMENT_OTHER): Payer: Self-pay | Admitting: *Deleted

## 2018-11-18 ENCOUNTER — Other Ambulatory Visit: Payer: Self-pay | Admitting: Emergency Medicine

## 2018-11-18 DIAGNOSIS — I5022 Chronic systolic (congestive) heart failure: Secondary | ICD-10-CM | POA: Diagnosis not present

## 2018-11-18 DIAGNOSIS — R1011 Right upper quadrant pain: Secondary | ICD-10-CM | POA: Insufficient documentation

## 2018-11-18 DIAGNOSIS — Z87891 Personal history of nicotine dependence: Secondary | ICD-10-CM | POA: Insufficient documentation

## 2018-11-18 DIAGNOSIS — Z79899 Other long term (current) drug therapy: Secondary | ICD-10-CM | POA: Insufficient documentation

## 2018-11-18 DIAGNOSIS — K2970 Gastritis, unspecified, without bleeding: Secondary | ICD-10-CM

## 2018-11-18 LAB — CBC
HCT: 37.7 % (ref 36.0–46.0)
Hemoglobin: 12.8 g/dL (ref 12.0–15.0)
MCH: 30.8 pg (ref 26.0–34.0)
MCHC: 34 g/dL (ref 30.0–36.0)
MCV: 90.6 fL (ref 80.0–100.0)
Platelets: 258 10*3/uL (ref 150–400)
RBC: 4.16 MIL/uL (ref 3.87–5.11)
RDW: 13.2 % (ref 11.5–15.5)
WBC: 9.7 10*3/uL (ref 4.0–10.5)
nRBC: 0 % (ref 0.0–0.2)

## 2018-11-18 LAB — URINALYSIS, MICROSCOPIC (REFLEX): RBC / HPF: >50 RBC/hpf (ref 0–5)

## 2018-11-18 LAB — URINALYSIS, ROUTINE W REFLEX MICROSCOPIC
Bilirubin Urine: NEGATIVE
Glucose, UA: NEGATIVE mg/dL
Ketones, ur: 40 mg/dL — AB
Nitrite: NEGATIVE
Protein, ur: 30 mg/dL — AB
Specific Gravity, Urine: 1.025 (ref 1.005–1.030)
pH: 7 (ref 5.0–8.0)

## 2018-11-18 LAB — COMPREHENSIVE METABOLIC PANEL
ALT: 29 U/L (ref 0–44)
AST: 27 U/L (ref 15–41)
Albumin: 4.3 g/dL (ref 3.5–5.0)
Alkaline Phosphatase: 35 U/L — ABNORMAL LOW (ref 38–126)
Anion gap: 11 (ref 5–15)
BUN: 7 mg/dL (ref 6–20)
CO2: 20 mmol/L — ABNORMAL LOW (ref 22–32)
Calcium: 8.6 mg/dL — ABNORMAL LOW (ref 8.9–10.3)
Chloride: 105 mmol/L (ref 98–111)
Creatinine, Ser: 0.77 mg/dL (ref 0.44–1.00)
GFR calc Af Amer: >60 mL/min (ref >60)
GFR calc non Af Amer: >60 mL/min (ref >60)
Glucose, Bld: 134 mg/dL — ABNORMAL HIGH (ref 70–99)
Potassium: 2.8 mmol/L — ABNORMAL LOW (ref 3.5–5.1)
Sodium: 136 mmol/L (ref 135–145)
Total Bilirubin: 0.6 mg/dL (ref 0.3–1.2)
Total Protein: 7 g/dL (ref 6.5–8.1)

## 2018-11-18 LAB — LIPASE, BLOOD: Lipase: 26 U/L (ref 11–51)

## 2018-11-18 LAB — PREGNANCY, URINE: Preg Test, Ur: NEGATIVE

## 2018-11-18 MED ORDER — SODIUM CHLORIDE 0.9 % IV BOLUS
1000.0000 mL | Freq: Once | INTRAVENOUS | Status: AC
  Administered 2018-11-18: 1000 mL via INTRAVENOUS

## 2018-11-18 MED ORDER — POTASSIUM CHLORIDE ER 20 MEQ PO TBCR: 20.0000 meq | EXTENDED_RELEASE_TABLET | Freq: Every day | ORAL | 0 refills | Status: DC

## 2018-11-18 MED ORDER — MORPHINE SULFATE (PF) 2 MG/ML IV SOLN
2.0000 mg | Freq: Once | INTRAVENOUS | Status: AC | PRN
  Administered 2018-11-18: 2 mg via INTRAVENOUS
  Filled 2018-11-18: qty 1

## 2018-11-18 MED ORDER — ALUM & MAG HYDROXIDE-SIMETH 200-200-20 MG/5ML PO SUSP
30.0000 mL | Freq: Once | ORAL | Status: AC
  Administered 2018-11-18: 30 mL via ORAL
  Filled 2018-11-18: qty 30

## 2018-11-18 MED ORDER — ONDANSETRON HCL 4 MG/2ML IJ SOLN
4.0000 mg | Freq: Once | INTRAMUSCULAR | Status: AC
  Administered 2018-11-18: 4 mg via INTRAVENOUS
  Filled 2018-11-18: qty 2

## 2018-11-18 MED ORDER — PANTOPRAZOLE SODIUM 40 MG PO TBEC: 40.0000 mg | DELAYED_RELEASE_TABLET | Freq: Every day | ORAL | 0 refills | Status: DC

## 2018-11-18 MED ORDER — SODIUM CHLORIDE 0.9% FLUSH
3.0000 mL | Freq: Once | INTRAVENOUS | Status: DC
  Filled 2018-11-18: qty 3

## 2018-11-18 MED ORDER — POTASSIUM CHLORIDE CRYS ER 20 MEQ PO TBCR
40.0000 meq | EXTENDED_RELEASE_TABLET | Freq: Once | ORAL | Status: AC
  Administered 2018-11-18: 40 meq via ORAL
  Filled 2018-11-18: qty 2

## 2018-11-18 MED ORDER — PANTOPRAZOLE SODIUM 40 MG PO TBEC: 40.0000 mg | DELAYED_RELEASE_TABLET | Freq: Every day | ORAL | 1 refills | Status: DC

## 2018-11-18 NOTE — ED Triage Notes (Signed)
Abdominal pain and vomiting. She was seen here yesterday for same.

## 2018-11-18 NOTE — ED Provider Notes (Signed)
[facility] EMERGENCY DEPARTMENT Provider Note   CSN: [id number] Arrival date & time: 11/18/18  1938  History   Chief Complaint No chief complaint on file.   HPI Tina Colon is a 38 y.o. female with PMH significant for ETOH abuse, gastritis, pancreatitis, heart failure, MJ use here for periodic RUQ abdominal pain with N/V that has been going on for years but with recent episodes yesterday and today. Was seen in [facility] ED yesterday, had relief with antiemetics and pain medications. States her abdominal pain started again about 2-3pm today with nausea and nonbloody vomiting. She has seen had difficulties keeping food down. She has not tried any medication at home. Pain is located in RUQ quadrant, "feels like a knot," denies radiation of pain.      Past Medical History:  Diagnosis Date  . Cardiomyopathy ([place])   . ETOH abuse   . Gastritis   . GERD (gastroesophageal reflux disease)   . Heart failure ([place])   . Pancreatitis     Patient Active Problem List   Diagnosis Date Noted  . Chronic systolic heart failure ([place]) 08/12/2018  . Bilateral pleural effusion 12/23/2017  . Tachycardia 12/23/2017  . Hypokalemia 12/23/2017  . CHF (congestive heart failure) ([place]) 12/23/2017  . Routine general medical examination at a health care facility 10/02/2017  . Gastritis 06/07/2015  . Alcohol abuse 06/07/2015  . Cannabis abuse 06/07/2015    Past Surgical History:  Procedure Laterality Date  . WISDOM TOOTH EXTRACTION       OB History   No obstetric history on file.     Home Medications    Prior to Admission medications   Medication Sig Start Date End Date Taking? Authorizing Provider  CORLANOR 5 MG TABS tablet Take 1 tablet by mouth twice daily 10/25/18  Yes Patwardhan, Manish J, MD  ENTRESTO 49-51 MG Take 1 tablet by mouth twice daily 10/01/18  Yes Patwardhan, Reynold Bowen, MD  ESTARYLLA 0.25-35 MG-MCG tablet Take 1 tablet by mouth daily. 03/09/18  Yes [provider]  folic acid (FOLVITE) 1 MG tablet Take 1 tablet (1 mg total) by mouth daily. 12/26/17  Yes Georgette Shell, MD  furosemide (LASIX) 20 MG tablet Take 1 tablet (20 mg total) by mouth daily. 12/25/17  Yes Georgette Shell, MD  metoCLOPramide (REGLAN) 10 MG tablet Take 1 tablet (10 mg total) by mouth every 6 (six) hours as needed for nausea (or headache). 07/17/23  Yes Delora Fuel, MD  metoprolol succinate (TOPROL-XL) 25 MG 24 hr tablet Take 0.5 tablets (12.5 mg total) by mouth daily. 12/26/17  Yes Georgette Shell, MD  norgestimate-ethinyl estradiol (ORTHO-CYCLEN,SPRINTEC,PREVIFEM) 0.25-35 MG-MCG tablet Take 1 tablet by mouth daily.   Yes [provider]  pantoprazole (PROTONIX) 40 MG tablet Take 1 tablet (40 mg total) by mouth daily. 11/18/18  Yes Sagardia, Ines Bloomer, MD  promethazine (PHENERGAN) 25 MG suppository Place 1 suppository (25 mg total) rectally every 6 (six) hours as needed for nausea or vomiting. 10/13/18  Yes Hayden Rasmussen, MD  spironolactone (ALDACTONE) 25 MG tablet Take 1 tablet (25 mg total) by mouth daily. 12/26/17  Yes Georgette Shell, MD  thiamine (VITAMIN B-1) 100 MG tablet Take 100 mg by mouth daily.   Yes [provider]  vitamin E (VITAMIN E) 400 UNIT capsule Take 400 Units by mouth daily.   Yes [provider]  acetaminophen (TYLENOL) 325 MG tablet Take 650 mg by mouth every 6 (six) hours as  needed for moderate pain.    [provider]  potassium chloride 20 MEQ TBCR Take 20 mEq by mouth daily for 4 days. 11/18/18 11/22/18  Ellwood Dense, DO    Family History Family History  Problem Relation Age of Onset  . Diabetes Mother   . Hyperlipidemia Mother   . Hypertension Father   . Gout Father   . Stomach cancer Maternal Grandfather   . Diabetes Other   . Heart disease Other   . Irritable bowel syndrome Other     Social History Social History   Tobacco Use  . Smoking status: Former Smoker    Packs/day: 0.50     Years: 10.00    Pack years: 5.00    Types: Cigarettes  . Smokeless tobacco: Never Used  Substance Use Topics  . Alcohol use: Not Currently    Frequency: Never  . Drug use: Yes    Types: Marijuana    Comment: last used 11/16/18    Allergies   Penicillins  Review of Systems Review of Systems  Constitutional: Positive for chills. Negative for fever.  Respiratory: Negative for cough and shortness of breath.   Cardiovascular: Negative for chest pain and leg swelling.  Gastrointestinal: Positive for abdominal pain, nausea and vomiting. Negative for constipation and diarrhea.  Genitourinary: Negative for difficulty urinating and dysuria.  Neurological: Positive for headaches.   Physical Exam Updated Vital Signs BP (!) 169/84   Pulse (!) 58   Temp 98.4 F (36.9 C) (Oral)   Resp 16   Ht 5' (1.524 m)   Wt 81.6 kg   LMP 11/16/2018   SpO2 99%   BMI 35.15 kg/m   Physical Exam Constitutional:      General: She is in acute distress.     Appearance: She is not toxic-appearing or diaphoretic.  HENT:     Head: Normocephalic.     Mouth/Throat:     Mouth: Mucous membranes are moist.     Pharynx: Oropharynx is clear.  Neck:     Musculoskeletal: Normal range of motion.  Cardiovascular:     Rate and Rhythm: Normal rate and regular rhythm.     Heart sounds: Normal heart sounds. No murmur. No friction rub. No gallop.   Pulmonary:     Effort: Pulmonary effort is normal. No respiratory distress.     Breath sounds: Normal breath sounds. No wheezing, rhonchi or rales.  Abdominal:     General: Bowel sounds are normal.     Palpations: Abdomen is soft. There is no mass.     Tenderness: There is no right CVA tenderness, left CVA tenderness or guarding.     Comments: RUQ and epigastric tenderness. +Murphy's sign.  Musculoskeletal: Normal range of motion.     Right lower leg: No edema.     Left lower leg: No edema.  Skin:    General: Skin is warm and dry.     Coloration: Skin is not  jaundiced.     Findings: No rash.  Neurological:     Mental Status: She is alert and oriented to person, place, and time.    ED Treatments / Results  Labs (all labs ordered are listed, but only abnormal results are displayed) Labs Reviewed  URINALYSIS, ROUTINE W REFLEX MICROSCOPIC - Abnormal; Notable for the following components:      Result Value   Hgb urine dipstick LARGE (*)    Ketones, ur 40 (*)    Protein, ur 30 (*)    Leukocytes,Ua SMALL (*)  All other components within normal limits  URINALYSIS, MICROSCOPIC (REFLEX) - Abnormal; Notable for the following components:   Bacteria, UA RARE (*)    All other components within normal limits  COMPREHENSIVE METABOLIC PANEL - Abnormal; Notable for the following components:   Potassium 2.8 (*)    CO2 20 (*)    Glucose, Bld 134 (*)    Calcium 8.6 (*)    Alkaline Phosphatase 35 (*)    All other components within normal limits  PREGNANCY, URINE  CBC  LIPASE, BLOOD    EKG None  Radiology [place] Abdomen Limited Ruq  Result Date: 11/17/2018 CLINICAL DATA:  Right upper quadrant abdominal pain and vomiting for 1 day EXAM: ULTRASOUND ABDOMEN LIMITED RIGHT UPPER QUADRANT COMPARISON:  10/14/2018 CT abdomen/pelvis. FINDINGS: Gallbladder: Questionable minimal layering sludge in the nondistended gallbladder. No convincing gallstones. No gallbladder wall thickening. No pericholecystic fluid. No sonographic Murphy sign. Common bile duct: Diameter: 4 mm Liver: No focal lesion identified. Within normal limits in parenchymal echogenicity. Portal vein is patent on color Doppler imaging with normal direction of blood flow towards the liver. IMPRESSION: 1. Questionable minimal layering sludge in the gallbladder, with no evidence of cholelithiasis or acute cholecystitis. 2. No biliary ductal dilatation. 3. Normal liver. Electronically Signed   By: Delbert PhenixJason A Poff M.D.   On: 11/17/2018 17:32   Procedures Procedures (including critical care time)   Medications Ordered in ED Medications  sodium chloride flush (NS) 0.9 % injection 3 mL (3 mLs Intravenous Not Given 11/18/18 2233)  alum & mag hydroxide-simeth (MAALOX/MYLANTA) 200-200-20 MG/5ML suspension 30 mL (30 mLs Oral Given 11/18/18 2113)  morphine 2 MG/ML injection 2 mg (2 mg Intravenous Given 11/18/18 2113)  ondansetron (ZOFRAN) injection 4 mg (4 mg Intravenous Given 11/18/18 2113)  sodium chloride 0.9 % bolus 1,000 mL (0 mLs Intravenous Stopped 11/18/18 2249)  potassium chloride SA (K-DUR) CR tablet 40 mEq (40 mEq Oral Given 11/18/18 2240)    Initial Impression / Assessment and Plan / ED Course  I have reviewed the triage vital signs and the nursing notes.  Pertinent labs & imaging results that were available during my care of the patient were reviewed by me and considered in my medical decision making (see chart for details).   38yo F with PMH of ETOH abuse, gastritis, GERD, pancreatitis, heart failure, MJ use here for RUQ abdominal pain since 2-3pm this afternoon. Has h/o RUQ abdominal pain and evaluated in ED yesterday with CMP, CBC w/ diff, troponin, lipase largely wnl. EKG at that time without ST changes. U/A consistent with dehydration. RUQ US with minimal sludge but without evidence for cholecystitis or cholelithasis. CT Abd/Pelvis 10/14/18 for same without abnormality noted. Has also been evaluated for this in the past with endoscopy in 2017 without abnormalities.   On exam today, afebrile, slightly hypertensive, TTP in RUQ with +Murphy's sign. No stones seen on RUQ US yesterday but could have spastic gallbladder causing pain not seen on US. Bedside RUQ US performed again without stones or increased wall thickening.   CBC, lipase, U/A wnl, not likely pancreatitis, renal stone, urinary infection. CMP with hypokalemia, likely 2/2 vomiting, repleted in ED. Otherwise no other abnormalities noted. Also considered viral gastroenteritis but not likely given duration of symptoms. Could also  have cannabis hyperemesis syndrome given h/o marijuana use.   Reevaluated after GI cocktail, morphine, zofran, NS bolus with improved pain. Patient amenable to discharge with GI and PCP follow up with antiemetic and pain control at home. Return precautions discussed.  Final Clinical Impressions(s) / ED Diagnoses   Final diagnoses:  Right upper quadrant abdominal pain    ED Discharge Orders         Ordered    potassium chloride 20 MEQ TBCR  Daily     11/18/18 2302           Ellwood DenseRumball, Vasiliki Smaldone, DO 11/18/18 2309    Blane OharaZavitz, Joshua, MD 11/18/18 2312

## 2018-11-18 NOTE — ED Notes (Signed)
Unable to obtain labs, multiple attempts made by different staff members. Dr. Reather Converse made aware.

## 2018-11-18 NOTE — ED Provider Notes (Signed)
Ultrasound ED Abd  Date/Time: 11/18/2018 10:24 PM Performed by: Elnora Morrison, MD Authorized by: Elnora Morrison, MD   Procedure details:    Indications: abdominal pain     Hepatobiliary:  Visualized       Hepatobiliary findings:    Common bile duct:  Normal   Gallbladder wall:  Normal   Gallbladder stones: not identified    Ultrasound ED Peripheral IV (Provider)  Date/Time: 11/18/2018 10:24 PM Performed by: Elnora Morrison, MD Authorized by: Elnora Morrison, MD   Procedure details:    Indications: poor IV access     Skin Prep: chlorhexidine gluconate     Location:  Left AC   Angiocath:  20 G   Bedside Ultrasound Guided: Yes     Images: archived     Patient tolerated procedure without complications: Yes     Dressing applied: Yes    .procATTENDING SUPERVISORY NOTE I have personally seen and examined the patient, and discussed the plan of care with the resident physician.   I have reviewed the documentation of the resident and agree.   No diagnosis found.     Elnora Morrison, MD 11/18/18 2311

## 2018-11-18 NOTE — Discharge Instructions (Addendum)
You were seen for abdominal pain, nausea, and vomiting. Your labs and ultrasound were unrevealing for infection and not concerning for gallstones. You should take zofran as needed for nausea and tylenol as needed for pain. Take 1 tablet of potassium daily for the next 4 days. Given your pain and nausea has been ongoing for some time, you should follow up with your primary doctor and gastroenterologist for further workup.

## 2018-11-23 ENCOUNTER — Encounter (HOSPITAL_BASED_OUTPATIENT_CLINIC_OR_DEPARTMENT_OTHER): Payer: Self-pay | Admitting: *Deleted

## 2018-11-23 ENCOUNTER — Other Ambulatory Visit: Payer: Self-pay

## 2018-11-23 ENCOUNTER — Emergency Department (HOSPITAL_BASED_OUTPATIENT_CLINIC_OR_DEPARTMENT_OTHER)
Admission: EM | Admit: 2018-11-23 | Discharge: 2018-11-23 | Disposition: A | Discharge status: Home / Self Care | Payer: BC Managed Care – PPO | Attending: Emergency Medicine | Admitting: Emergency Medicine

## 2018-11-23 ENCOUNTER — Ambulatory Visit: Payer: BLUE CROSS/BLUE SHIELD | Admitting: Family Medicine

## 2018-11-23 DIAGNOSIS — R1084 Generalized abdominal pain: Secondary | ICD-10-CM | POA: Diagnosis not present

## 2018-11-23 DIAGNOSIS — Z87891 Personal history of nicotine dependence: Secondary | ICD-10-CM | POA: Insufficient documentation

## 2018-11-23 DIAGNOSIS — I509 Heart failure, unspecified: Secondary | ICD-10-CM | POA: Insufficient documentation

## 2018-11-23 DIAGNOSIS — R1010 Upper abdominal pain, unspecified: Secondary | ICD-10-CM | POA: Diagnosis present

## 2018-11-23 DIAGNOSIS — Z79899 Other long term (current) drug therapy: Secondary | ICD-10-CM | POA: Insufficient documentation

## 2018-11-23 LAB — CBC WITH DIFFERENTIAL/PLATELET
Abs Immature Granulocytes: 0.05 10*3/uL (ref 0.00–0.07)
Basophils Absolute: 0.1 10*3/uL (ref 0.0–0.1)
Basophils Relative: 1 %
Eosinophils Absolute: 0.1 10*3/uL (ref 0.0–0.5)
Eosinophils Relative: 1 %
HCT: 40.6 % (ref 36.0–46.0)
Hemoglobin: 13.9 g/dL (ref 12.0–15.0)
Immature Granulocytes: 0 %
Lymphocytes Relative: 19 %
Lymphs Abs: 2.2 10*3/uL (ref 0.7–4.0)
MCH: 30.4 pg (ref 26.0–34.0)
MCHC: 34.2 g/dL (ref 30.0–36.0)
MCV: 88.8 fL (ref 80.0–100.0)
Monocytes Absolute: 0.6 10*3/uL (ref 0.1–1.0)
Monocytes Relative: 6 %
Neutro Abs: 8.2 10*3/uL — ABNORMAL HIGH (ref 1.7–7.7)
Neutrophils Relative %: 73 %
Platelets: 269 10*3/uL (ref 150–400)
RBC: 4.57 MIL/uL (ref 3.87–5.11)
RDW: 12.5 % (ref 11.5–15.5)
WBC: 11.2 10*3/uL — ABNORMAL HIGH (ref 4.0–10.5)
nRBC: 0 % (ref 0.0–0.2)

## 2018-11-23 LAB — COMPREHENSIVE METABOLIC PANEL
ALT: 24 U/L (ref 0–44)
AST: 30 U/L (ref 15–41)
Albumin: 4.5 g/dL (ref 3.5–5.0)
Alkaline Phosphatase: 45 U/L (ref 38–126)
Anion gap: 13 (ref 5–15)
BUN: 14 mg/dL (ref 6–20)
CO2: 21 mmol/L — ABNORMAL LOW (ref 22–32)
Calcium: 9.4 mg/dL (ref 8.9–10.3)
Chloride: 103 mmol/L (ref 98–111)
Creatinine, Ser: 0.99 mg/dL (ref 0.44–1.00)
GFR calc Af Amer: >60 mL/min (ref >60)
GFR calc non Af Amer: >60 mL/min (ref >60)
Glucose, Bld: 128 mg/dL — ABNORMAL HIGH (ref 70–99)
Potassium: 3.4 mmol/L — ABNORMAL LOW (ref 3.5–5.1)
Sodium: 137 mmol/L (ref 135–145)
Total Bilirubin: 0.8 mg/dL (ref 0.3–1.2)
Total Protein: 7.8 g/dL (ref 6.5–8.1)

## 2018-11-23 LAB — LIPASE, BLOOD: Lipase: 38 U/L (ref 11–51)

## 2018-11-23 MED ORDER — MORPHINE SULFATE (PF) 4 MG/ML IV SOLN
8.0000 mg | Freq: Once | INTRAVENOUS | Status: AC
  Administered 2018-11-23: 19:00:00 8 mg via INTRAVENOUS
  Filled 2018-11-23: qty 2

## 2018-11-23 MED ORDER — SODIUM CHLORIDE 0.9 % IV BOLUS
1000.0000 mL | Freq: Once | INTRAVENOUS | Status: AC
  Administered 2018-11-23: 18:00:00 1000 mL via INTRAVENOUS

## 2018-11-23 MED ORDER — HALOPERIDOL LACTATE 5 MG/ML IJ SOLN
2.0000 mg | Freq: Once | INTRAMUSCULAR | Status: AC
  Administered 2018-11-23: 2 mg via INTRAVENOUS
  Filled 2018-11-23: qty 1

## 2018-11-23 MED ORDER — CAPSAICIN 0.025 % EX CREA
TOPICAL_CREAM | Freq: Once | CUTANEOUS | Status: AC
  Administered 2018-11-23: 18:00:00 via TOPICAL
  Filled 2018-11-23 (×2): qty 60

## 2018-11-23 MED ORDER — ONDANSETRON HCL 4 MG/2ML IJ SOLN
4.0000 mg | Freq: Once | INTRAMUSCULAR | Status: AC
  Administered 2018-11-23: 19:00:00 4 mg via INTRAVENOUS
  Filled 2018-11-23: qty 2

## 2018-11-23 NOTE — ED Notes (Signed)
Unable to provide urine at this time.

## 2018-11-23 NOTE — ED Triage Notes (Signed)
Abdominal pain and vomiting. States this is an on going problem she has.

## 2018-11-23 NOTE — ED Notes (Signed)
Pt. Given pain meds due to complaints of nothing helping her pain.  Pt. Placed on 2 liter of oxygen due to sats of 89% after pain meds given

## 2018-11-24 NOTE — ED Provider Notes (Signed)
[facility] EMERGENCY DEPARTMENT Provider Note   CSN: [id number] Arrival date & time: 11/23/18  1727     History   Chief Complaint Chief Complaint  Patient presents with  . Abdominal Pain    HPI Tina Colon is a 38 y.o. female.     HPI   50yF with abdominal pain. Recurrent. Says coming back again today. Pain mostly in upper abdomen. Associated with n/v. recurrent visits to the ED for the same. Says she has been unable to keep down her medications at home.   Past Medical History:  Diagnosis Date  . Cardiomyopathy ([place])   . ETOH abuse   . Gastritis   . GERD (gastroesophageal reflux disease)   . Heart failure ([place])   . Pancreatitis     Patient Active Problem List   Diagnosis Date Noted  . Chronic systolic heart failure ([place]) 08/12/2018  . Bilateral pleural effusion 12/23/2017  . Tachycardia 12/23/2017  . Hypokalemia 12/23/2017  . CHF (congestive heart failure) ([place]) 12/23/2017  . Routine general medical examination at a health care facility 10/02/2017  . Gastritis 06/07/2015  . Alcohol abuse 06/07/2015  . Cannabis abuse 06/07/2015    Past Surgical History:  Procedure Laterality Date  . WISDOM TOOTH EXTRACTION       OB History   No obstetric history on file.      Home Medications    Prior to Admission medications   Medication Sig Start Date End Date Taking? Authorizing Provider  acetaminophen (TYLENOL) 325 MG tablet Take 650 mg by mouth every 6 (six) hours as needed for moderate pain.    [provider]  CORLANOR 5 MG TABS tablet Take 1 tablet by mouth twice daily 10/25/18   Patwardhan, Reynold Bowen, MD  ENTRESTO 49-51 MG Take 1 tablet by mouth twice daily 10/01/18   Patwardhan, Reynold Bowen, MD  ESTARYLLA 0.25-35 MG-MCG tablet Take 1 tablet by mouth daily. 03/09/18   [provider]  folic acid (FOLVITE) 1 MG tablet Take 1 tablet (1 mg total) by mouth daily. 12/26/17   Georgette Shell, MD  furosemide (LASIX) 20 MG tablet Take 1  tablet (20 mg total) by mouth daily. 12/25/17   Georgette Shell, MD  metoCLOPramide (REGLAN) 10 MG tablet Take 1 tablet (10 mg total) by mouth every 6 (six) hours as needed for nausea (or headache). 0/1/75   Delora Fuel, MD  metoprolol succinate (TOPROL-XL) 25 MG 24 hr tablet Take 0.5 tablets (12.5 mg total) by mouth daily. 12/26/17   Georgette Shell, MD  norgestimate-ethinyl estradiol (ORTHO-CYCLEN,SPRINTEC,PREVIFEM) 0.25-35 MG-MCG tablet Take 1 tablet by mouth daily.    [provider]  pantoprazole (PROTONIX) 40 MG tablet Take 1 tablet (40 mg total) by mouth daily. 11/18/18   Horald Pollen, MD  potassium chloride 20 MEQ TBCR Take 20 mEq by mouth daily for 4 days. 11/18/18 11/22/18  Rory Percy, DO  promethazine (PHENERGAN) 25 MG suppository Place 1 suppository (25 mg total) rectally every 6 (six) hours as needed for nausea or vomiting. 10/13/18   Hayden Rasmussen, MD  spironolactone (ALDACTONE) 25 MG tablet Take 1 tablet (25 mg total) by mouth daily. 12/26/17   Georgette Shell, MD  thiamine (VITAMIN B-1) 100 MG tablet Take 100 mg by mouth daily.    [provider]  vitamin E (VITAMIN E) 400 UNIT capsule Take 400 Units by mouth daily.    [provider]    Family History Family History  Problem  Relation Age of Onset  . Diabetes Mother   . Hyperlipidemia Mother   . Hypertension Father   . Gout Father   . Stomach cancer Maternal Grandfather   . Diabetes Other   . Heart disease Other   . Irritable bowel syndrome Other     Social History Social History   Tobacco Use  . Smoking status: Former Smoker    Packs/day: 0.50    Years: 10.00    Pack years: 5.00    Types: Cigarettes  . Smokeless tobacco: Never Used  Substance Use Topics  . Alcohol use: Not Currently    Frequency: Never  . Drug use: Yes    Types: Marijuana    Comment: last used 11/16/18     Allergies   Penicillins   Review of Systems Review of Systems  All systems  reviewed and negative, other than as noted in HPI.  Physical Exam Updated Vital Signs BP 131/77 (BP Location: Left Arm)   Pulse 94   Temp 98.2 F (36.8 C) (Oral)   Resp 12   Ht 5' (1.524 m)   Wt 79.2 kg   LMP 11/16/2018   SpO2 100%   BMI 34.08 kg/m   Physical Exam Vitals signs and nursing note reviewed.  Constitutional:      General: She is not in acute distress.    Appearance: She is well-developed.  HENT:     Head: Normocephalic and atraumatic.  Eyes:     General:        Right eye: No discharge.        Left eye: No discharge.     Conjunctiva/sclera: Conjunctivae normal.  Neck:     Musculoskeletal: Neck supple.  Cardiovascular:     Rate and Rhythm: Normal rate and regular rhythm.     Heart sounds: Normal heart sounds. No murmur. No friction rub. No gallop.   Pulmonary:     Effort: Pulmonary effort is normal. No respiratory distress.     Breath sounds: Normal breath sounds.  Abdominal:     General: There is no distension.     Palpations: Abdomen is soft.     Tenderness: There is abdominal tenderness. There is no guarding.     Comments: Mild ttp upper abdomen w/o rebound or guarding  Musculoskeletal:        General: No tenderness.  Skin:    General: Skin is warm and dry.  Neurological:     Mental Status: She is alert.  Psychiatric:        Behavior: Behavior normal.        Thought Content: Thought content normal.      ED Treatments / Results  Labs (all labs ordered are listed, but only abnormal results are displayed) Labs Reviewed  CBC WITH DIFFERENTIAL/PLATELET - Abnormal; Notable for the following components:      Result Value   WBC 11.2 (*)    Neutro Abs 8.2 (*)    All other components within normal limits  COMPREHENSIVE METABOLIC PANEL - Abnormal; Notable for the following components:   Potassium 3.4 (*)    CO2 21 (*)    Glucose, Bld 128 (*)    All other components within normal limits  LIPASE, BLOOD    EKG  Radiology No results found.   Procedures Procedures (including critical care time)  Medications Ordered in ED Medications  sodium chloride 0.9 % bolus 1,000 mL (0 mLs Intravenous Stopped 11/23/18 1900)  haloperidol lactate (HALDOL) injection 2 mg (2 mg Intravenous  Given 11/23/18 1819)  capsaicin (ZOSTRIX) 0.025 % cream ( Topical Given 11/23/18 1822)  morphine 4 MG/ML injection 8 mg (8 mg Intravenous Given 11/23/18 1859)  ondansetron (ZOFRAN) injection 4 mg (4 mg Intravenous Given 11/23/18 1858)     Initial Impression / Assessment and Plan / ED Course  I have reviewed the triage vital signs and the nursing notes.  Pertinent labs & imaging results that were available during my care of the patient were reviewed by me and considered in my medical decision making (see chart for details).        38yF with abdominal pain. Suspect exacerbation of chronic/recurrent pain. Labs fairly unremarkable. Feeling better after meds. I doubt emergent process.   Final Clinical Impressions(s) / ED Diagnoses   Final diagnoses:  Generalized abdominal pain    ED Discharge Orders    None       Raeford RazorKohut, Keyanna Sandefer, MD 11/24/18 1016

## 2018-11-25 ENCOUNTER — Telehealth: Payer: Self-pay

## 2018-11-25 NOTE — Telephone Encounter (Signed)
Erroneous encounter

## 2018-11-26 ENCOUNTER — Ambulatory Visit: Payer: BC Managed Care – PPO | Admitting: Physician Assistant

## 2018-12-02 ENCOUNTER — Encounter: Payer: Self-pay | Admitting: Nurse Practitioner

## 2018-12-02 ENCOUNTER — Telehealth: Payer: Self-pay

## 2018-12-02 ENCOUNTER — Ambulatory Visit (INDEPENDENT_AMBULATORY_CARE_PROVIDER_SITE_OTHER): Payer: BC Managed Care – PPO | Admitting: Nurse Practitioner

## 2018-12-02 VITALS — BP 128/72 | HR 79 | Temp 98.3°F | Ht 60.0 in | Wt 187.1 lb

## 2018-12-02 DIAGNOSIS — R112 Nausea with vomiting, unspecified: Secondary | ICD-10-CM | POA: Diagnosis not present

## 2018-12-02 DIAGNOSIS — K625 Hemorrhage of anus and rectum: Secondary | ICD-10-CM | POA: Diagnosis not present

## 2018-12-02 DIAGNOSIS — R1011 Right upper quadrant pain: Secondary | ICD-10-CM

## 2018-12-02 MED ORDER — HYOSCYAMINE SULFATE 0.125 MG SL SUBL: 0.1250 mg | SUBLINGUAL_TABLET | Freq: Two times a day (BID) | SUBLINGUAL | 0 refills | Status: DC | PRN

## 2018-12-02 NOTE — Progress Notes (Signed)
ASSESSMENT / PLAN:   38 year old female with intermittent RUQ pain, nausea/vomiting.  She had the same symptoms in 2017, EGD at the time was unremarkable.  Symptoms eventually resolved, she stopped her PPI and did fine until last month.  She just had her fifth visit to the ED since symptoms recurred last month.  Labs basically unremarkable.  RUQ ultrasound shows minimal layering of sludge in the gallbladder.  She had a CT scan of the abdomen and pelvis with contrast early May and it was unremarkable. -Etiology of her RUQ pain, nausea/vomiting is unclear.  Doubt repeat EGD would be high yield in the setting of the same symptoms.  -Trial of Levsin SL the next time pain recurs.  So far she has not had any episodes since her last visit to the ED 11/23/2018.  It will be interesting to see if Levsin helps In the interim I will talk with her primary GI, Dr. Lavon Paganini, to see if she thinks surgical evaluation of gallbladder is worthwhile -continue PPI for now  2.  Slight episode of painless rectal bleeding a couple of days ago.  Movements have been normal.   -At some point time we should entertain a colonoscopy but I would like to first complete work-up of #1.  3.  HF, diagnosed last year.  She is on Entresto   HPI:    Chief Complaint:   RUQ pain and vomiting   Patient is a 38 year old female known to Dr. Lavon Paganini.  She saw Dr. Lavon Paganini in 2017 for evaluation of upper abdominal pain and vomiting.  EGD was normal.  Gastric biopsies showed reactive gastropathy.   Her pain eventually disappeared she had no further problems until last month.    In May patient developed RUQ pain, nausea / vomiting.  She went to the ED 3 times in May and has been twice so far this month.  When the episodes occur she is unable to stop vomiting until ED treats her with analgesics and antiemetics .  She describes the nonradiating RUQ pain as a tightening sensation . The episodes are completely random, not  related to food.  Episodes are associated with increased bowel movements but not diarrhea. She gets "chills with the episodes. No NSAID use.  She had any black stools.  A couple of days ago she did have isolated episode of painless rectal bleeding.  She used to take Protonix but stopped it at some point when symptoms resolved after 2017.  ED recently put her back on Protonix.  Was last seen in the ED on 11/23/2018, potassium was slightly low at 3.4, liver test normal, lipase normal. WBC 11.2, CBC o/w normal.  RUQ ultrasound showed minimal layering sludge in the gallbladder, no other findings.  Of note a CTAP w/ contrast early May was unremarkable.    Data Reviewed:  11/23/18 RUQ minimal layering gb sludge no evidence of cholelithiasis or cholecystitis.   CTAP with contrast 5/7 - unremarkable.   Past Medical History:  Diagnosis Date  . Cardiomyopathy ([facility])   . ETOH abuse   . Gastritis   . GERD (gastroesophageal reflux disease)   . Heart failure ([facility])   . Pancreatitis      Past Surgical History:  Procedure Laterality Date  . ESOPHAGOGASTRODUODENOSCOPY  06/19/2015    Dr Lavon Paganini  . WISDOM TOOTH EXTRACTION     Family History  Problem Relation Age of Onset  .  Diabetes Mother   . Hyperlipidemia Mother   . Hypertension Father   . Gout Father   . Stomach cancer Maternal Grandfather        [place] SpanielSaid that his siblings died with cancer also but not sure what kind  . Diabetes Other   . Heart disease Other   . Irritable bowel syndrome Other   . Gallstones Sister        Had her gallbladder removed   . Gallstones Paternal Aunt        said a few on her dad's side    Social History   Tobacco Use  . Smoking status: Former Smoker    Packs/day: 0.50    Years: 10.00    Pack years: 5.00    Types: Cigarettes  . Smokeless tobacco: Never Used  Substance Use Topics  . Alcohol use: Not Currently    Frequency: Never  . Drug use: Yes    Types: Marijuana    Comment: last used 11/16/18   Current  Outpatient Medications  Medication Sig Dispense Refill  . CORLANOR 5 MG TABS tablet Take 1 tablet by mouth twice daily 180 tablet 2  . ENTRESTO 49-51 MG Take 1 tablet by mouth twice daily 180 tablet 3  . folic acid (FOLVITE) 1 MG tablet Take 1 tablet (1 mg total) by mouth daily.    . furosemide (LASIX) 20 MG tablet Take 1 tablet (20 mg total) by mouth daily. (Patient taking differently: Take 20 mg by mouth as needed. ) 30 tablet 0  . metoprolol succinate (TOPROL-XL) 25 MG 24 hr tablet Take 0.5 tablets (12.5 mg total) by mouth daily. 30 tablet 0  . norgestimate-ethinyl estradiol (ORTHO-CYCLEN) 0.25-35 MG-MCG tablet Take 1 tablet by mouth daily.    . pantoprazole (PROTONIX) 40 MG tablet Take 1 tablet (40 mg total) by mouth daily. 30 tablet 1  . spironolactone (ALDACTONE) 25 MG tablet Take 1 tablet (25 mg total) by mouth daily. 30 tablet 0  . thiamine (VITAMIN B-1) 100 MG tablet Take 100 mg by mouth daily.     No current facility-administered medications for this visit.    Allergies  Allergen Reactions  . Penicillins Anaphylaxis    Has patient had a PCN reaction causing immediate rash, facial/tongue/throat swelling, SOB or lightheadedness with hypotension:Yes Has patient had a PCN reaction causing severe rash involving mucus membranes or skin necrosis:unknown Has patient had a PCN reaction that required hospitalization:Yes Has patient had a PCN reaction occurring within the last 10 years:No If all of the above answers are "NO", then may proceed with Cephalosporin use.      Review of Systems: All systems reviewed and negative except where noted in HPI.   Serum creatinine: 0.99 mg/dL 40/98/1106/16/20 91471800 Estimated creatinine clearance: 73.1 mL/min   Physical Exam:    Wt Readings from Last 3 Encounters:  12/02/18 187 lb 2 oz (84.9 kg)  11/23/18 174 lb 8 oz (79.2 kg)  11/18/18 180 lb (81.6 kg)    BP 128/72   Pulse 79   Temp 98.3 F (36.8 C)   Ht 5' (1.524 m)   Wt 187 lb 2 oz (84.9 kg)    LMP 11/16/2018   BMI 36.55 kg/m  Constitutional:  Pleasant female in no acute distress. Psychiatric: Normal mood and affect. Behavior is normal. EENT: Pupils normal.  Conjunctivae are normal. No scleral icterus. Neck supple.  Cardiovascular: Normal rate, regular rhythm. No edema Pulmonary/chest: Effort normal and breath sounds normal. No wheezing, rales or rhonchi.  Abdominal: Soft, nondistended, nontender. Bowel sounds active throughout. There are no masses palpable. No hepatomegaly. Neurological: Alert and oriented to person place and time. Skin: Skin is warm and dry. No rashes noted.  Tye Savoy, NP  12/02/2018, 1:40 PM

## 2018-12-02 NOTE — Telephone Encounter (Signed)
Covid-19 screening questions   Do you now or have you had a fever in the last 14 days? No  Do you have any respiratory symptoms of shortness of breath or cough now or in the last 14 days? No  Do you have any family members or close contacts with diagnosed or suspected Covid-19 in the past 14 days? No  Have you been tested for Covid-19 and found to be positive? No        

## 2018-12-02 NOTE — Patient Instructions (Signed)
If you are age 40 or older, your body mass index should be between 23-30. Your Body mass index is 36.55 kg/m. If this is out of the aforementioned range listed, please consider follow up with your Primary Care Provider.  If you are age 56 or younger, your body mass index should be between 19-25. Your Body mass index is 36.55 kg/m. If this is out of the aformentioned range listed, please consider follow up with your Primary Care Provider.   We have sent the following medications to your pharmacy for you to pick up at your convenience: Levsin  We will call you about ultrasound results.  Follow up with me in 6-8 weeks.  Please call the office for an appointment as the schedule is not available at this time.  Thank you for choosing me and [facility].   Tye Savoy, NP

## 2018-12-15 ENCOUNTER — Telehealth: Payer: Self-pay | Admitting: Nurse Practitioner

## 2018-12-15 ENCOUNTER — Other Ambulatory Visit: Payer: Self-pay

## 2018-12-15 DIAGNOSIS — K2970 Gastritis, unspecified, without bleeding: Secondary | ICD-10-CM

## 2018-12-15 MED ORDER — PANTOPRAZOLE SODIUM 40 MG PO TBEC: 40.0000 mg | DELAYED_RELEASE_TABLET | Freq: Every day | ORAL | 1 refills | Status: DC

## 2018-12-15 NOTE — Telephone Encounter (Signed)
Pantoprazole sent to [facility].

## 2018-12-30 NOTE — Progress Notes (Signed)
Reviewed and agree with documentation and assessment and plan. K. Veena Kayonna Lawniczak , MD   

## 2019-01-03 ENCOUNTER — Telehealth: Payer: Self-pay | Admitting: *Deleted

## 2019-01-03 NOTE — Telephone Encounter (Signed)
Called patient in voice mail to call back about Protonix medication.

## 2019-01-03 NOTE — Telephone Encounter (Signed)
Called patient to check on how Protonix medication is working.

## 2019-02-08 LAB — BASIC METABOLIC PANEL
BUN/Creatinine Ratio: 13 (ref 9–23)
BUN: 10 mg/dL (ref 6–24)
CO2: 21 mmol/L (ref 20–29)
Calcium: 9.6 mg/dL (ref 8.7–10.2)
Chloride: 102 mmol/L (ref 96–106)
Creatinine, Ser: 0.77 mg/dL (ref 0.57–1.00)
GFR calc Af Amer: 112 mL/min/{1.73_m2} (ref >59)
GFR calc non Af Amer: 97 mL/min/{1.73_m2} (ref >59)
Glucose: 88 mg/dL (ref 65–99)
Potassium: 4 mmol/L (ref 3.5–5.2)
Sodium: 137 mmol/L (ref 134–144)

## 2019-02-14 ENCOUNTER — Other Ambulatory Visit: Payer: Self-pay | Admitting: Nurse Practitioner

## 2019-02-14 DIAGNOSIS — K2970 Gastritis, unspecified, without bleeding: Secondary | ICD-10-CM

## 2019-02-15 NOTE — Progress Notes (Signed)
Patient is here for follow up visit.  Subjective:   @Patient  ID: Tina Colon, female    DOB: August 10, 1980, 38 y.o.   MRN: [id number]   Chief Complaint  Patient presents with  . Cardiomyopathy  . Follow-up    6 month    HPI   38 year old Caucasian female with nonischemic dilated cardiomyopathy, now with recovered LVEF.  She has been doing well. She has returned to her work at [employer]. She only has symptoms of dyspnea with more than usual physical exertion, such as heavy lifting at work. She is compliant with her medical therapy. She has lost weight since her last visit. Denies leg edema, orthopnea, PND. She is aware of the need to avoid pregnancy, given her cardiomyopathy and ongoing use of potentially teratogenic medications.    Past Medical History:  Diagnosis Date  . Cardiomyopathy (HCC)   . ETOH abuse   . Gastritis   . GERD (gastroesophageal reflux disease)   . Heart failure (HCC)   . Pancreatitis     Past Surgical History:  Procedure Laterality Date  . ESOPHAGOGASTRODUODENOSCOPY  06/19/2015    Dr Lavon PaganiniNandigam  . WISDOM TOOTH EXTRACTION      Social History   Socioeconomic History  . Marital status: Married    Spouse name: Not on file  . Number of children: Not on file  . Years of education: Not on file  . Highest education level: Not on file  Occupational History  . Occupation: Merchandiser, retailretail  Social Needs  . Financial resource strain: Not on file  . Food insecurity    Worry: Not on file    Inability: Not on file  . Transportation needs    Medical: Not on file    Non-medical: Not on file  Tobacco Use  . Smoking status: Former Smoker    Packs/day: 0.50    Years: 10.00    Pack years: 5.00    Types: Cigarettes  . Smokeless tobacco: Never Used  Substance and Sexual Activity  . Alcohol use: Not Currently    Frequency: Never  . Drug use: Yes    Types: Marijuana    Comment: last used 11/16/18  . Sexual activity: Not on file  Lifestyle  . Physical activity    Days per week: Not on file    Minutes per session: Not on file  . Stress: Not on file  Relationships  . Social Musicianconnections    Talks on phone: Not on file    Gets together: Not on file    Attends religious service: Not on file    Active member of club or organization: Not on file    Attends meetings of clubs or organizations: Not on file    Relationship status: Not on file  . Intimate partner violence    Fear of current or ex partner: Not on file    Emotionally abused: Not on file    Physically abused: Not on file    Forced sexual activity: Not on file  Other Topics Concern  . Not on file  Social History Narrative  . Not on file    Current Outpatient Medications on File Prior to Visit  Medication Sig Dispense Refill  . CORLANOR 5 MG TABS tablet Take 1 tablet by mouth twice daily 180 tablet 2  . ENTRESTO 49-51 MG Take 1 tablet by mouth twice daily 180 tablet 3  . folic acid (FOLVITE) 1 MG tablet Take 1 tablet (1 mg total) by mouth daily.    .Marland Kitchen  furosemide (LASIX) 20 MG tablet Take 1 tablet (20 mg total) by mouth daily. (Patient taking differently: Take 20 mg by mouth as needed. ) 30 tablet 0  . hyoscyamine (LEVSIN SL) 0.125 MG SL tablet Place 1 tablet (0.125 mg total) under the tongue 2 (two) times daily as needed. 20 tablet 0  . metoprolol succinate (TOPROL-XL) 25 MG 24 hr tablet Take 0.5 tablets (12.5 mg total) by mouth daily. 30 tablet 0  . norgestimate-ethinyl estradiol (ORTHO-CYCLEN) 0.25-35 MG-MCG tablet Take 1 tablet by mouth daily.    . pantoprazole (PROTONIX) 40 MG tablet Take 1 tablet by mouth once daily 30 tablet 2  . spironolactone (ALDACTONE) 25 MG tablet Take 1 tablet (25 mg total) by mouth daily. 30 tablet 0  . thiamine (VITAMIN B-1) 100 MG tablet Take 100 mg by mouth daily.     No current facility-administered medications on file prior to visit.     Cardiovascular studies:   Cardiac MRI 07/23/2018: 1.  Mild left ventricular enlargement with borderline LVEF 50%.  2. There is no late gadolinium enhancement in the left ventricular myocardium. 3.  Normal right ventricular chamber size and function, RVEF 56%.  Echocardiogram 05/19/2018: 1. Left ventricle cavity is mildly dilated. Severe decrease in global wall motion. Visual EF is 20-25%. 2. Mild (Grade I) mitral regurgitation. 3. Mild tricuspid regurgitation. No evidence of pulmonary Hypertension. Heart failure with reduced ejection fraction, NYHA class II (I50.20)    Hospital echocardiogram 12/24/2017: Study Conclusions: - Left ventricle: The cavity size was moderately to severely dilated. Systolic function was severely reduced. The estimated ejection fraction was 15-20%. Diffuse hypokinesis. Profound   diffuse hypokinesis throughout, with near akinesis of septal and anterior walls. - Aortic valve: Transvalvular velocity was within the normal range. There was no stenosis. There was no regurgitation. - Mitral valve: There was mild regurgitation. - Left atrium: The atrium was moderately to severely dilated. - Right ventricle: The cavity size was mildly dilated. Wall   thickness was normal. Systolic function was normal. - Tricuspid valve: There was trivial regurgitation. - Pulmonic valve: There was no significant regurgitation. - Pericardium, extracardiac: A trivial pericardial effusion was   identified posterior to the heart.  Impressions: - Severely reduced ejection fraction of 15-20%. Global severe hypokinesis with near akinesis of anterior and septal walls. Severely dilated left atrium, mild MR. No significant TR to estimate filling pressures.  CTA coronary 03/23/2018: Calcium score 0.  No evidence of CAD.  Recent Labs:  Results for Tina, Colon (MRN [id number]) as of 02/16/2019 16:05  Ref. Range 02/07/2019 11:12  Sodium Latest Ref Range: 134 - 144 mmol/L 137  Potassium Latest Ref Range: 3.5 - 5.2 mmol/L 4.0  Chloride Latest Ref Range: 96 - 106 mmol/L 102  CO2 Latest Ref Range: 20 - 29 mmol/L 21   Glucose Latest Ref Range: 65 - 99 mg/dL 88  BUN Latest Ref Range: 6 - 24 mg/dL 10  Creatinine Latest Ref Range: 0.57 - 1.00 mg/dL 0.77  Calcium Latest Ref Range: 8.7 - 10.2 mg/dL 9.6  BUN/Creatinine Ratio Latest Ref Range: 9 - 23  13  GFR, Est Non African American Latest Ref Range: >59 mL/min/1.73 97  GFR, Est African American Latest Ref Range: >59 mL/min/1.73 112   Results for MISCHELE, DETTER (MRN [id number]) as of 02/16/2019 16:05  Ref. Range 11/23/2018 18:00  WBC Latest Ref Range: 4.0 - 10.5 K/uL 11.2 (H)  RBC Latest Ref Range: 3.87 - 5.11 MIL/uL 4.57  Hemoglobin Latest Ref Range:  12.0 - 15.0 g/dL 92.1  HCT Latest Ref Range: 36.0 - 46.0 % 40.6  MCV Latest Ref Range: 80.0 - 100.0 fL 88.8  MCH Latest Ref Range: 26.0 - 34.0 pg 30.4  MCHC Latest Ref Range: 30.0 - 36.0 g/dL 19.4  RDW Latest Ref Range: 11.5 - 15.5 % 12.5  Platelets Latest Ref Range: 150 - 400 K/uL 269  nRBC Latest Ref Range: 0.0 - 0.2 % 0.0     Review of Systems  Constitution: Negative for decreased appetite, malaise/fatigue, weight gain and weight loss.  HENT: Negative for congestion.   Eyes: Negative for visual disturbance.  Cardiovascular: Negative for chest pain, dyspnea on exertion, leg swelling, palpitations and syncope.  Respiratory: Negative for shortness of breath.   Endocrine: Negative for cold intolerance.  Hematologic/Lymphatic: Does not bruise/bleed easily.  Skin: Negative for itching and rash.  Musculoskeletal: Negative for myalgias.  Gastrointestinal: Negative for abdominal pain, nausea and vomiting.  Genitourinary: Negative for dysuria.  Neurological: Negative for dizziness and weakness.  Psychiatric/Behavioral: The patient is not nervous/anxious.   All other systems reviewed and are negative.      Objective:    Vitals:   02/16/19 0945  BP: 114/75  Pulse: 61  Temp: (!) 96.7 F (35.9 C)  SpO2: 98%     Physical Exam  Constitutional: She is oriented to person, place, and time. She appears  well-developed and well-nourished. No distress.  HENT:  Head: Normocephalic and atraumatic.  Eyes: Pupils are equal, round, and reactive to light. Conjunctivae are normal.  Neck: No JVD present.  Cardiovascular: Normal rate, regular rhythm and intact distal pulses.  Pulmonary/Chest: Effort normal and breath sounds normal. She has no wheezes. She has no rales.  Abdominal: Soft. Bowel sounds are normal. There is no rebound.  Musculoskeletal:        General: No edema.  Lymphadenopathy:    She has no cervical adenopathy.  Neurological: She is alert and oriented to person, place, and time. No cranial nerve deficit.  Skin: Skin is warm and dry.  Psychiatric: She has a normal mood and affect.  Nursing note and vitals reviewed.       Assessment & Recommendations:   38 year old Caucasian female with nonischemic dilated cardiomyopathy, now with recovered LVEF   1. Chronic systolic heart failure (HCC) Nonischemic cardiomyopathy. Well compensated. EF recovered to 50% on cardiac MRI 07/2018. Continue Entresto 49-51 mg bid, spironolactone to 25 mg daily, metoprolol succinate 25 mg daily, corlanor 5 mg bid. She is on oral contraceptive therapy. It remains crucial that she avoids pregnancy, given the teratogenic risks associated with the medications.   F/u in 6 months.  Elder Negus, MD [facility] [place] Pager: [phone] Office: [phone] If no answer Cell [phone]

## 2019-02-16 ENCOUNTER — Encounter: Payer: Self-pay | Admitting: Cardiology

## 2019-02-16 ENCOUNTER — Other Ambulatory Visit: Payer: Self-pay

## 2019-02-16 ENCOUNTER — Ambulatory Visit (INDEPENDENT_AMBULATORY_CARE_PROVIDER_SITE_OTHER): Payer: BC Managed Care – PPO | Admitting: Cardiology

## 2019-02-16 VITALS — BP 114/75 | HR 61 | Temp 96.7°F | Ht 60.0 in | Wt 179.0 lb

## 2019-02-16 DIAGNOSIS — I5022 Chronic systolic (congestive) heart failure: Secondary | ICD-10-CM | POA: Diagnosis not present

## 2019-03-01 ENCOUNTER — Other Ambulatory Visit: Payer: Self-pay

## 2019-03-01 DIAGNOSIS — Z20822 Contact with and (suspected) exposure to covid-19: Secondary | ICD-10-CM

## 2019-03-02 ENCOUNTER — Telehealth: Payer: Self-pay | Admitting: *Deleted

## 2019-03-02 LAB — NOVEL CORONAVIRUS, NAA: SARS-CoV-2, NAA: NOT DETECTED

## 2019-03-02 NOTE — Telephone Encounter (Signed)
Patient is calling for test result- notified negative. Patient tested for possible exposure. Patient advised to continue safe precautions, notify PCP for changes and get flu vaccine.

## 2019-04-06 ENCOUNTER — Other Ambulatory Visit: Payer: Self-pay

## 2019-04-06 DIAGNOSIS — Z20822 Contact with and (suspected) exposure to covid-19: Secondary | ICD-10-CM

## 2019-04-07 LAB — NOVEL CORONAVIRUS, NAA: SARS-CoV-2, NAA: NOT DETECTED

## 2019-04-10 ENCOUNTER — Other Ambulatory Visit: Payer: Self-pay | Admitting: Cardiology

## 2019-04-11 ENCOUNTER — Telehealth: Payer: Self-pay | Admitting: Emergency Medicine

## 2019-04-11 NOTE — Telephone Encounter (Signed)
Reviewed negative covid19 results with patient. No questions asked.  

## 2019-05-02 ENCOUNTER — Other Ambulatory Visit: Payer: Self-pay

## 2019-05-02 ENCOUNTER — Encounter (HOSPITAL_COMMUNITY): Payer: Self-pay

## 2019-05-02 ENCOUNTER — Emergency Department (HOSPITAL_COMMUNITY)
Admission: EM | Admit: 2019-05-02 | Discharge: 2019-05-02 | Disposition: A | Discharge status: Home / Self Care | Payer: BC Managed Care – PPO | Attending: Emergency Medicine | Admitting: Emergency Medicine

## 2019-05-02 DIAGNOSIS — M549 Dorsalgia, unspecified: Secondary | ICD-10-CM | POA: Diagnosis present

## 2019-05-02 DIAGNOSIS — Z5321 Procedure and treatment not carried out due to patient leaving prior to being seen by health care provider: Secondary | ICD-10-CM | POA: Insufficient documentation
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
## 2019-05-02 NOTE — ED Notes (Signed)
Called from lobby x2 to update v/s with no answer. 

## 2019-05-03 ENCOUNTER — Other Ambulatory Visit: Payer: Self-pay

## 2019-05-03 MED ORDER — METOPROLOL SUCCINATE ER 25 MG PO TB24: 12.5000 mg | ORAL_TABLET | Freq: Every day | ORAL | 1 refills | Status: DC

## 2019-05-17 ENCOUNTER — Other Ambulatory Visit: Payer: Self-pay | Admitting: Nurse Practitioner

## 2019-05-17 ENCOUNTER — Other Ambulatory Visit: Payer: Self-pay

## 2019-05-17 DIAGNOSIS — K2970 Gastritis, unspecified, without bleeding: Secondary | ICD-10-CM

## 2019-05-17 MED ORDER — PANTOPRAZOLE SODIUM 40 MG PO TBEC: 40.0000 mg | DELAYED_RELEASE_TABLET | Freq: Every day | ORAL | 2 refills | Status: DC

## 2019-06-22 ENCOUNTER — Other Ambulatory Visit: Payer: Self-pay

## 2019-06-22 MED ORDER — IVABRADINE HCL 5 MG PO TABS: 5.0000 mg | ORAL_TABLET | Freq: Two times a day (BID) | ORAL | 2 refills | Status: DC

## 2019-07-13 ENCOUNTER — Telehealth: Payer: Self-pay

## 2019-07-13 NOTE — Telephone Encounter (Signed)
    PA completed. Waiting on approval or denial.

## 2019-07-20 ENCOUNTER — Ambulatory Visit: Payer: BC Managed Care – PPO | Attending: Internal Medicine

## 2019-07-20 DIAGNOSIS — Z20822 Contact with and (suspected) exposure to covid-19: Secondary | ICD-10-CM

## 2019-07-21 LAB — NOVEL CORONAVIRUS, NAA: SARS-CoV-2, NAA: NOT DETECTED

## 2019-07-28 ENCOUNTER — Other Ambulatory Visit: Payer: Self-pay | Admitting: Cardiology

## 2019-07-28 ENCOUNTER — Other Ambulatory Visit: Payer: Self-pay | Admitting: Nurse Practitioner

## 2019-07-28 DIAGNOSIS — K2970 Gastritis, unspecified, without bleeding: Secondary | ICD-10-CM

## 2019-08-19 ENCOUNTER — Encounter: Payer: Self-pay | Admitting: Cardiology

## 2019-08-19 ENCOUNTER — Other Ambulatory Visit: Payer: Self-pay

## 2019-08-19 ENCOUNTER — Ambulatory Visit: Payer: BC Managed Care – PPO | Admitting: Cardiology

## 2019-08-19 VITALS — BP 122/78 | HR 72 | Temp 97.6°F | Ht 60.0 in | Wt 176.0 lb

## 2019-08-19 DIAGNOSIS — I5022 Chronic systolic (congestive) heart failure: Secondary | ICD-10-CM

## 2019-08-19 MED ORDER — FUROSEMIDE 20 MG PO TABS: 20.0000 mg | ORAL_TABLET | ORAL | 3 refills | Status: DC | PRN

## 2019-08-19 MED ORDER — METOPROLOL SUCCINATE ER 25 MG PO TB24: 25.0000 mg | ORAL_TABLET | Freq: Every day | ORAL | 3 refills | Status: DC

## 2019-08-19 MED ORDER — SPIRONOLACTONE 25 MG PO TABS: 25.0000 mg | ORAL_TABLET | Freq: Every day | ORAL | 0 refills | Status: DC

## 2019-08-19 MED ORDER — ENTRESTO 49-51 MG PO TABS: 1.0000 | ORAL_TABLET | Freq: Two times a day (BID) | ORAL | 3 refills | Status: DC

## 2019-08-19 NOTE — Progress Notes (Signed)
Patient is here for follow up visit.  Subjective:   @Patient  ID: Tina Colon, female    DOB: [birth year], 39 y.o.   MRN: [id number]   Chief Complaint  Patient presents with  . Chronic Systolic Heart Failure    6 month follow up    HPI   39 year old Caucasian female with nonischemic dilated cardiomyopathy, now with recovered LVEF.  She has been doing well. She denies chest pain, shortness of breath, palpitations, leg edema, orthopnea, PND, TIA/syncope. She has been out of corlanor last 3 weeks, and states that the price has gone up.   Current Outpatient Medications on File Prior to Visit  Medication Sig Dispense Refill  . ENTRESTO 49-51 MG Take 1 tablet by mouth twice daily 300 tablet 3  . folic acid (FOLVITE) 1 MG tablet Take 1 tablet (1 mg total) by mouth daily.    . furosemide (LASIX) 20 MG tablet Take 1 tablet (20 mg total) by mouth daily. (Patient taking differently: Take 20 mg by mouth as needed. ) 30 tablet 0  . ivabradine (CORLANOR) 5 MG TABS tablet Take 1 tablet (5 mg total) by mouth 2 (two) times daily. 180 tablet 2  . metoprolol succinate (TOPROL-XL) 25 MG 24 hr tablet Take 0.5 tablets (12.5 mg total) by mouth daily. 90 tablet 1  . norgestimate-ethinyl estradiol (ORTHO-CYCLEN) 0.25-35 MG-MCG tablet Take 1 tablet by mouth daily.    . pantoprazole (PROTONIX) 40 MG tablet Take 1 tablet by mouth once daily 30 tablet 2  . spironolactone (ALDACTONE) 25 MG tablet Take 1 tablet by mouth once daily 90 tablet 0  . thiamine (VITAMIN B-1) 100 MG tablet Take 100 mg by mouth daily.     No current facility-administered medications on file prior to visit.    Cardiovascular studies:   EKG 08/19/2019: Sinus rhythm 70 bpm. Nonspecific T wave inversion inferior lead.  Cardiac MRI 07/23/2018: 1.  Mild left ventricular enlargement with borderline LVEF 50%. 2. There is no late gadolinium enhancement in the left ventricular myocardium. 3.  Normal right ventricular chamber size and  function, RVEF 56%.  Echocardiogram 05/19/2018: 1. Left ventricle cavity is mildly dilated. Severe decrease in global wall motion. Visual EF is 20-25%. 2. Mild (Grade I) mitral regurgitation. 3. Mild tricuspid regurgitation. No evidence of pulmonary Hypertension. Heart failure with reduced ejection fraction, NYHA class II (I50.20)    Hospital echocardiogram 12/24/2017: Study Conclusions: - Left ventricle: The cavity size was moderately to severely dilated. Systolic function was severely reduced. The estimated ejection fraction was 15-20%. Diffuse hypokinesis. Profound   diffuse hypokinesis throughout, with near akinesis of septal and anterior walls. - Aortic valve: Transvalvular velocity was within the normal range. There was no stenosis. There was no regurgitation. - Mitral valve: There was mild regurgitation. - Left atrium: The atrium was moderately to severely dilated. - Right ventricle: The cavity size was mildly dilated. Wall   thickness was normal. Systolic function was normal. - Tricuspid valve: There was trivial regurgitation. - Pulmonic valve: There was no significant regurgitation. - Pericardium, extracardiac: A trivial pericardial effusion was   identified posterior to the heart.  Impressions: - Severely reduced ejection fraction of 15-20%. Global severe hypokinesis with near akinesis of anterior and septal walls. Severely dilated left atrium, mild MR. No significant TR to estimate filling pressures.  CTA coronary 03/23/2018: Calcium score 0.  No evidence of CAD.  Recent Labs:  02/07/2019: Glucose 88, BUN/Cr 10/0.77. EGFR >90. Na/K 137/4.0.  H/H 14/40. MCV 88.  Platelets 269   Review of Systems  Cardiovascular: Negative for chest pain, dyspnea on exertion, leg swelling, palpitations and syncope.       Objective:    Vitals:   08/19/19 0829  BP: 122/78  Pulse: 72  Temp: 97.6 F (36.4 C)  SpO2: 98%     Physical Exam  Constitutional: She appears well-developed and  well-nourished.  Neck: No JVD present.  Cardiovascular: Normal rate, regular rhythm, normal heart sounds and intact distal pulses.  No murmur heard. Pulmonary/Chest: Effort normal and breath sounds normal. She has no wheezes. She has no rales.  Musculoskeletal:        General: No edema.  Nursing note and vitals reviewed.       Assessment & Recommendations:   39 year old Caucasian female with nonischemic dilated cardiomyopathy, now with recovered LVEF   1. Chronic systolic heart failure (HCC) Nonischemic cardiomyopathy. Well compensated. EF recovered to 50% on cardiac MRI 07/2018. Continue Entresto 49-51 mg bid, spironolactone to 25 mg daily, metoprolol succinate 25 mg daily. Corlanor stopped. She is on oral contraceptive therapy. It remains crucial that she avoids pregnancy, given the teratogenic risks associated with the medications.   F/u in 1 year. BMP prior to discharge.  Nigel Mormon, MD [facility] [place] Pager: [phone] Office: [phone] If no answer Cell [phone]

## 2019-09-21 ENCOUNTER — Encounter (HOSPITAL_BASED_OUTPATIENT_CLINIC_OR_DEPARTMENT_OTHER): Payer: Self-pay

## 2019-09-21 ENCOUNTER — Other Ambulatory Visit: Payer: Self-pay

## 2019-09-21 ENCOUNTER — Emergency Department (HOSPITAL_BASED_OUTPATIENT_CLINIC_OR_DEPARTMENT_OTHER)
Admission: EM | Admit: 2019-09-21 | Discharge: 2019-09-21 | Disposition: A | Discharge status: Home / Self Care | Payer: BC Managed Care – PPO | Source: Home / Self Care | Attending: Emergency Medicine | Admitting: Emergency Medicine

## 2019-09-21 DIAGNOSIS — R112 Nausea with vomiting, unspecified: Secondary | ICD-10-CM

## 2019-09-21 DIAGNOSIS — Z87891 Personal history of nicotine dependence: Secondary | ICD-10-CM | POA: Insufficient documentation

## 2019-09-21 DIAGNOSIS — Z79899 Other long term (current) drug therapy: Secondary | ICD-10-CM | POA: Insufficient documentation

## 2019-09-21 DIAGNOSIS — I5022 Chronic systolic (congestive) heart failure: Secondary | ICD-10-CM | POA: Insufficient documentation

## 2019-09-21 LAB — LIPASE, BLOOD: Lipase: 23 U/L (ref 11–51)

## 2019-09-21 LAB — COMPREHENSIVE METABOLIC PANEL
ALT: 21 U/L (ref 0–44)
AST: 29 U/L (ref 15–41)
Albumin: 4.5 g/dL (ref 3.5–5.0)
Alkaline Phosphatase: 42 U/L (ref 38–126)
Anion gap: 15 (ref 5–15)
BUN: 9 mg/dL (ref 6–20)
CO2: 23 mmol/L (ref 22–32)
Calcium: 9.7 mg/dL (ref 8.9–10.3)
Chloride: 102 mmol/L (ref 98–111)
Creatinine, Ser: 0.7 mg/dL (ref 0.44–1.00)
GFR calc Af Amer: >60 mL/min (ref >60)
GFR calc non Af Amer: >60 mL/min (ref >60)
Glucose, Bld: 180 mg/dL — ABNORMAL HIGH (ref 70–99)
Potassium: 3.1 mmol/L — ABNORMAL LOW (ref 3.5–5.1)
Sodium: 140 mmol/L (ref 135–145)
Total Bilirubin: 0.3 mg/dL (ref 0.3–1.2)
Total Protein: 7.9 g/dL (ref 6.5–8.1)

## 2019-09-21 LAB — URINALYSIS, ROUTINE W REFLEX MICROSCOPIC
Bilirubin Urine: NEGATIVE
Glucose, UA: NEGATIVE mg/dL
Ketones, ur: >80 mg/dL — AB
Leukocytes,Ua: NEGATIVE
Nitrite: NEGATIVE
Protein, ur: 30 mg/dL — AB
Specific Gravity, Urine: 1.025 (ref 1.005–1.030)
pH: 8 (ref 5.0–8.0)

## 2019-09-21 LAB — CBC
HCT: 41.2 % (ref 36.0–46.0)
Hemoglobin: 14.1 g/dL (ref 12.0–15.0)
MCH: 31.1 pg (ref 26.0–34.0)
MCHC: 34.2 g/dL (ref 30.0–36.0)
MCV: 90.7 fL (ref 80.0–100.0)
Platelets: 247 10*3/uL (ref 150–400)
RBC: 4.54 MIL/uL (ref 3.87–5.11)
RDW: 12.7 % (ref 11.5–15.5)
WBC: 11.2 10*3/uL — ABNORMAL HIGH (ref 4.0–10.5)
nRBC: 0 % (ref 0.0–0.2)

## 2019-09-21 LAB — URINALYSIS, MICROSCOPIC (REFLEX)

## 2019-09-21 LAB — PREGNANCY, URINE: Preg Test, Ur: NEGATIVE

## 2019-09-21 LAB — MAGNESIUM: Magnesium: 1.6 mg/dL — ABNORMAL LOW (ref 1.7–2.4)

## 2019-09-21 MED ORDER — HALOPERIDOL LACTATE 5 MG/ML IJ SOLN
10.0000 mg | Freq: Once | INTRAMUSCULAR | Status: AC
  Administered 2019-09-21: 20:00:00 10 mg via INTRAMUSCULAR
  Filled 2019-09-21: qty 2

## 2019-09-21 MED ORDER — ONDANSETRON HCL 4 MG/2ML IJ SOLN: 4.0000 mg | Freq: Once | INTRAMUSCULAR | Status: DC | PRN

## 2019-09-21 MED ORDER — POTASSIUM CHLORIDE CRYS ER 20 MEQ PO TBCR: 20.0000 meq | EXTENDED_RELEASE_TABLET | Freq: Every day | ORAL | 0 refills | Status: DC

## 2019-09-21 MED ORDER — ONDANSETRON HCL 4 MG/2ML IJ SOLN: 4.0000 mg | Freq: Once | INTRAMUSCULAR | Status: DC

## 2019-09-21 MED ORDER — ONDANSETRON 4 MG PO TBDP: ORAL_TABLET | ORAL | 0 refills | Status: DC

## 2019-09-21 MED ORDER — ONDANSETRON 4 MG PO TBDP
ORAL_TABLET | ORAL | Status: AC
  Filled 2019-09-21: qty 1

## 2019-09-21 MED ORDER — ONDANSETRON 4 MG PO TBDP
4.0000 mg | ORAL_TABLET | Freq: Once | ORAL | Status: AC
  Administered 2019-09-21: 20:00:00 4 mg via ORAL

## 2019-09-21 MED ORDER — MAGNESIUM OXIDE 400 MG PO TABS: 400.0000 mg | ORAL_TABLET | Freq: Two times a day (BID) | ORAL | 0 refills | Status: DC

## 2019-09-21 NOTE — Discharge Instructions (Addendum)
Please follow with your primary care doctor in the next 2 days for a check-up. They must obtain records for further management.  ° °Do not hesitate to return to the Emergency Department for any new, worsening or concerning symptoms.  ° °

## 2019-09-21 NOTE — ED Triage Notes (Signed)
Pt c/o RUQ pain n/v/d since 12pm today-states hx of same with no dx-to triage in w/c

## 2019-09-21 NOTE — ED Notes (Signed)
Attempted IV start x 2 with no success. [place] IV requested. Provider aware.

## 2019-09-21 NOTE — ED Provider Notes (Signed)
[facility] EMERGENCY DEPARTMENT Provider Note   CSN: [id number] Arrival date & time: 09/21/19  1847     History Chief Complaint  Patient presents with  . Abdominal Pain     HPI    Blood pressure 140/86, pulse (!) 57, temperature 98.3 F (36.8 C), temperature source Oral, resp. rate 19, height 5' (1.524 m), weight 79.8 kg, last menstrual period 08/21/2019, SpO2 100 %.  BRIA SPARR is a 39 y.o. female complaining of upper abdominal pain with multiple episodes of nausea and vomiting onset this morning.  She denies any fevers, chills, diarrhea, sick contacts.  She sees GI for this, states she has episodes like this periodically.  She takes Hycosamine    Past Medical History:  Diagnosis Date  . Cardiomyopathy (HCC)   . ETOH abuse   . Gastritis   . GERD (gastroesophageal reflux disease)   . Heart failure (HCC)   . Pancreatitis     Patient Active Problem List   Diagnosis Date Noted  . Chronic systolic heart failure (HCC) 08/12/2018  . Bilateral pleural effusion 12/23/2017  . Tachycardia 12/23/2017  . Hypokalemia 12/23/2017  . CHF (congestive heart failure) (HCC) 12/23/2017  . Routine general medical examination at a health care facility 10/02/2017  . Gastritis 06/07/2015  . Alcohol abuse 06/07/2015  . Cannabis abuse 06/07/2015    Past Surgical History:  Procedure Laterality Date  . ESOPHAGOGASTRODUODENOSCOPY  06/19/2015    Dr Lavon Paganini  . WISDOM TOOTH EXTRACTION       OB History   No obstetric history on file.     Family History  Problem Relation Age of Onset  . Diabetes Mother   . Hyperlipidemia Mother   . Hypertension Father   . Gout Father   . Stomach cancer Maternal Grandfather        York Spaniel that his siblings died with cancer also but not sure what kind  . Diabetes Other   . Heart disease Other   . Irritable bowel syndrome Other   . Gallstones Sister        Had her gallbladder removed   . Gallstones Paternal Aunt        said a few on  her dad's side     Social History   Tobacco Use  . Smoking status: Former Smoker    Packs/day: 0.50    Years: 10.00    Pack years: 5.00    Types: Cigarettes    Quit date: 2011    Years since quitting: 10.2  . Smokeless tobacco: Never Used  Substance Use Topics  . Alcohol use: Yes    Comment: occ  . Drug use: Yes    Types: Marijuana    Comment: last smoked 09/20/2019    Home Medications Prior to Admission medications   Medication Sig Start Date End Date Taking? Authorizing Provider  folic acid (FOLVITE) 1 MG tablet Take 1 tablet (1 mg total) by mouth daily. 12/26/17   Alwyn Ren, MD  furosemide (LASIX) 20 MG tablet Take 1 tablet (20 mg total) by mouth as needed for edema. 08/19/19   Patwardhan, Anabel Bene, MD  Hyoscyamine Sulfate SL 0.125 MG SUBL as needed.    [provider]  magnesium oxide (MAG-OX) 400 MG tablet Take 1 tablet (400 mg total) by mouth 2 (two) times daily. 09/21/19   Geraldine Sandberg, Joni Reining, PA-C  metoprolol succinate (TOPROL-XL) 25 MG 24 hr tablet Take 1 tablet (25 mg total) by mouth daily. 08/19/19   Patwardhan, [employer]  J, MD  norgestimate-ethinyl estradiol (ORTHO-CYCLEN) 0.25-35 MG-MCG tablet Take 1 tablet by mouth daily.    [provider]  ondansetron (ZOFRAN ODT) 4 MG disintegrating tablet 4mg  ODT q4 hours prn nausea/vomit 09/21/19   Watt Geiler, 09/23/19, PA-C  pantoprazole (PROTONIX) 40 MG tablet Take 1 tablet by mouth once daily 07/28/19   07/30/19, NP  potassium chloride SA (KLOR-CON) 20 MEQ tablet Take 1 tablet (20 mEq total) by mouth daily. 09/21/19   Torian Thoennes, 09/23/19, PA-C  sacubitril-valsartan (ENTRESTO) 49-51 MG Take 1 tablet by mouth 2 (two) times daily. 08/19/19   Patwardhan, 10/19/19, MD  spironolactone (ALDACTONE) 25 MG tablet Take 1 tablet (25 mg total) by mouth daily. 08/19/19   Patwardhan, 10/19/19, MD  thiamine (VITAMIN B-1) 100 MG tablet Take 100 mg by mouth daily.    [provider]    Allergies     Penicillins  Review of Systems   Review of Systems   A complete review of systems was obtained and all systems are negative except as noted in the HPI and PMH.    Physical Exam Updated Vital Signs BP 140/86 (BP Location: Right Arm)   Pulse (!) 57   Temp 98.3 F (36.8 C) (Oral)   Resp 19   Ht 5' (1.524 m)   Wt 79.8 kg   LMP 08/21/2019 (Approximate)   SpO2 100%   BMI 34.37 kg/m   Physical Exam Vitals and nursing note reviewed.  Constitutional:      General: She is not in acute distress.    Appearance: She is not diaphoretic.     Comments: Appears uncomfortable  HENT:     Head: Normocephalic and atraumatic.  Eyes:     Conjunctiva/sclera: Conjunctivae normal.     Pupils: Pupils are equal, round, and reactive to light.  Cardiovascular:     Rate and Rhythm: Normal rate and regular rhythm.  Pulmonary:     Effort: Pulmonary effort is normal.     Breath sounds: Normal breath sounds.  Abdominal:     Palpations: Abdomen is soft.     Tenderness: There is no abdominal tenderness.     Comments: Mild, diffuse tenderness to palpation with no guarding or rebound.  Murphy sign negative, no tenderness to palpation over McBurney's point, Rovsings, Psoas and obturator all negative.   Musculoskeletal:        General: Normal range of motion.     Cervical back: Normal range of motion.  Neurological:     Mental Status: She is alert and oriented to person, place, and time.     ED Results / Procedures / Treatments   Labs (all labs ordered are listed, but only abnormal results are displayed) Labs Reviewed  COMPREHENSIVE METABOLIC PANEL - Abnormal; Notable for the following components:      Result Value   Potassium 3.1 (*)    Glucose, Bld 180 (*)    All other components within normal limits  CBC - Abnormal; Notable for the following components:   WBC 11.2 (*)    All other components within normal limits  URINALYSIS, ROUTINE W REFLEX MICROSCOPIC - Abnormal; Notable for the  following components:   APPearance CLOUDY (*)    Hgb urine dipstick TRACE (*)    Ketones, ur >80 (*)    Protein, ur 30 (*)    All other components within normal limits  MAGNESIUM - Abnormal; Notable for the following components:   Magnesium 1.6 (*)    All other components within  normal limits  URINALYSIS, MICROSCOPIC (REFLEX) - Abnormal; Notable for the following components:   Bacteria, UA RARE (*)    All other components within normal limits  LIPASE, BLOOD  PREGNANCY, URINE    EKG None  Radiology No results found.  Procedures Procedures (including critical care time)  Medications Ordered in ED Medications  ondansetron (ZOFRAN) injection 4 mg (has no administration in time range)  ondansetron (ZOFRAN-ODT) disintegrating tablet 4 mg (4 mg Oral Given 09/21/19 1933)  haloperidol lactate (HALDOL) injection 10 mg (10 mg Intramuscular Given 09/21/19 1957)    ED Course  I have reviewed the triage vital signs and the nursing notes.  Pertinent labs & imaging results that were available during my care of the patient were reviewed by me and considered in my medical decision making (see chart for details).    MDM Rules/Calculators/A&P                      Vitals:   09/21/19 1854 09/21/19 2131  BP: (!) 157/82 140/86  Pulse: 65 (!) 57  Resp: 18 19  Temp: 98.3 F (36.8 C)   TempSrc: Oral   SpO2: 100% 100%  Weight: 79.8 kg   Height: 5' (1.524 m)     Medications  ondansetron (ZOFRAN) injection 4 mg (has no administration in time range)  ondansetron (ZOFRAN-ODT) disintegrating tablet 4 mg (4 mg Oral Given 09/21/19 1933)  haloperidol lactate (HALDOL) injection 10 mg (10 mg Intramuscular Given 09/21/19 1957)    MILEENA ROTHENBERGER is 39 y.o. female presenting with abdominal pain and emesis onset just prior to arrival, she appears uncomfortable however her abdominal exam is nonfocal it appears that she has had multiple episodes like this in the past.  States that her GI doctor cannot  find a cause, history of cannabis use.  We have had difficulty obtaining an IV, IM Haldol given.  Mildly depleted magnesium and potassium.  Patient feels much better after Haldol, repeat abdominal exam is nonsurgical, she is much more comfortable in general.  She is passed p.o. challenge and is amenable to discharge.  Evaluation does not show pathology that would require ongoing emergent intervention or inpatient treatment. Pt is hemodynamically stable and mentating appropriately. Discussed findings and plan with patient/guardian, who agrees with care plan. All questions answered. Return precautions discussed and outpatient follow up given.     Final Clinical Impression(s) / ED Diagnoses Final diagnoses:  Nausea and vomiting, intractability of vomiting not specified, unspecified vomiting type    Rx / DC Orders ED Discharge Orders         Ordered    magnesium oxide (MAG-OX) 400 MG tablet  2 times daily     09/21/19 2254    potassium chloride SA (KLOR-CON) 20 MEQ tablet  Daily     09/21/19 2254    ondansetron (ZOFRAN ODT) 4 MG disintegrating tablet     09/21/19 2317           Tim Corriher, Charna Elizabeth 09/21/19 Spencer, [place], DO 09/21/19 2324

## 2019-09-23 ENCOUNTER — Inpatient Hospital Stay (HOSPITAL_BASED_OUTPATIENT_CLINIC_OR_DEPARTMENT_OTHER): Payer: BC Managed Care – PPO

## 2019-09-23 ENCOUNTER — Encounter (HOSPITAL_BASED_OUTPATIENT_CLINIC_OR_DEPARTMENT_OTHER): Payer: Self-pay | Admitting: *Deleted

## 2019-09-23 ENCOUNTER — Emergency Department (HOSPITAL_BASED_OUTPATIENT_CLINIC_OR_DEPARTMENT_OTHER): Payer: BC Managed Care – PPO

## 2019-09-23 ENCOUNTER — Inpatient Hospital Stay (HOSPITAL_BASED_OUTPATIENT_CLINIC_OR_DEPARTMENT_OTHER)
Admission: EM | Admit: 2019-09-23 | Discharge: 2019-09-27 | DRG: 287 | Disposition: A | Discharge status: Home / Self Care | Payer: BC Managed Care – PPO | Attending: Internal Medicine | Admitting: Internal Medicine

## 2019-09-23 ENCOUNTER — Other Ambulatory Visit: Payer: Self-pay

## 2019-09-23 DIAGNOSIS — Z8719 Personal history of other diseases of the digestive system: Secondary | ICD-10-CM

## 2019-09-23 DIAGNOSIS — Z8349 Family history of other endocrine, nutritional and metabolic diseases: Secondary | ICD-10-CM

## 2019-09-23 DIAGNOSIS — D72829 Elevated white blood cell count, unspecified: Secondary | ICD-10-CM | POA: Diagnosis present

## 2019-09-23 DIAGNOSIS — Z8679 Personal history of other diseases of the circulatory system: Secondary | ICD-10-CM

## 2019-09-23 DIAGNOSIS — E669 Obesity, unspecified: Secondary | ICD-10-CM | POA: Diagnosis present

## 2019-09-23 DIAGNOSIS — Z87891 Personal history of nicotine dependence: Secondary | ICD-10-CM

## 2019-09-23 DIAGNOSIS — R9431 Abnormal electrocardiogram [ECG] [EKG]: Secondary | ICD-10-CM | POA: Diagnosis present

## 2019-09-23 DIAGNOSIS — F419 Anxiety disorder, unspecified: Secondary | ICD-10-CM | POA: Diagnosis present

## 2019-09-23 DIAGNOSIS — F1290 Cannabis use, unspecified, uncomplicated: Secondary | ICD-10-CM | POA: Diagnosis present

## 2019-09-23 DIAGNOSIS — R112 Nausea with vomiting, unspecified: Secondary | ICD-10-CM | POA: Diagnosis present

## 2019-09-23 DIAGNOSIS — I5022 Chronic systolic (congestive) heart failure: Secondary | ICD-10-CM | POA: Diagnosis present

## 2019-09-23 DIAGNOSIS — Z7289 Other problems related to lifestyle: Secondary | ICD-10-CM | POA: Diagnosis not present

## 2019-09-23 DIAGNOSIS — Z8249 Family history of ischemic heart disease and other diseases of the circulatory system: Secondary | ICD-10-CM | POA: Diagnosis not present

## 2019-09-23 DIAGNOSIS — Z20822 Contact with and (suspected) exposure to covid-19: Secondary | ICD-10-CM | POA: Diagnosis present

## 2019-09-23 DIAGNOSIS — R1013 Epigastric pain: Secondary | ICD-10-CM

## 2019-09-23 DIAGNOSIS — E6609 Other obesity due to excess calories: Secondary | ICD-10-CM

## 2019-09-23 DIAGNOSIS — F1020 Alcohol dependence, uncomplicated: Secondary | ICD-10-CM | POA: Diagnosis present

## 2019-09-23 DIAGNOSIS — Z79899 Other long term (current) drug therapy: Secondary | ICD-10-CM | POA: Diagnosis not present

## 2019-09-23 DIAGNOSIS — Z789 Other specified health status: Secondary | ICD-10-CM

## 2019-09-23 DIAGNOSIS — I428 Other cardiomyopathies: Secondary | ICD-10-CM | POA: Diagnosis present

## 2019-09-23 DIAGNOSIS — I493 Ventricular premature depolarization: Secondary | ICD-10-CM | POA: Diagnosis present

## 2019-09-23 DIAGNOSIS — Z833 Family history of diabetes mellitus: Secondary | ICD-10-CM | POA: Diagnosis not present

## 2019-09-23 DIAGNOSIS — Z880 Allergy status to penicillin: Secondary | ICD-10-CM | POA: Diagnosis not present

## 2019-09-23 DIAGNOSIS — Z6833 Body mass index (BMI) 33.0-33.9, adult: Secondary | ICD-10-CM | POA: Diagnosis not present

## 2019-09-23 DIAGNOSIS — I5023 Acute on chronic systolic (congestive) heart failure: Secondary | ICD-10-CM | POA: Diagnosis present

## 2019-09-23 DIAGNOSIS — K219 Gastro-esophageal reflux disease without esophagitis: Secondary | ICD-10-CM | POA: Diagnosis present

## 2019-09-23 DIAGNOSIS — I214 Non-ST elevation (NSTEMI) myocardial infarction: Secondary | ICD-10-CM | POA: Diagnosis not present

## 2019-09-23 DIAGNOSIS — I248 Other forms of acute ischemic heart disease: Secondary | ICD-10-CM | POA: Diagnosis present

## 2019-09-23 DIAGNOSIS — R110 Nausea: Secondary | ICD-10-CM | POA: Diagnosis present

## 2019-09-23 DIAGNOSIS — E876 Hypokalemia: Secondary | ICD-10-CM | POA: Diagnosis present

## 2019-09-23 HISTORY — DX: Heart failure, unspecified: I50.9

## 2019-09-23 LAB — URINALYSIS, ROUTINE W REFLEX MICROSCOPIC
Glucose, UA: NEGATIVE mg/dL
Ketones, ur: 40 mg/dL — AB
Leukocytes,Ua: NEGATIVE
Nitrite: NEGATIVE
Protein, ur: >300 mg/dL — AB
Specific Gravity, Urine: >1.03 — ABNORMAL HIGH (ref 1.005–1.030)
pH: 6 (ref 5.0–8.0)

## 2019-09-23 LAB — COMPREHENSIVE METABOLIC PANEL WITH GFR
ALT: 22 U/L (ref 0–44)
AST: 26 U/L (ref 15–41)
Albumin: 4.4 g/dL (ref 3.5–5.0)
Alkaline Phosphatase: 43 U/L (ref 38–126)
Anion gap: 16 — ABNORMAL HIGH (ref 5–15)
BUN: 15 mg/dL (ref 6–20)
CO2: 22 mmol/L (ref 22–32)
Calcium: 9.9 mg/dL (ref 8.9–10.3)
Chloride: 100 mmol/L (ref 98–111)
Creatinine, Ser: 0.83 mg/dL (ref 0.44–1.00)
GFR calc Af Amer: >60 mL/min
GFR calc non Af Amer: >60 mL/min
Glucose, Bld: 142 mg/dL — ABNORMAL HIGH (ref 70–99)
Potassium: 2.6 mmol/L — CL (ref 3.5–5.1)
Sodium: 138 mmol/L (ref 135–145)
Total Bilirubin: 0.4 mg/dL (ref 0.3–1.2)
Total Protein: 8 g/dL (ref 6.5–8.1)

## 2019-09-23 LAB — URINALYSIS, MICROSCOPIC (REFLEX)

## 2019-09-23 LAB — CBC
HCT: 42 % (ref 36.0–46.0)
Hemoglobin: 14.4 g/dL (ref 12.0–15.0)
MCH: 30.5 pg (ref 26.0–34.0)
MCHC: 34.3 g/dL (ref 30.0–36.0)
MCV: 89 fL (ref 80.0–100.0)
Platelets: 308 10*3/uL (ref 150–400)
RBC: 4.72 MIL/uL (ref 3.87–5.11)
RDW: 12.7 % (ref 11.5–15.5)
WBC: 12.7 10*3/uL — ABNORMAL HIGH (ref 4.0–10.5)
nRBC: 0 % (ref 0.0–0.2)

## 2019-09-23 LAB — TROPONIN I (HIGH SENSITIVITY)
Troponin I (High Sensitivity): 11 ng/L (ref <18)
Troponin I (High Sensitivity): 1067 ng/L (ref <18)

## 2019-09-23 LAB — MAGNESIUM: Magnesium: 2 mg/dL (ref 1.7–2.4)

## 2019-09-23 LAB — PREGNANCY, URINE: Preg Test, Ur: NEGATIVE

## 2019-09-23 LAB — LIPASE, BLOOD: Lipase: 32 U/L (ref 11–51)

## 2019-09-23 LAB — BRAIN NATRIURETIC PEPTIDE: B Natriuretic Peptide: 101.2 pg/mL — ABNORMAL HIGH (ref 0.0–100.0)

## 2019-09-23 MED ORDER — HEPARIN BOLUS VIA INFUSION
3800.0000 [IU] | Freq: Once | INTRAVENOUS | Status: AC
  Administered 2019-09-23: 18:00:00 3800 [IU] via INTRAVENOUS

## 2019-09-23 MED ORDER — DIPHENHYDRAMINE HCL 50 MG/ML IJ SOLN
25.0000 mg | Freq: Once | INTRAMUSCULAR | Status: AC
  Administered 2019-09-23: 25 mg via INTRAVENOUS
  Filled 2019-09-23: qty 1

## 2019-09-23 MED ORDER — ONDANSETRON 4 MG PO TBDP
4.0000 mg | ORAL_TABLET | Freq: Once | ORAL | Status: AC
  Administered 2019-09-23: 4 mg via ORAL
  Filled 2019-09-23: qty 1

## 2019-09-23 MED ORDER — POTASSIUM CHLORIDE 10 MEQ/100ML IV SOLN
10.0000 meq | INTRAVENOUS | Status: AC
  Administered 2019-09-23 (×2): 10 meq via INTRAVENOUS
  Filled 2019-09-23 (×2): qty 100

## 2019-09-23 MED ORDER — SODIUM CHLORIDE 0.9 % IV BOLUS
1000.0000 mL | Freq: Once | INTRAVENOUS | Status: AC
  Administered 2019-09-23: 1000 mL via INTRAVENOUS

## 2019-09-23 MED ORDER — CAPSAICIN 0.025 % EX CREA
TOPICAL_CREAM | Freq: Once | CUTANEOUS | Status: DC
  Filled 2019-09-23: qty 60

## 2019-09-23 MED ORDER — ALUM & MAG HYDROXIDE-SIMETH 200-200-20 MG/5ML PO SUSP
30.0000 mL | Freq: Once | ORAL | Status: AC
  Administered 2019-09-23: 30 mL via ORAL
  Filled 2019-09-23: qty 30

## 2019-09-23 MED ORDER — HALOPERIDOL LACTATE 5 MG/ML IJ SOLN
10.0000 mg | Freq: Once | INTRAMUSCULAR | Status: AC
  Administered 2019-09-23: 13:00:00 10 mg via INTRAVENOUS
  Filled 2019-09-23: qty 2

## 2019-09-23 MED ORDER — MORPHINE SULFATE (PF) 4 MG/ML IV SOLN
4.0000 mg | Freq: Once | INTRAVENOUS | Status: AC
  Administered 2019-09-23: 4 mg via INTRAVENOUS
  Filled 2019-09-23: qty 1

## 2019-09-23 MED ORDER — IOHEXOL 350 MG/ML SOLN
100.0000 mL | Freq: Once | INTRAVENOUS | Status: AC | PRN
  Administered 2019-09-23: 74 mL via INTRAVENOUS

## 2019-09-23 MED ORDER — ALUM & MAG HYDROXIDE-SIMETH 200-200-20 MG/5ML PO SUSP
30.0000 mL | Freq: Once | ORAL | Status: AC
  Administered 2019-09-23: 12:00:00 30 mL via ORAL
  Filled 2019-09-23: qty 30

## 2019-09-23 MED ORDER — LIDOCAINE VISCOUS HCL 2 % MT SOLN
15.0000 mL | Freq: Once | OROMUCOSAL | Status: AC
  Administered 2019-09-23: 15 mL via OROMUCOSAL
  Filled 2019-09-23: qty 15

## 2019-09-23 MED ORDER — SODIUM CHLORIDE 0.9 % IV SOLN: INTRAVENOUS | Status: DC | PRN

## 2019-09-23 MED ORDER — HEPARIN (PORCINE) 25000 UT/250ML-% IV SOLN
1100.0000 [IU]/h | INTRAVENOUS | Status: DC
  Administered 2019-09-23: 18:00:00 800 [IU]/h via INTRAVENOUS
  Administered 2019-09-25: 1100 [IU]/h via INTRAVENOUS
  Filled 2019-09-23 (×4): qty 250

## 2019-09-23 NOTE — ED Notes (Signed)
Pt on monitor 

## 2019-09-23 NOTE — ED Notes (Signed)
Pt ambulated to bathroom 

## 2019-09-23 NOTE — ED Notes (Signed)
Ginger ale and crackers given.

## 2019-09-23 NOTE — ED Notes (Signed)
Pt is having dry heaves  

## 2019-09-23 NOTE — ED Provider Notes (Signed)
[facility] EMERGENCY DEPARTMENT Provider Note   CSN: [id number] Arrival date & time: 09/23/19  1127     History Chief Complaint  Patient presents with  . Abdominal Pain    Tina Colon is a 39 y.o. female with PMHx cardiomyopathy with EF 15%, GERD, gastritis who presents to the ED today with complaint of gradual onset, constant, sharp, epigastric abdominal pain that began today with associated nausea and NBNB emesis.  Chart review patient was seen in the ED 2 days ago for same.  She follows GI for this and reports that she has episodes periodically.  Patient is prescribed hyoscyamine outpatient.  Lab work obtained 2 days ago with potassium 3.1, no other acute findings.  He was treated initially with Zofran and then Haldol which improved her symptoms.  She was able to tolerate p.o. and was discharged home with close GI follow-up.  Patient states she felt fine yesterday and then today woke up with similar pain.   While patient was brought back to her room she reports she started having chest pain. No SOB. She mentions hx of CHF. Pt does have + FHx for CAD; no personal hx of MI.   The history is provided by the patient and medical records.       Past Medical History:  Diagnosis Date  . Cardiomyopathy (HCC)   . ETOH abuse   . Gastritis   . GERD (gastroesophageal reflux disease)   . Heart failure (HCC)   . Pancreatitis     Patient Active Problem List   Diagnosis Date Noted  . NSTEMI (non-ST elevated myocardial infarction) (HCC) 09/23/2019  . Chronic systolic heart failure (HCC) 08/12/2018  . Bilateral pleural effusion 12/23/2017  . Tachycardia 12/23/2017  . Hypokalemia 12/23/2017  . CHF (congestive heart failure) (HCC) 12/23/2017  . Routine general medical examination at a health care facility 10/02/2017  . Gastritis 06/07/2015  . Alcohol abuse 06/07/2015  . Cannabis abuse 06/07/2015    Past Surgical History:  Procedure Laterality Date  .  ESOPHAGOGASTRODUODENOSCOPY  06/19/2015    Dr Lavon Paganini  . WISDOM TOOTH EXTRACTION       OB History   No obstetric history on file.     Family History  Problem Relation Age of Onset  . Diabetes Mother   . Hyperlipidemia Mother   . Hypertension Father   . Gout Father   . Stomach cancer Maternal Grandfather        York Spaniel that his siblings died with cancer also but not sure what kind  . Diabetes Other   . Heart disease Other   . Irritable bowel syndrome Other   . Gallstones Sister        Had her gallbladder removed   . Gallstones Paternal Aunt        said a few on her dad's side     Social History   Tobacco Use  . Smoking status: Former Smoker    Packs/day: 0.50    Years: 10.00    Pack years: 5.00    Types: Cigarettes    Quit date: 2011    Years since quitting: 10.2  . Smokeless tobacco: Never Used  Substance Use Topics  . Alcohol use: Yes    Comment: occ  . Drug use: Yes    Types: Marijuana    Comment: last smoked 09/20/2019    Home Medications Prior to Admission medications   Medication Sig Start Date End Date Taking? Authorizing Provider  folic acid (FOLVITE) 1  MG tablet Take 1 tablet (1 mg total) by mouth daily. 12/26/17   Alwyn Ren, MD  furosemide (LASIX) 20 MG tablet Take 1 tablet (20 mg total) by mouth as needed for edema. 08/19/19   Patwardhan, Anabel Bene, MD  Hyoscyamine Sulfate SL 0.125 MG SUBL as needed.    [provider]  magnesium oxide (MAG-OX) 400 MG tablet Take 1 tablet (400 mg total) by mouth 2 (two) times daily. 09/21/19   Pisciotta, Joni Reining, PA-C  metoprolol succinate (TOPROL-XL) 25 MG 24 hr tablet Take 1 tablet (25 mg total) by mouth daily. 08/19/19   Patwardhan, Anabel Bene, MD  norgestimate-ethinyl estradiol (ORTHO-CYCLEN) 0.25-35 MG-MCG tablet Take 1 tablet by mouth daily.    [provider]  ondansetron (ZOFRAN ODT) 4 MG disintegrating tablet 4mg  ODT q4 hours prn nausea/vomit 09/21/19   Pisciotta, 09/23/19, PA-C  pantoprazole  (PROTONIX) 40 MG tablet Take 1 tablet by mouth once daily 07/28/19   07/30/19, NP  potassium chloride SA (KLOR-CON) 20 MEQ tablet Take 1 tablet (20 mEq total) by mouth daily. 09/21/19   Pisciotta, 09/23/19, PA-C  sacubitril-valsartan (ENTRESTO) 49-51 MG Take 1 tablet by mouth 2 (two) times daily. 08/19/19   Patwardhan, 10/19/19, MD  spironolactone (ALDACTONE) 25 MG tablet Take 1 tablet (25 mg total) by mouth daily. 08/19/19   Patwardhan, 10/19/19, MD  thiamine (VITAMIN B-1) 100 MG tablet Take 100 mg by mouth daily.    [provider]    Allergies    Penicillins  Review of Systems   Review of Systems  Constitutional: Negative for chills and fever.  Respiratory: Negative for cough and shortness of breath.   Cardiovascular: Positive for chest pain.  Gastrointestinal: Positive for abdominal pain, nausea and vomiting.  All other systems reviewed and are negative.   Physical Exam Updated Vital Signs BP 139/87   Pulse (!) 114   Temp 98.4 F (36.9 C) (Oral)   Resp 16   Ht 5' (1.524 m)   Wt 78 kg   LMP 09/22/2019   SpO2 100%   BMI 33.59 kg/m   Physical Exam Vitals and nursing note reviewed.  Constitutional:      Appearance: She is not diaphoretic.     Comments: Uncomfortable appearing female; actively dry heaving  HENT:     Head: Normocephalic and atraumatic.  Eyes:     Conjunctiva/sclera: Conjunctivae normal.  Cardiovascular:     Rate and Rhythm: Normal rate and regular rhythm.     Heart sounds: Normal heart sounds.     Comments: 2+ radial pulses bilaterally Pulmonary:     Effort: Pulmonary effort is normal.     Breath sounds: Normal breath sounds. No wheezing, rhonchi or rales.  Abdominal:     Palpations: Abdomen is soft.     Tenderness: There is abdominal tenderness in the epigastric area. There is no right CVA tenderness, left CVA tenderness, guarding or rebound.  Musculoskeletal:     Cervical back: Neck supple.  Skin:    General: Skin is warm and dry.    Neurological:     Mental Status: She is alert.      ED Results / Procedures / Treatments   Labs (all labs ordered are listed, but only abnormal results are displayed) Labs Reviewed  COMPREHENSIVE METABOLIC PANEL - Abnormal; Notable for the following components:      Result Value   Potassium 2.6 (*)    Glucose, Bld 142 (*)    Anion gap 16 (*)  All other components within normal limits  CBC - Abnormal; Notable for the following components:   WBC 12.7 (*)    All other components within normal limits  URINALYSIS, ROUTINE W REFLEX MICROSCOPIC - Abnormal; Notable for the following components:   Color, Urine BROWN (*)    Specific Gravity, Urine >1.030 (*)    Hgb urine dipstick LARGE (*)    Bilirubin Urine SMALL (*)    Ketones, ur 40 (*)    Protein, ur >300 (*)    All other components within normal limits  BRAIN NATRIURETIC PEPTIDE - Abnormal; Notable for the following components:   B Natriuretic Peptide 101.2 (*)    All other components within normal limits  URINALYSIS, MICROSCOPIC (REFLEX) - Abnormal; Notable for the following components:   Bacteria, UA MANY (*)    All other components within normal limits  TROPONIN I (HIGH SENSITIVITY) - Abnormal; Notable for the following components:   Troponin I (High Sensitivity) 1,067 (*)    All other components within normal limits  URINE CULTURE  SARS CORONAVIRUS 2 (TAT 6-24 HRS)  LIPASE, BLOOD  PREGNANCY, URINE  MAGNESIUM  PROTIME-INR  APTT  TROPONIN I (HIGH SENSITIVITY)    EKG EKG Interpretation  Date/Time:  Friday September 23 2019 16:54:12 EDT Ventricular Rate:  106 PR Interval:    QRS Duration: 105 QT Interval:  352 QTC Calculation: 468 R Axis:   -23 Text Interpretation: Sinus tachycardia Multiple premature complexes, vent & supraven Borderline left axis deviation Probable anteroseptal infarct, recent Baseline wander in lead(s) I III aVL Since last tracing Premature ventricular complexes NOW PRESENT Otherwise no  significant change Confirmed by Calvert Cantor [phone]) on 09/23/2019 4:56:36 PM   Radiology DG Chest Port 1 View  Result Date: 09/23/2019 CLINICAL DATA:  Chest pain. EXAM: PORTABLE CHEST 1 VIEW COMPARISON:  December 23, 2017. FINDINGS: The heart size and mediastinal contours are within normal limits. Both lungs are clear. No pneumothorax or pleural effusion is noted. The visualized skeletal structures are unremarkable. IMPRESSION: No active disease. Electronically Signed   By: Marijo Conception M.D.   On: 09/23/2019 13:55    Procedures .Critical Care Performed by: Eustaquio Maize, PA-C Authorized by: Eustaquio Maize, PA-C   Critical care provider statement:    Critical care time (minutes):  50   Critical care was necessary to treat or prevent imminent or life-threatening deterioration of the following conditions:  Cardiac failure   Critical care was time spent personally by me on the following activities:  Discussions with consultants, evaluation of patient's response to treatment, examination of patient, ordering and performing treatments and interventions, ordering and review of laboratory studies, ordering and review of radiographic studies, pulse oximetry, re-evaluation of patient's condition, obtaining history from patient or surrogate and review of old charts   (including critical care time)  Medications Ordered in ED Medications  0.9 %  sodium chloride infusion (has no administration in time range)  capsaicin (ZOSTRIX) 0.025 % cream ( Topical Not Given 09/23/19 1453)  heparin ADULT infusion 100 units/mL (25000 units/250mL sodium chloride 0.45%) (800 Units/hr Intravenous New Bag/Given 09/23/19 1730)  iohexol (OMNIPAQUE) 350 MG/ML injection 100 mL (has no administration in time range)  ondansetron (ZOFRAN-ODT) disintegrating tablet 4 mg (4 mg Oral Given 09/23/19 1153)  alum & mag hydroxide-simeth (MAALOX/MYLANTA) 200-200-20 MG/5ML suspension 30 mL (30 mLs Oral Given 09/23/19 1212)  sodium  chloride 0.9 % bolus 1,000 mL (0 mLs Intravenous Stopped 09/23/19 1604)  haloperidol lactate (HALDOL) injection 10 mg (10 mg  Intravenous Given 09/23/19 1258)  potassium chloride 10 mEq in 100 mL IVPB (0 mEq Intravenous Stopped 09/23/19 1526)  diphenhydrAMINE (BENADRYL) injection 25 mg (25 mg Intravenous Given 09/23/19 1326)  morphine 4 MG/ML injection 4 mg (4 mg Intravenous Given 09/23/19 1451)  heparin bolus via infusion 3,800 Units (3,800 Units Intravenous Bolus from Bag 09/23/19 1730)    ED Course  I have reviewed the triage vital signs and the nursing notes.  Pertinent labs & imaging results that were available during my care of the patient were reviewed by me and considered in my medical decision making (see chart for details).  Clinical Course as of Sep 22 1800  Fri Sep 23, 2019  1653 Troponin I (High Sensitivity)(!!): 1,067 [MV]    Clinical Course User Index [MV] Tanda Rockers, [place]   MDM Rules/Calculators/A&P                      39 year old female presents to the ED today with complaint of epigastric abdominal pain, nausea, nonbloody nonbilious episode.  Seen 2 days ago for same.  History of recurrent issues and is being followed by GI.  Patient reports GI has been unable to figure out why she has frequent symptoms however there is concern about marijuana use.  To the ED patient is afebrile, nontachycardic.  However she is tachypneic at 22 respirations per minute.  This is likely due to patient's dry heaving.  Will obtain screening labs at this time and reevaluate.  Was fluids initially ordered however echo in 2019 shows EF of 15%.,  Lab work was obtained while patient was in the waiting room and potassium came back at 2.6.  Small amount of fluids provided with IV potassium x2 rounds.  Given patient complaining of chest pain EKG ordered, will add troponin and BNP as well as a chest x-ray.   Provided with 10 mg Haldol IV then reported feeling "weird." Benadryl provided as well. EKG does  show prolonged qtc; will hold on anymore qtc prolonging meds.   CBC with mild leukocytosis 12.7 today. Suspect likely from dehydration  WBC  Date Value Ref Range Status  09/23/2019 12.7 (H) 4.0 - 10.5 K/uL Final  09/21/2019 11.2 (H) 4.0 - 10.5 K/uL Final  11/23/2018 11.2 (H) 4.0 - 10.5 K/uL Final  11/18/2018 9.7 4.0 - 10.5 K/uL Final   CMP again with potassium 2.6. 10 meq x 2 ordered. Glucose 142 and gap of 16. No other findings. Bicarb 22. No concern for DKA.  Lipase 32 Mag 2.0 Urine preg negative Troponin of 11 EKG without ischemic changes BNP 101.2 CXR negative  U/A with large hgb and 11-20 RBCS per hpf. Pt without urinary symptoms. Did start her menstrual cycle yesterday. Will send for culture however do not feel pt needs tx at this time  Pt continued to complain of pain despite maalox, haldol, and bendryl. Had ordered capsaicin cream however pt declined due to burning. She is no longer having nausea however to control pain morphine has been ordered. If unable to control pain pt may need admission.   Pt's pain controlled with morphine and haldol. She has not vomited since. Will await repeat trop and then fluid challenge. Pt reports chest pain has resolved; questionable acid reflux causing chest pain?   Repeat troponin critical value of 1067. Will consult cardiology at this time. Again pt without chest pain currently. At shift change attending physician Dr. Juleen China has left; have made Dr. Bernette Mayers aware. Agrees with plan to  consult cardiology.   Discussed case with Dr. Tessa Lerner who recommends pt be admitted to medicine service at [facility]; will follow; recommends heparin bolus. Heparin per pharmacy and coag tests ordered. Pt was not tachycardic at first however began being tachy while in the ED. CTA ordered however symptoms with nausea and vomiting and no shortness of breath sound more cardiac than PE.   Discussed case with Dr. Elvera Lennox [facility] who agrees to accept patient for  admission.   This note was prepared using Dragon voice recognition software and may include unintentional dictation errors due to the inherent limitations of voice recognition software.  MDM Number of Diagnoses or Management Options Epigastric pain: established, worsening Non-intractable vomiting with nausea, unspecified vomiting type: established, worsening NSTEMI (non-ST elevated myocardial infarction) [facility]): new, needed workup   Amount and/or Complexity of Data Reviewed Clinical lab tests: ordered Tests in the radiology section of CPT: ordered Tests in the medicine section of CPT: ordered Discussion of test results with the performing providers: yes Decide to obtain previous medical records or to obtain history from someone other than the patient: yes Review and summarize past medical records: yes Discuss the patient with other providers: yes Independent visualization of images, tracings, or specimens: yes  Risk of Complications, Morbidity, and/or Mortality Presenting problems: high Diagnostic procedures: high Management options: high  Critical Care Total time providing critical care: 30-74 minutes  Patient Progress Patient progress: stable   Final Clinical Impression(s) / ED Diagnoses Final diagnoses:  NSTEMI (non-ST elevated myocardial infarction) (HCC)  Epigastric pain  Non-intractable vomiting with nausea, unspecified vomiting type    Rx / DC Orders ED Discharge Orders    None       Tanda Rockers, PA-C 09/23/19 1803    Raeford Razor, MD 09/24/19 1008

## 2019-09-23 NOTE — ED Triage Notes (Addendum)
Right upper quadrant pain. She was seen for the same pain 2 days ago. Admits to alcohol use 2 days ago.

## 2019-09-23 NOTE — Progress Notes (Signed)
ANTICOAGULATION CONSULT NOTE - Initial Consult  Pharmacy Consult for Heparin Indication: chest pain/ACS  Allergies  Allergen Reactions  . Penicillins Anaphylaxis    Has patient had a PCN reaction causing immediate rash, facial/tongue/throat swelling, SOB or lightheadedness with hypotension:Yes Has patient had a PCN reaction causing severe rash involving mucus membranes or skin necrosis:unknown Has patient had a PCN reaction that required hospitalization:Yes Has patient had a PCN reaction occurring within the last 10 years:No If all of the above answers are "NO", then may proceed with Cephalosporin use.     Patient Measurements: Height: 5' (152.4 cm) Weight: 78 kg (172 lb) IBW/kg (Calculated) : 45.5 Heparin Dosing Weight: 63.2 kg  Vital Signs: Temp: 98.4 F (36.9 C) (04/16 1132) Temp Source: Oral (04/16 1132) BP: 133/106 (04/16 1708) Pulse Rate: 115 (04/16 1708)  Labs: Recent Labs    09/21/19 2041 09/23/19 1141 09/23/19 1550  HGB 14.1 14.4  --   HCT 41.2 42.0  --   PLT 247 308  --   CREATININE 0.70 0.83  --   TROPONINIHS  --  11 1,067*    Estimated Creatinine Clearance: 82.4 mL/min (by C-G formula based on SCr of 0.83 mg/dL).   Medical History: Past Medical History:  Diagnosis Date  . Cardiomyopathy (HCC)   . ETOH abuse   . Gastritis   . GERD (gastroesophageal reflux disease)   . Heart failure (HCC)   . Pancreatitis     Assessment: 39 yo F presents with NSTEMI. Pharmacy asked to dose heparin. No AC noted PTA. CBC wnl, Scr 0.83.  Goal of Therapy:  Heparin level 0.3-0.7 units/ml Monitor platelets by anticoagulation protocol: Yes   Plan:  Give IV heparin 3800 unit bolus x1 Start heparin infusion at 800 units/hr Check 6-hr HL Monitor daily HL, CBC, s/sx bleeding  Tama Headings, PharmD PGY1 Pharmacy Resident Phone: [phone] 09/23/2019  5:21 PM  Please check [email or web address] for unit-specific pharmacy phone numbers.

## 2019-09-23 NOTE — ED Notes (Signed)
ED Provider at bedside. 

## 2019-09-24 ENCOUNTER — Inpatient Hospital Stay (HOSPITAL_COMMUNITY): Payer: BC Managed Care – PPO

## 2019-09-24 ENCOUNTER — Encounter (HOSPITAL_BASED_OUTPATIENT_CLINIC_OR_DEPARTMENT_OTHER): Payer: Self-pay | Admitting: Internal Medicine

## 2019-09-24 DIAGNOSIS — Z789 Other specified health status: Secondary | ICD-10-CM

## 2019-09-24 DIAGNOSIS — F1290 Cannabis use, unspecified, uncomplicated: Secondary | ICD-10-CM | POA: Diagnosis present

## 2019-09-24 DIAGNOSIS — R9431 Abnormal electrocardiogram [ECG] [EKG]: Secondary | ICD-10-CM | POA: Diagnosis present

## 2019-09-24 DIAGNOSIS — Z6833 Body mass index (BMI) 33.0-33.9, adult: Secondary | ICD-10-CM

## 2019-09-24 DIAGNOSIS — E6609 Other obesity due to excess calories: Secondary | ICD-10-CM

## 2019-09-24 DIAGNOSIS — R1013 Epigastric pain: Secondary | ICD-10-CM | POA: Diagnosis present

## 2019-09-24 DIAGNOSIS — F1090 Alcohol use, unspecified, uncomplicated: Secondary | ICD-10-CM

## 2019-09-24 DIAGNOSIS — Z8719 Personal history of other diseases of the digestive system: Secondary | ICD-10-CM

## 2019-09-24 DIAGNOSIS — Z8249 Family history of ischemic heart disease and other diseases of the circulatory system: Secondary | ICD-10-CM

## 2019-09-24 DIAGNOSIS — I214 Non-ST elevation (NSTEMI) myocardial infarction: Secondary | ICD-10-CM

## 2019-09-24 DIAGNOSIS — Z7289 Other problems related to lifestyle: Secondary | ICD-10-CM

## 2019-09-24 DIAGNOSIS — Z8679 Personal history of other diseases of the circulatory system: Secondary | ICD-10-CM

## 2019-09-24 DIAGNOSIS — R112 Nausea with vomiting, unspecified: Secondary | ICD-10-CM | POA: Diagnosis present

## 2019-09-24 DIAGNOSIS — R110 Nausea: Secondary | ICD-10-CM | POA: Diagnosis present

## 2019-09-24 DIAGNOSIS — Z87891 Personal history of nicotine dependence: Secondary | ICD-10-CM

## 2019-09-24 LAB — URINE CULTURE: Culture: <10000 — AB

## 2019-09-24 LAB — RAPID URINE DRUG SCREEN, HOSP PERFORMED
Amphetamines: NOT DETECTED
Barbiturates: NOT DETECTED
Benzodiazepines: NOT DETECTED
Cocaine: NOT DETECTED
Opiates: POSITIVE — AB
Tetrahydrocannabinol: POSITIVE — AB

## 2019-09-24 LAB — CBC
HCT: 40.2 % (ref 36.0–46.0)
Hemoglobin: 13.9 g/dL (ref 12.0–15.0)
MCH: 31.2 pg (ref 26.0–34.0)
MCHC: 34.6 g/dL (ref 30.0–36.0)
MCV: 90.1 fL (ref 80.0–100.0)
Platelets: 302 10*3/uL (ref 150–400)
RBC: 4.46 MIL/uL (ref 3.87–5.11)
RDW: 13.1 % (ref 11.5–15.5)
WBC: 18 10*3/uL — ABNORMAL HIGH (ref 4.0–10.5)
nRBC: 0 % (ref 0.0–0.2)

## 2019-09-24 LAB — LIPID PANEL
Cholesterol: 147 mg/dL (ref 0–200)
HDL: 61 mg/dL (ref >40)
LDL Cholesterol: 60 mg/dL (ref 0–99)
Total CHOL/HDL Ratio: 2.4 RATIO
Triglycerides: 130 mg/dL (ref <150)
VLDL: 26 mg/dL (ref 0–40)

## 2019-09-24 LAB — TROPONIN I (HIGH SENSITIVITY): Troponin I (High Sensitivity): 2125 ng/L (ref <18)

## 2019-09-24 LAB — BASIC METABOLIC PANEL
Anion gap: 15 (ref 5–15)
BUN: 7 mg/dL (ref 6–20)
CO2: 23 mmol/L (ref 22–32)
Calcium: 9 mg/dL (ref 8.9–10.3)
Chloride: 100 mmol/L (ref 98–111)
Creatinine, Ser: 0.78 mg/dL (ref 0.44–1.00)
GFR calc Af Amer: >60 mL/min (ref >60)
GFR calc non Af Amer: >60 mL/min (ref >60)
Glucose, Bld: 135 mg/dL — ABNORMAL HIGH (ref 70–99)
Potassium: 2.7 mmol/L — CL (ref 3.5–5.1)
Sodium: 138 mmol/L (ref 135–145)

## 2019-09-24 LAB — HEPARIN LEVEL (UNFRACTIONATED)
Heparin Unfractionated: 0.48 IU/mL (ref 0.30–0.70)
Heparin Unfractionated: 0.23 IU/mL — ABNORMAL LOW (ref 0.30–0.70)
Heparin Unfractionated: 0.32 IU/mL (ref 0.30–0.70)

## 2019-09-24 LAB — SARS CORONAVIRUS 2 (TAT 6-24 HRS): SARS Coronavirus 2: NEGATIVE

## 2019-09-24 LAB — PROTIME-INR
INR: 1 (ref 0.8–1.2)
Prothrombin Time: 13.1 seconds (ref 11.4–15.2)

## 2019-09-24 LAB — HEMOGLOBIN A1C
Hgb A1c MFr Bld: 5.5 % (ref 4.8–5.6)
Mean Plasma Glucose: 111.15 mg/dL

## 2019-09-24 LAB — HIV ANTIBODY (ROUTINE TESTING W REFLEX): HIV Screen 4th Generation wRfx: NONREACTIVE

## 2019-09-24 LAB — APTT: aPTT: 83 seconds — ABNORMAL HIGH (ref 24–36)

## 2019-09-24 LAB — ETHANOL: Alcohol, Ethyl (B): <10 mg/dL (ref <10)

## 2019-09-24 MED ORDER — FOLIC ACID 1 MG PO TABS
1.0000 mg | ORAL_TABLET | Freq: Every day | ORAL | Status: DC
  Administered 2019-09-24 – 2019-09-27 (×4): 1 mg via ORAL
  Filled 2019-09-24 (×4): qty 1

## 2019-09-24 MED ORDER — METOPROLOL SUCCINATE ER 25 MG PO TB24: 25.0000 mg | ORAL_TABLET | Freq: Every day | ORAL | Status: DC

## 2019-09-24 MED ORDER — POTASSIUM CHLORIDE CRYS ER 20 MEQ PO TBCR
20.0000 meq | EXTENDED_RELEASE_TABLET | Freq: Every day | ORAL | Status: DC
  Administered 2019-09-25 – 2019-09-27 (×3): 20 meq via ORAL
  Filled 2019-09-24 (×3): qty 1

## 2019-09-24 MED ORDER — ASPIRIN 81 MG PO CHEW
81.0000 mg | CHEWABLE_TABLET | Freq: Every day | ORAL | Status: DC
  Administered 2019-09-24 – 2019-09-27 (×4): 81 mg via ORAL
  Filled 2019-09-24 (×4): qty 1

## 2019-09-24 MED ORDER — THIAMINE HCL 100 MG/ML IJ SOLN
100.0000 mg | Freq: Every day | INTRAMUSCULAR | Status: DC
  Filled 2019-09-24: qty 2

## 2019-09-24 MED ORDER — ACETAMINOPHEN 325 MG PO TABS: 650.0000 mg | ORAL_TABLET | ORAL | Status: DC | PRN

## 2019-09-24 MED ORDER — ALUM & MAG HYDROXIDE-SIMETH 200-200-20 MG/5ML PO SUSP: 30.0000 mL | Freq: Four times a day (QID) | ORAL | Status: DC | PRN

## 2019-09-24 MED ORDER — ATORVASTATIN CALCIUM 40 MG PO TABS
40.0000 mg | ORAL_TABLET | Freq: Every day | ORAL | Status: DC
  Administered 2019-09-24 – 2019-09-26 (×2): 40 mg via ORAL
  Filled 2019-09-24 (×4): qty 1

## 2019-09-24 MED ORDER — LORAZEPAM 2 MG/ML IJ SOLN
1.0000 mg | INTRAMUSCULAR | Status: DC | PRN
  Administered 2019-09-24: 2 mg via INTRAVENOUS
  Filled 2019-09-24: qty 1

## 2019-09-24 MED ORDER — SPIRONOLACTONE 25 MG PO TABS
25.0000 mg | ORAL_TABLET | Freq: Every day | ORAL | Status: DC
  Administered 2019-09-24 – 2019-09-27 (×4): 25 mg via ORAL
  Filled 2019-09-24 (×4): qty 1

## 2019-09-24 MED ORDER — SACUBITRIL-VALSARTAN 49-51 MG PO TABS
1.0000 | ORAL_TABLET | Freq: Two times a day (BID) | ORAL | Status: DC
  Administered 2019-09-24 – 2019-09-27 (×7): 1 via ORAL
  Filled 2019-09-24 (×8): qty 1

## 2019-09-24 MED ORDER — ONDANSETRON HCL 4 MG/2ML IJ SOLN: 4.0000 mg | Freq: Four times a day (QID) | INTRAMUSCULAR | Status: DC | PRN

## 2019-09-24 MED ORDER — METOPROLOL TARTRATE 5 MG/5ML IV SOLN
2.5000 mg | Freq: Once | INTRAVENOUS | Status: AC
  Administered 2019-09-24: 2.5 mg via INTRAVENOUS

## 2019-09-24 MED ORDER — METOPROLOL TARTRATE 5 MG/5ML IV SOLN
INTRAVENOUS | Status: AC
  Filled 2019-09-24: qty 5

## 2019-09-24 MED ORDER — POTASSIUM CHLORIDE 10 MEQ/100ML IV SOLN
10.0000 meq | INTRAVENOUS | Status: DC
  Administered 2019-09-24 (×3): 10 meq via INTRAVENOUS
  Filled 2019-09-24: qty 100

## 2019-09-24 MED ORDER — LORAZEPAM 1 MG PO TABS: 1.0000 mg | ORAL_TABLET | ORAL | Status: DC | PRN

## 2019-09-24 MED ORDER — ADULT MULTIVITAMIN W/MINERALS CH
1.0000 | ORAL_TABLET | Freq: Every day | ORAL | Status: DC
  Administered 2019-09-24 – 2019-09-27 (×4): 1 via ORAL
  Filled 2019-09-24 (×4): qty 1

## 2019-09-24 MED ORDER — THIAMINE HCL 100 MG PO TABS
100.0000 mg | ORAL_TABLET | Freq: Every day | ORAL | Status: DC
  Administered 2019-09-24 – 2019-09-27 (×4): 100 mg via ORAL
  Filled 2019-09-24 (×4): qty 1

## 2019-09-24 MED ORDER — POTASSIUM CHLORIDE CRYS ER 20 MEQ PO TBCR
40.0000 meq | EXTENDED_RELEASE_TABLET | ORAL | Status: AC
  Administered 2019-09-24: 40 meq via ORAL
  Filled 2019-09-24: qty 2

## 2019-09-24 MED ORDER — METOPROLOL TARTRATE 5 MG/5ML IV SOLN
5.0000 mg | INTRAVENOUS | Status: DC | PRN
  Administered 2019-09-24 (×2): 5 mg via INTRAVENOUS
  Filled 2019-09-24 (×3): qty 5

## 2019-09-24 MED ORDER — METOPROLOL SUCCINATE ER 25 MG PO TB24
25.0000 mg | ORAL_TABLET | Freq: Every day | ORAL | Status: DC
  Administered 2019-09-24: 25 mg via ORAL
  Filled 2019-09-24: qty 1

## 2019-09-24 MED ORDER — SCOPOLAMINE 1 MG/3DAYS TD PT72
1.0000 | MEDICATED_PATCH | TRANSDERMAL | Status: DC | PRN
  Administered 2019-09-24: 1.5 mg via TRANSDERMAL
  Filled 2019-09-24 (×2): qty 1

## 2019-09-24 MED ORDER — NORGESTIMATE-ETH ESTRADIOL 0.25-35 MG-MCG PO TABS: 1.0000 | ORAL_TABLET | Freq: Every day | ORAL | Status: DC

## 2019-09-24 MED ORDER — LORAZEPAM 2 MG/ML IJ SOLN
0.0000 mg | Freq: Four times a day (QID) | INTRAMUSCULAR | Status: DC
  Administered 2019-09-24: 15:00:00 2 mg via INTRAVENOUS
  Filled 2019-09-24: qty 1

## 2019-09-24 MED ORDER — ATORVASTATIN CALCIUM 80 MG PO TABS: 80.0000 mg | ORAL_TABLET | Freq: Every day | ORAL | Status: DC

## 2019-09-24 MED ORDER — LORAZEPAM 2 MG/ML IJ SOLN: 0.0000 mg | Freq: Two times a day (BID) | INTRAMUSCULAR | Status: DC

## 2019-09-24 NOTE — ED Notes (Signed)
Attempted to call report to floor. Nurse was unavailable. Telephone number left with message to return call.

## 2019-09-24 NOTE — Procedures (Signed)
Echo attempted. Heart rate at 140 bpm. Will attempt again later.

## 2019-09-24 NOTE — H&P (Signed)
History and Physical    Tina Colon LAG:[id number] DOB: [birth year] DOA: 09/23/2019  Referring MD/NP/PA: Pamella Pert PCP: Georgina Quint, MD  Patient coming from: [facility] transfer   Chief Complaint: Abdominal pain  I have personally briefly reviewed patient's old medical records in [facility]   HPI: Tina Colon is a 39 y.o. female with medical history significant of systolic CHF, pancreatitis, alcohol abuse, and GERD presented with complaints of epigastric abdominal pain which started again yesterday morning.  Pain was described as sharp in nature with associated symptoms of nausea and vomiting.  There is no signs of blood in emesis. Previously seen in the emergency department 3 days ago with similar symptoms, but was able to be discharged home.  Currently she reports that she is chest pain free.  However, patient has had palpitations and felt somewhat jittery.  Normally she drinks anywhere from 2-5 beers per day on average.  Her last drink was possibly 5-6 days ago.  She has a previous history of smoking tobacco, but quit approximately 9 years ago.  ED Course: Upon admission into the emergency department patient was seen to be afebrile, heart rates up to 141, respirations 16-41, blood pressures maintained, O2 saturations documented as low as 87% for which patient had temporarily been placed on 2 L of nasal cannula oxygen, but currently weaned off.  Labs on 4/16->17 significant for WBC 12.7->18, potassium 2.6, magnesium 2, lipase 32, BNP 101.2, troponin 11->1067->2125.  CT angiogram of the chest was negative for any signs of a pulmonary embolus.  Patient was started on a heparin drip, given pain medicine, antiemetics, 20 mEq of potassium chloride IV, 5 mg of metoprolol.  [facility] cardiology was formally consulted.  Accepted to a stepdown bed and was reported to be chest pain-free prior to transport.  Review of Systems  Constitutional: Positive for malaise/fatigue. Negative for  fever.  Eyes: Negative for photophobia and pain.  Cardiovascular: Negative for chest pain and leg swelling.  Gastrointestinal: Positive for abdominal pain, nausea and vomiting.  Neurological: Positive for tremors.  Psychiatric/Behavioral: Positive for substance abuse.    Past Medical History:  Diagnosis Date  . Cardiomyopathy (HCC)   . CHF (congestive heart failure) (HCC)   . ETOH abuse   . Gastritis   . GERD (gastroesophageal reflux disease)   . Heart failure (HCC)   . Pancreatitis     Past Surgical History:  Procedure Laterality Date  . ESOPHAGOGASTRODUODENOSCOPY  06/19/2015    Dr Lavon Paganini  . WISDOM TOOTH EXTRACTION       reports that she quit smoking about 10 years ago. Her smoking use included cigarettes. She has a 5.00 pack-year smoking history. She has never used smokeless tobacco. She reports current alcohol use. She reports current drug use. Drug: Marijuana.  Allergies  Allergen Reactions  . Penicillins Anaphylaxis    Has patient had a PCN reaction causing immediate rash, facial/tongue/throat swelling, SOB or lightheadedness with hypotension:Yes Has patient had a PCN reaction causing severe rash involving mucus membranes or skin necrosis:unknown Has patient had a PCN reaction that required hospitalization:Yes Has patient had a PCN reaction occurring within the last 10 years:No If all of the above answers are "NO", then may proceed with Cephalosporin use.     Family History  Problem Relation Age of Onset  . Diabetes Mother   . Hyperlipidemia Mother   . Hypertension Father   . Gout Father   . Stomach cancer Maternal Grandfather  York Spaniel that his siblings died with cancer also but not sure what kind  . Diabetes Other   . Heart disease Other   . Irritable bowel syndrome Other   . Gallstones Sister        Had her gallbladder removed   . Gallstones Paternal Aunt        said a few on her dad's side     Prior to Admission medications   Medication Sig Start  Date End Date Taking? Authorizing Provider  folic acid (FOLVITE) 1 MG tablet Take 1 tablet (1 mg total) by mouth daily. 12/26/17   Alwyn Ren, MD  furosemide (LASIX) 20 MG tablet Take 1 tablet (20 mg total) by mouth as needed for edema. 08/19/19   Patwardhan, Anabel Bene, MD  Hyoscyamine Sulfate SL 0.125 MG SUBL Take 0.125 mg by mouth as needed (muscle cramp / pain.).     [provider]  magnesium oxide (MAG-OX) 400 MG tablet Take 1 tablet (400 mg total) by mouth 2 (two) times daily. 09/21/19   Pisciotta, Joni Reining, PA-C  metoprolol succinate (TOPROL-XL) 25 MG 24 hr tablet Take 1 tablet (25 mg total) by mouth daily. 08/19/19   Patwardhan, Anabel Bene, MD  norgestimate-ethinyl estradiol (ORTHO-CYCLEN) 0.25-35 MG-MCG tablet Take 1 tablet by mouth daily.    [provider]  ondansetron (ZOFRAN ODT) 4 MG disintegrating tablet 4mg  ODT q4 hours prn nausea/vomit Patient taking differently: Take 4 mg by mouth every 4 (four) hours as needed for nausea or vomiting.  09/21/19   Pisciotta, Joni Reining, PA-C  pantoprazole (PROTONIX) 40 MG tablet Take 1 tablet by mouth once daily 07/28/19   Meredith Pel, NP  potassium chloride SA (KLOR-CON) 20 MEQ tablet Take 1 tablet (20 mEq total) by mouth daily. 09/21/19   Pisciotta, Joni Reining, PA-C  sacubitril-valsartan (ENTRESTO) 49-51 MG Take 1 tablet by mouth 2 (two) times daily. 08/19/19   Patwardhan, Anabel Bene, MD  spironolactone (ALDACTONE) 25 MG tablet Take 1 tablet (25 mg total) by mouth daily. 08/19/19   Patwardhan, Anabel Bene, MD  thiamine (VITAMIN B-1) 100 MG tablet Take 100 mg by mouth daily.    [provider]  TRI-LO-MILI 0.18/0.215/0.25 MG-25 MCG tab Take 1 tablet by mouth daily. 09/19/19   [provider]    Physical Exam:  Constitutional: Middle-aged female currently in NAD, calm, comfortable Vitals:   09/24/19 0430 09/24/19 0500 09/24/19 0525 09/24/19 0758  BP: (!) 118/93 (!) 128/105 (!) 124/100 129/90  Pulse: (!) 130 (!) 129 (!)  124 (!) 139  Resp:  16  15  Temp:    98.8 F (37.1 C)  TempSrc:    Oral  SpO2: 100% 99% 100% 97%  Weight:    77.3 kg  Height:    5' (1.524 m)   Eyes: PERRL, lids and conjunctivae normal ENMT: Mucous membranes are moist. Posterior pharynx clear of any exudate or lesions.  Neck: normal, supple, no masses, no thyromegaly Respiratory: clear to auscultation bilaterally, no wheezing, no crackles. Normal respiratory effort. No accessory muscle use.  Cardiovascular: Tachycardic, no murmurs / rubs / gallops. No extremity edema. 2+ pedal pulses. No carotid bruits.  Abdomen: no tenderness, no masses palpated. No hepatosplenomegaly. Bowel sounds positive.  Musculoskeletal: no clubbing / cyanosis. No joint deformity upper and lower extremities. Good ROM, no contractures. Normal muscle tone.  Skin: no rashes, lesions, ulcers. No induration Neurologic: CN 2-12 grossly intact. Sensation intact, DTR normal. Strength 5/5 in all 4.  Psychiatric: Normal judgment and insight.  Alert and oriented x 3. Normal mood.     Labs on Admission: I have personally reviewed following labs and imaging studies  CBC: Recent Labs  Lab 09/21/19 2041 09/23/19 1141 09/24/19 0531  WBC 11.2* 12.7* 18.0*  HGB 14.1 14.4 13.9  HCT 41.2 42.0 40.2  MCV 90.7 89.0 90.1  PLT 247 308 302   Basic Metabolic Panel: Recent Labs  Lab 09/21/19 2041 09/23/19 1141  NA 140 138  K 3.1* 2.6*  CL 102 100  CO2 23 22  GLUCOSE 180* 142*  BUN 9 15  CREATININE 0.70 0.83  CALCIUM 9.7 9.9  MG 1.6* 2.0   GFR: Estimated Creatinine Clearance: 82 mL/min (by C-G formula based on SCr of 0.83 mg/dL). Liver Function Tests: Recent Labs  Lab 09/21/19 2041 09/23/19 1141  AST 29 26  ALT 21 22  ALKPHOS 42 43  BILITOT 0.3 0.4  PROT 7.9 8.0  ALBUMIN 4.5 4.4   Recent Labs  Lab 09/21/19 2041 09/23/19 1141  LIPASE 23 32   No results for input(s): AMMONIA in the last 168 hours. Coagulation Profile: Recent Labs  Lab 09/24/19 0046    INR 1.0   Cardiac Enzymes: No results for input(s): CKTOTAL, CKMB, CKMBINDEX, TROPONINI in the last 168 hours. BNP (last 3 results) No results for input(s): PROBNP in the last 8760 hours. HbA1C: No results for input(s): HGBA1C in the last 72 hours. CBG: No results for input(s): GLUCAP in the last 168 hours. Lipid Profile: No results for input(s): CHOL, HDL, LDLCALC, TRIG, CHOLHDL, LDLDIRECT in the last 72 hours. Thyroid Function Tests: No results for input(s): TSH, T4TOTAL, FREET4, T3FREE, THYROIDAB in the last 72 hours. Anemia Panel: No results for input(s): VITAMINB12, FOLATE, FERRITIN, TIBC, IRON, RETICCTPCT in the last 72 hours. Urine analysis:    Component Value Date/Time   COLORURINE BROWN (A) 09/23/2019 1248   APPEARANCEUR CLEAR 09/23/2019 1248   LABSPEC >1.030 (H) 09/23/2019 1248   PHURINE 6.0 09/23/2019 1248   GLUCOSEU NEGATIVE 09/23/2019 1248   HGBUR LARGE (A) 09/23/2019 1248   BILIRUBINUR SMALL (A) 09/23/2019 1248   BILIRUBINUR neg 11/29/2013 1015   KETONESUR 40 (A) 09/23/2019 1248   PROTEINUR >300 (A) 09/23/2019 1248   UROBILINOGEN 0.2 11/05/2014 1335   NITRITE NEGATIVE 09/23/2019 1248   LEUKOCYTESUR NEGATIVE 09/23/2019 1248   Sepsis Labs: Recent Results (from the past 240 hour(s))  SARS CORONAVIRUS 2 (TAT 6-24 HRS) Nasopharyngeal Nasopharyngeal Swab     Status: None   Collection Time: 09/23/19  5:09 PM   Specimen: Nasopharyngeal Swab  Result Value Ref Range Status   SARS Coronavirus 2 NEGATIVE NEGATIVE Final    Comment: (NOTE) SARS-CoV-2 target nucleic acids are NOT DETECTED. The SARS-CoV-2 RNA is generally detectable in upper and lower respiratory specimens during the acute phase of infection. Negative results do not preclude SARS-CoV-2 infection, do not rule out co-infections with other pathogens, and should not be used as the sole basis for treatment or other patient management decisions. Negative results must be combined with clinical  observations, patient history, and epidemiological information. The expected result is Negative. Fact Sheet for Patients: HairSlick.no Fact Sheet for Healthcare Providers: quierodirigir.com This test is not yet approved or cleared by the [place] FDA and  has been authorized for detection and/or diagnosis of SARS-CoV-2 by FDA under an Emergency Use Authorization (EUA). This EUA will remain  in effect (meaning this test can be used) for the duration of the COVID-19 declaration under Section 56 4(b)(1) of the Act,  21 U.S.C. section 360bbb-3(b)(1), unless the authorization is terminated or revoked sooner. Performed at [facility], [address] [place], [place] [address]      Radiological Exams on Admission: CT Angio Chest PE W/Cm &/Or Wo Cm  Result Date: 09/23/2019 CLINICAL DATA:  Shortness of breath. History of congestive heart failure. Abdominal pain for 1 year with nausea and vomiting. EXAM: CT ANGIOGRAPHY CHEST WITH CONTRAST TECHNIQUE: Multidetector CT imaging of the chest was performed using the standard protocol during bolus administration of intravenous contrast. Multiplanar CT image reconstructions and MIPs were obtained to evaluate the vascular anatomy. CONTRAST:  75mL OMNIPAQUE IOHEXOL 350 MG/ML SOLN COMPARISON:  12/23/2017 FINDINGS: Cardiovascular: No filling defect is identified in the pulmonary arterial tree to suggest pulmonary embolus. Mildly prominent left ventricle. Mediastinum/Nodes: Unremarkable Lungs/Pleura: Unremarkable Upper Abdomen: Unremarkable Musculoskeletal: Thoracic spondylosis. Review of the MIP images confirms the above findings. IMPRESSION: 1. No filling defect is identified in the pulmonary arterial tree to suggest pulmonary embolus. 2. Mildly prominent left ventricle. Electronically Signed   By: Gaylyn Rong M.D.   On: 09/23/2019 19:08   DG Chest Port 1 View  Result Date:  09/23/2019 CLINICAL DATA:  Chest pain. EXAM: PORTABLE CHEST 1 VIEW COMPARISON:  December 23, 2017. FINDINGS: The heart size and mediastinal contours are within normal limits. Both lungs are clear. No pneumothorax or pleural effusion is noted. The visualized skeletal structures are unremarkable. IMPRESSION: No active disease. Electronically Signed   By: Lupita Raider M.D.   On: 09/23/2019 13:55    EKG: Independently reviewed.  Sinus tachycardia 106 bpm with PVCs  Assessment/Plan NSTEMI (non-ST elevated myocardial infarction): Acute.  Patient presented with complaints of epigastric pain.  Chest x-ray clear of any acute abnormalities.  Found to have elevated troponins 11->1067->2125.  -Admit to a progressive bed -Continue heparin per pharmacy -Check lipid panel -Check echocardiogram -Follow-up urine drug screen -Aspirin and atorvastatin 40 mg daily -Appreciate cardiology, will follow-up for further recommendations  Nausea and vomiting, epigastric pain: Suspect epigastric pain secondary to patient's history of alcohol use and likely gastritis. -Scopolamine patch as needed for nausea  Hypokalemia: Acute.  Potassium initially noted to be 2.6 on 4/16.  Patient had been given 20 mEq of potassium chloride IV. -Give additional 30 mEq potassium chloride IV and 40 mEq p.o. -Continue to monitor and replace as needed  Leukocytosis: Acute.  WBC trending up 11.2-> 12.7-> 18.  Patient has otherwise been afebrile. -Continue to monitor  Systolic congestive heart failure: Patient appears to show no significant signs of being fluid overloaded at this time.  Last echocardiogram from 05/19/2018 noted EF of approximately 20 to 25% mild MR and TR.  Subsequent cardiac MRI from 07/23/2018 noted mild left ventricular enlargement with borderline LV EF of 50%. - strict intake and output -Daily weights  Tachycardia: Heart rates initially elevated up to 140.  Suspect this is secondary to possible alcohol  withdrawals. -Continue home medications of metoprolol  Alcohol abuse: Patient reports drinking 2-5 beers per day on average. -CIWA protocols with scheduled Ativan as needed -Transitions of care consult for information on alcohol abuse  GERD -Hold Protonix due to prolonged QT interval  History of marijuana use: Patient does admit to marijuana use.  Off label use of capsaicin gel have been tried in the ED for cannabinoid hyperemesis.   -Counseled on need to refrain from marijuana use as well  Former tobacco abuse  Obesity: BMI 33.3 kg/m  DVT prophylaxis: Heparin per pharmacy Code Status:  Full Family Communication: Husband updated at bedside Disposition Plan: Likely discharge home in 2-4 depending on cardiac cath Consults called: [facility] cardiology Admission status: Inpatient  Norval Morton MD [facility] Pager [phone]   If 7PM-7AM, please contact night-coverage [email or web address] Password [email or web address]  09/24/2019, 8:02 AM

## 2019-09-24 NOTE — Consult Note (Signed)
CARDIOLOGY CONSULT NOTE  Patient ID: Tina Colon MRN: [id number] DOB/AGE: 41/03/1981 41 y.o.  Admit date: 09/23/2019 Referring Physician: Clydie Braun, MD Primary Physician:  Georgina Quint, MD Reason for Consultation: Chest pain  HPI:  Tina Colon is a 39 y.o. female who presents with a chief complaint of " abdominal pain, nausea, vomiting." She past medical history and cardiovascular risk factors include: Recovered cardiomyopathy, EtOH dependence, history of pancreatitis, gastritis, obesity, former smoker.  At the time of evaluation (~1045am)patient was accompanied by her sister and husband.  She provided verbal consent to discuss her medical information in the presence.  Patient states that since this past Wednesday she has been having epigastric discomfort 10 intensity associated with nausea and vomiting.  Because symptoms improving patient went to the hospital for further evaluation.  Patient states that the symptoms were going on for 3 to 4 days prior to this hospitalization.  Her gastroenterology and was recommended to start Hyoscyamine but this did not improve her overall symptoms.  Patient states that the epigastric discomfort is not effort related nor does it improve with rest.  Patient has an extensive history of alcohol use.  She drinks at least 2-5 beers per day.  Her last drink was the past Sunday when she had 2 beers.  She is known to have nonischemic cardiomyopathy for the past.  Patient had a cardiac MRI back in February 2020 which noted that the LVEF improved to 50%.  This is in comparison to her echocardiogram from December 2019 which noted an LVEF of 20-25%.  She also had a cardiac CTA back in 2019 which noted calcium score of 0 no evidence of CAD.  Patient was noted to have tachycardia and tachypnea and underwent CT chest which was negative for pulmonary embolism.  Additional details noted below.  Patient's initial troponins was within normal limits;  however, since then they have been uptrending.  Currently at the time of the evaluation patient is chest pain-free and not in overt heart failure.  Patient states that she is compliant with her heart failure medications including Entresto, Aldactone, Lasix, metoprolol.  Patient was on Klonopin which was discontinued secondary to cost prohibitive.  Recent marijuana use, the day before admission.  Family history of premature coronary artery disease.  Patient states that her mother had a myocardial infarction at the age of 60 and father had a myocardial infarction in his 62s.  No history of dilated cardiomyopathy in the family according to the patient.  No history of sudden cardiac death.  ALLERGIES: Allergies  Allergen Reactions  . Penicillins Anaphylaxis    Has patient had a PCN reaction causing immediate rash, facial/tongue/throat swelling, SOB or lightheadedness with hypotension:Yes Has patient had a PCN reaction causing severe rash involving mucus membranes or skin necrosis:unknown Has patient had a PCN reaction that required hospitalization:Yes Has patient had a PCN reaction occurring within the last 10 years:No If all of the above answers are "NO", then may proceed with Cephalosporin use.     PAST MEDICAL HISTORY: Past Medical History:  Diagnosis Date  . Cardiomyopathy (HCC)   . CHF (congestive heart failure) (HCC)   . ETOH abuse   . Gastritis   . GERD (gastroesophageal reflux disease)   . Heart failure (HCC)   . Pancreatitis     PAST SURGICAL HISTORY: Past Surgical History:  Procedure Laterality Date  . ESOPHAGOGASTRODUODENOSCOPY  06/19/2015    Dr Lavon Paganini  . WISDOM TOOTH EXTRACTION  FAMILY HISTORY: The patient family history includes Diabetes in her mother and another family member; Gallstones in her paternal aunt and sister; Gout in her father; Heart disease in an other family member; Hyperlipidemia in her mother; Hypertension in her father; Irritable bowel  syndrome in an other family member; Stomach cancer in her maternal grandfather.   SOCIAL HISTORY:  The patient  reports that she quit smoking about 10 years ago. Her smoking use included cigarettes. She has a 5.00 pack-year smoking history. She has never used smokeless tobacco. She reports current alcohol use. She reports current drug use. Drug: Marijuana.  MEDICATIONS: Medications Prior to Admission  Medication Sig Dispense Refill Last Dose  . Hyoscyamine Sulfate SL 0.125 MG SUBL Take 0.125 mg by mouth as needed (muscle cramp / pain.).    09/23/2019 at Unknown time  . metoprolol succinate (TOPROL-XL) 25 MG 24 hr tablet Take 1 tablet (25 mg total) by mouth daily. (Patient taking differently: Take 12.5 mg by mouth every evening. ) 90 tablet 3 Past Week at 9pm  . norgestimate-ethinyl estradiol (ORTHO-CYCLEN) 0.25-35 MG-MCG tablet Take 1 tablet by mouth daily.   09/23/2019 at Unknown time  . ondansetron (ZOFRAN ODT) 4 MG disintegrating tablet 4mg  ODT q4 hours prn nausea/vomit (Patient taking differently: Take 4 mg by mouth every 4 (four) hours as needed for nausea or vomiting. ) 4 tablet 0 unknown  . pantoprazole (PROTONIX) 40 MG tablet Take 1 tablet by mouth once daily 30 tablet 2 09/23/2019 at Unknown time  . potassium chloride SA (KLOR-CON) 20 MEQ tablet Take 1 tablet (20 mEq total) by mouth daily. 3 tablet 0 unknown  . sacubitril-valsartan (ENTRESTO) 49-51 MG Take 1 tablet by mouth 2 (two) times daily. 180 tablet 3 09/23/2019 at Unknown time  . spironolactone (ALDACTONE) 25 MG tablet Take 1 tablet (25 mg total) by mouth daily. 90 tablet 0 09/23/2019 at Unknown time  . folic acid (FOLVITE) 1 MG tablet Take 1 tablet (1 mg total) by mouth daily. (Patient not taking: Reported on 09/24/2019)   Not Taking at Unknown time  . furosemide (LASIX) 20 MG tablet Take 1 tablet (20 mg total) by mouth as needed for edema. (Patient not taking: Reported on 09/24/2019) 60 tablet 3 Not Taking  . magnesium oxide (MAG-OX) 400  MG tablet Take 1 tablet (400 mg total) by mouth 2 (two) times daily. (Patient not taking: Reported on 09/24/2019) 6 tablet 0 Not Taking at Unknown time  Patient states that she is not taking folic acid or vitamin B1.   . sodium chloride    . heparin 950 Units/hr (09/24/19 0951)  . potassium chloride 10 mEq (09/24/19 1123)    Current Outpatient Medications  Medication Instructions  . folic acid (FOLVITE) 1 mg, Oral, Daily  . furosemide (LASIX) 20 mg, Oral, As needed  . Hyoscyamine Sulfate SL 0.125 mg, Oral, As needed  . magnesium oxide (MAG-OX) 400 mg, Oral, 2 times daily  . metoprolol succinate (TOPROL-XL) 25 mg, Oral, Daily  . norgestimate-ethinyl estradiol (ORTHO-CYCLEN) 0.25-35 MG-MCG tablet 1 tablet, Oral, Daily  . ondansetron (ZOFRAN ODT) 4 MG disintegrating tablet 4mg  ODT q4 hours prn nausea/vomit  . pantoprazole (PROTONIX) 40 MG tablet Take 1 tablet by mouth once daily  . potassium chloride SA (KLOR-CON) 20 MEQ tablet 20 mEq, Oral, Daily  . sacubitril-valsartan (ENTRESTO) 49-51 MG 1 tablet, Oral, 2 times daily  . spironolactone (ALDACTONE) 25 mg, Oral, Daily    Review of Systems  Constitution: Negative for chills and fever.  HENT: Negative for ear discharge, ear pain and nosebleeds.   Eyes: Negative for blurred vision and discharge.  Cardiovascular: Positive for chest pain. Negative for claudication, dyspnea on exertion, leg swelling, near-syncope, orthopnea, palpitations, paroxysmal nocturnal dyspnea and syncope.  Respiratory: Negative for cough and shortness of breath.   Endocrine: Negative for polydipsia, polyphagia and polyuria.  Hematologic/Lymphatic: Negative for bleeding problem.  Skin: Negative for flushing and nail changes.  Musculoskeletal: Negative for muscle cramps, muscle weakness and myalgias.  Gastrointestinal: Positive for abdominal pain, nausea (improving. ) and vomiting (improving). Negative for dysphagia, hematemesis, hematochezia and melena.  Neurological:  Negative for dizziness, focal weakness and light-headedness.   PHYSICAL EXAM: Vitals with BMI 09/24/2019 09/24/2019 09/24/2019  Height 5\' 0"  - -  Weight 170 lbs 8 oz - -  BMI 33.3 - -  Systolic 129 124  Diastolic 90 100 105  Pulse 139 124 129   CONSTITUTIONAL: Well-developed and well-nourished. No acute distress.  SKIN: Skin is warm and dry. No rash noted. No cyanosis. No pallor. No jaundice HEAD: Normocephalic and atraumatic.  EYES: No scleral icterus MOUTH/THROAT: Moist oral membranes.  NECK: No JVD present. No thyromegaly noted. No carotid bruits  LYMPHATIC: No visible cervical adenopathy.  CHEST Normal respiratory effort. No intercostal retractions  LUNGS: Clear to auscultation bilaterally.  No stridor. No wheezes. No rales.  CARDIOVASCULAR: Regular rate and rhythm, positive S1-S2, soft holosystolic murmur heard at the apex, no rubs or gallops appreciated. ABDOMINAL: No apparent ascites.  EXTREMITIES: No peripheral edema  HEMATOLOGIC: No significant bruising NEUROLOGIC: Oriented to person, place, and time. Nonfocal. Normal muscle tone.  PSYCHIATRIC: Normal mood and affect. Normal behavior. Cooperative  RADIOLOGY: CT Angio Chest PE W/Cm &/Or Wo Cm  Result Date: 09/23/2019 CLINICAL DATA:  Shortness of breath. History of congestive heart failure. Abdominal pain for 1 year with nausea and vomiting. EXAM: CT ANGIOGRAPHY CHEST WITH CONTRAST TECHNIQUE: Multidetector CT imaging of the chest was performed using the standard protocol during bolus administration of intravenous contrast. Multiplanar CT image reconstructions and MIPs were obtained to evaluate the vascular anatomy. CONTRAST:  91mL OMNIPAQUE IOHEXOL 350 MG/ML SOLN COMPARISON:  12/23/2017 FINDINGS: Cardiovascular: No filling defect is identified in the pulmonary arterial tree to suggest pulmonary embolus. Mildly prominent left ventricle. Mediastinum/Nodes: Unremarkable Lungs/Pleura: Unremarkable Upper Abdomen: Unremarkable  Musculoskeletal: Thoracic spondylosis. Review of the MIP images confirms the above findings. IMPRESSION: 1. No filling defect is identified in the pulmonary arterial tree to suggest pulmonary embolus. 2. Mildly prominent left ventricle. Electronically Signed   By: 12/25/2017 M.D.   On: 09/23/2019 19:08   DG Chest Port 1 View  Result Date: 09/23/2019 CLINICAL DATA:  Chest pain. EXAM: PORTABLE CHEST 1 VIEW COMPARISON:  December 23, 2017. FINDINGS: The heart size and mediastinal contours are within normal limits. Both lungs are clear. No pneumothorax or pleural effusion is noted. The visualized skeletal structures are unremarkable. IMPRESSION: No active disease. Electronically Signed   By: December 25, 2017 M.D.   On: 09/23/2019 13:55    LABORATORY DATA: Lab Results  Component Value Date   WBC 18.0 (H) 09/24/2019   HGB 13.9 09/24/2019   HCT 40.2 09/24/2019   MCV 90.1 09/24/2019   PLT 302 09/24/2019    Recent Labs  Lab 09/23/19 1141 09/23/19 1141 09/24/19 0919  NA 138   < > 138  K 2.6*   < > 2.7*  CL 100   < > 100  CO2 22   < > 23  BUN  15   < > 7  CREATININE 0.83   < > 0.78  CALCIUM 9.9   < > 9.0  PROT 8.0  --   --   BILITOT 0.4  --   --   ALKPHOS 43  --   --   ALT 22  --   --   AST 26  --   --   GLUCOSE 142*   < > 135*   < > = values in this interval not displayed.    Lipid Panel     Component Value Date/Time   CHOL 147 09/24/2019 0919   CHOL 144 10/02/2017 1031   TRIG 130 09/24/2019 0919   HDL 61 09/24/2019 0919   HDL 56 10/02/2017 1031   CHOLHDL 2.4 09/24/2019 0919   VLDL 26 09/24/2019 0919   LDLCALC 60 09/24/2019 0919   LDLCALC 64 10/02/2017 1031    BNP (last 3 results) Recent Labs    09/23/19 1141  BNP 101.2*  In July 2019 BNP was 411.  HEMOGLOBIN A1C Lab Results  Component Value Date   HGBA1C 5.4 10/02/2017   MPG 108 06/05/2015    Cardiac Panel (last 3 results) Recent Labs    09/23/19 1141 09/23/19 1550 09/24/19 0342  TROPONINIHS 11 1,067*  2,125*   TSH No results for input(s): TSH in the last 8760 hours.   Scheduled Meds: . aspirin  81 mg Oral Daily  . atorvastatin  80 mg Oral Daily  . capsaicin   Topical Once  . metoprolol succinate  25 mg Oral Daily  . potassium chloride  40 mEq Oral STAT  . sacubitril-valsartan  1 tablet Oral BID  . spironolactone  25 mg Oral Daily   Continuous Infusions: . sodium chloride    . heparin 950 Units/hr (09/24/19 0951)  . potassium chloride 10 mEq (09/24/19 1123)   PRN Meds:.sodium chloride, acetaminophen, alum & mag hydroxide-simeth, ondansetron (ZOFRAN) IV  CARDIAC DATABASE: EKG: 08/19/2019: Sinus rhythm 70 bpm. Nonspecific T wave inversion inferior lead. 09/24/2019: Sinus tachycardia, ventricular rate 234 bpm, left axis deviation, indeterminate anteroseptal infarct, prolonged QT.  Cardiac MRI 07/23/2018: 1. Mild left ventricular enlargement with borderline LVEF 50%. 2. There is no late gadolinium enhancement in the left ventricular myocardium. 3. Normal right ventricular chamber size and function, RVEF 56%.  Echocardiogram 05/19/2018: 1. Left ventricle cavity is mildly dilated. Severe decrease in global wall motion. Visual EF is 20-25%. 2. Mild (Grade I) mitral regurgitation. 3. Mild tricuspid regurgitation. No evidence of pulmonary Hypertension. Heart failure with reduced ejection fraction, NYHA class II (I50.20)    Hospital echocardiogram 12/24/2017: Study Conclusions: - Left ventricle: The cavity size was moderately to severelydilated. Systolic function was severely reduced. The estimatedejection fraction was 15-20%. Diffuse hypokinesis. Profound  diffuse hypokinesis throughout, with near akinesis of septal and anterior walls. - Aortic valve: Transvalvular velocity was within the normal range.There was no stenosis. There was no regurgitation. - Mitral valve: There was mild regurgitation. - Left atrium: The atrium was moderately to severely dilated. - Right ventricle: The  cavity size was mildly dilated. Wall  thickness was normal. Systolic function was normal. - Tricuspid valve: There was trivial regurgitation. - Pulmonic valve: There was no significant regurgitation. - Pericardium, extracardiac: A trivial pericardial effusion was  identified posterior to the heart.  Impressions: - Severely reduced ejection fraction of 15-20%. Global severehypokinesis with near akinesis of anterior and septal walls.Severely dilated left atrium, mild MR. No significant TR toestimate filling pressures.  CTA coronary 03/23/2018: Calcium  score 0. No evidence of CAD.  IMPRESSION & RECOMMENDATIONS: Tina Colon is a 39 y.o. female whose past medical history and cardiovascular risk factors include: Recovered cardiomyopathy, EtOH dependence, history of pancreatitis, gastritis, obesity, former smoker.  Non-STEMI most likely secondary to supply demand ischemia. Epigastric pain. Sinus tachycardia. History of nonischemic cardiomyopathy, recovered based on last imaging study Family history of premature coronary artery disease Prolonged QT, most likely secondary to electrolyte imbalance Obesity, due to excess calories, Body mass index is 33.3 kg/m.  Former smoker. Marijuana use. Alcohol dependence Intractable nausea and vomiting. Hypokalemia Hypomagnesemia, resolved History of pancreatitis.  Patient's presentation of epigastric discomfort is most likely secondary to excessive alcohol use causing her to have intractable nausea and vomiting.  However, in the setting of uptrending cardiac biomarkers cannot rule out underlying cardiac ischemia.  Currently patient is chest pain-free.  Currently on IV heparin drip for non-STEMI.  Echocardiogram pending.    She has a history of severely reduced left ventricular systolic function which recovered based on the most recent cardiac MRI.  She is currently on heart failure medications and overtly is not in congestive heart failure.  She  is educated extensively on the importance of complete alcohol cessation and use of illicit drugs.  Continue telemetry  We will restart home dose Entresto and spironolactone for now  Ordered alcohol level and urine drug screen.  Pregnancy screen is negative.  Given her history of cardiomyopathy, epigastric discomfort, elevated troponins, discussed possibly going for left and right heart catheterization during this hospitalization.  Patient would like some time to think about it prior to making a decision.  Echocardiogram is ordered, results pending  Plan of care was discussed with the patient, her sister and husband at the time of the encounter.  She will provide verbal consent to discuss her medical information in their presence.  Patient's questions and concerns were addressed to her satisfaction. She voices understanding of the instructions provided during this encounter.   Total encounter time 82 minutes. Total Encounter Time as defined by the Centers for Medicare and Medicaid Services includes, in addition to the face-to-face time of a patient visit (documented in the note above) non-face-to-face time: obtaining and reviewing outside history, ordering and reviewing medications, tests or procedures, care coordination (communications with other health care professionals or caregivers) and documentation in the medical record.  This note was created using a voice recognition software as a result there may be grammatical errors inadvertently enclosed that do not reflect the nature of this encounter. Every attempt is made to correct such errors.  Tessa Lerner, DO, [facility] Office: [phone] 09/24/2019, 12:06 PM

## 2019-09-24 NOTE — ED Notes (Signed)
Date and time results received: 09/24/19 0441 (use smartphrase ".now" to insert current time)  Test: troponin Critical Value: 2125  Name of Provider Notified: Dr. Preston Fleeting  Orders Received? Or Actions Taken?: no new orders

## 2019-09-24 NOTE — Progress Notes (Signed)
ANTICOAGULATION CONSULT NOTE  Pharmacy Consult for Heparin Indication: chest pain/ACS  Allergies  Allergen Reactions  . Penicillins Anaphylaxis    Has patient had a PCN reaction causing immediate rash, facial/tongue/throat swelling, SOB or lightheadedness with hypotension:Yes Has patient had a PCN reaction causing severe rash involving mucus membranes or skin necrosis:unknown Has patient had a PCN reaction that required hospitalization:Yes Has patient had a PCN reaction occurring within the last 10 years:No If all of the above answers are "NO", then may proceed with Cephalosporin use.     Patient Measurements: Height: 5' (152.4 cm) Weight: 78 kg (172 lb) IBW/kg (Calculated) : 45.5 Heparin Dosing Weight: 63.2 kg  Vital Signs: BP: 111/80 (04/17 0041) Pulse Rate: 117 (04/17 0041)  Labs: Recent Labs    09/21/19 2041 09/23/19 1141 09/23/19 1550 09/24/19 0046  HGB 14.1 14.4  --   --   HCT 41.2 42.0  --   --   PLT 247 308  --   --   APTT  --   --   --  83*  LABPROT  --   --   --  13.1  INR  --   --   --  1.0  HEPARINUNFRC  --   --   --  0.48  CREATININE 0.70 0.83  --   --   TROPONINIHS  --  11 1,067*  --     Estimated Creatinine Clearance: 82.4 mL/min (by C-G formula based on SCr of 0.83 mg/dL).   Assessment: 39 yo F presents with NSTEMI. Pharmacy asked to dose heparin and initial heparin level is therapeutic.  No bleeding reported.   Goal of Therapy:  Heparin level 0.3-0.7 units/ml Monitor platelets by anticoagulation protocol: Yes   Plan:  Continue heparin infusion at 800 units/hr Check confirmatory heparin level with AM labs  Tymeshia Awan D. Laney Potash, PharmD, BCPS, BCCCP 09/24/2019, 2:57 AM

## 2019-09-24 NOTE — ED Provider Notes (Addendum)
Patient remains pain free, but has developed tachycardia with heart rate 140-150.  Monitor shows sinus tachycardia with occasional PVCs.  Will recheck troponin, ECG and give IV metoprolol.   Dione Booze, MD 09/24/19 [phone]  ECG is unchanged.  Troponin has continued to rise, but she continues to be pain-free.  Heart rate has improved significantly with IV metoprolol, although she is still mildly tachycardic.  Ambulances here to take [facility] to [facility].   EKG Interpretation  Date/Time:  Saturday September 24 2019 04:05:09 EDT Ventricular Rate:  126 PR Interval:    QRS Duration: 90 QT Interval:  366 QTC Calculation: 530 R Axis:   -10 Text Interpretation: Sinus tachycardia Multiple ventricular premature complexes Anterior infarct, old Borderline T abnormalities, inferior leads Prolonged QT interval When compared with ECG of 09/23/2019, No significant change was found Confirmed by Dione Booze ([id number]) on 09/24/2019 4:08:56 AM         Dione Booze, MD 09/24/19 [phone]

## 2019-09-24 NOTE — Procedures (Signed)
Echo attempted a second time. Patient HR still over 140.

## 2019-09-24 NOTE — Progress Notes (Signed)
ANTICOAGULATION CONSULT NOTE  Pharmacy Consult for Heparin Indication: chest pain/ACS  Allergies  Allergen Reactions  . Penicillins Anaphylaxis    Has patient had a PCN reaction causing immediate rash, facial/tongue/throat swelling, SOB or lightheadedness with hypotension:Yes Has patient had a PCN reaction causing severe rash involving mucus membranes or skin necrosis:unknown Has patient had a PCN reaction that required hospitalization:Yes Has patient had a PCN reaction occurring within the last 10 years:No If all of the above answers are "NO", then may proceed with Cephalosporin use.     Patient Measurements: Height: 5' (152.4 cm) Weight: 77.3 kg (170 lb 8 oz) IBW/kg (Calculated) : 45.5 Heparin Dosing Weight: 63.2 kg  Vital Signs: Temp: 98.8 F (37.1 C) (04/17 0758) Temp Source: Oral (04/17 0758) BP: 129/90 (04/17 0758) Pulse Rate: 139 (04/17 0758)  Labs: Recent Labs    09/21/19 2041 09/21/19 2041 09/23/19 1141 09/23/19 1550 09/24/19 0046 09/24/19 0342 09/24/19 0531  HGB 14.1   < > 14.4  --   --   --  13.9  HCT 41.2  --  42.0  --   --   --  40.2  PLT 247  --  308  --   --   --  302  APTT  --   --   --   --  83*  --   --   LABPROT  --   --   --   --  13.1  --   --   INR  --   --   --   --  1.0  --   --   HEPARINUNFRC  --   --   --   --  0.48  --  0.23*  CREATININE 0.70  --  0.83  --   --   --   --   TROPONINIHS  --   --  11 1,067*  --  2,125*  --    < > = values in this interval not displayed.    Estimated Creatinine Clearance: 82 mL/min (by C-G formula based on SCr of 0.83 mg/dL).   Assessment: 39 yo F presents with NSTEMI. Pharmacy dosing heparin  -heparin level = 0.23 -CBC stable  Goal of Therapy:  Heparin level 0.3-0.7 units/ml Monitor platelets by anticoagulation protocol: Yes   Plan:  -Increase heparin to 950 units/hr -Heparin level in 6 hours and daily wth CBC daily  Harland German, PharmD Clinical Pharmacist **Pharmacist phone directory can  now be found on [email or web address] ([email or web address]).  Listed under [facility].

## 2019-09-24 NOTE — ED Notes (Signed)
MD notified of elevated HR.

## 2019-09-24 NOTE — ED Notes (Signed)
Pt has sustained ST on monitor with rate 140's. Dr. Preston Fleeting made aware and to bedside. Pt denies chest pain at this time.

## 2019-09-24 NOTE — ED Notes (Signed)
Pt reports feeling better after metoprolol. Pt oxygen sat decreased 88% on RA while sleeping. Pt placed on oxgyen 2 L via [place].

## 2019-09-24 NOTE — Progress Notes (Signed)
ANTICOAGULATION CONSULT NOTE  Pharmacy Consult for Heparin Indication: chest pain/ACS  Allergies  Allergen Reactions  . Penicillins Anaphylaxis    Has patient had a PCN reaction causing immediate rash, facial/tongue/throat swelling, SOB or lightheadedness with hypotension:Yes Has patient had a PCN reaction causing severe rash involving mucus membranes or skin necrosis:unknown Has patient had a PCN reaction that required hospitalization:Yes Has patient had a PCN reaction occurring within the last 10 years:No If all of the above answers are "NO", then may proceed with Cephalosporin use.     Patient Measurements: Height: 5' (152.4 cm) Weight: 77.3 kg (170 lb 8 oz) IBW/kg (Calculated) : 45.5 Heparin Dosing Weight: 63.2 kg  Vital Signs: Temp: 98.8 F (37.1 C) (04/17 0758) Temp Source: Oral (04/17 0758) BP: 129/90 (04/17 0758) Pulse Rate: 133 (04/17 1400)  Labs: Recent Labs    09/21/19 2041 09/21/19 2041 09/23/19 1141 09/23/19 1550 09/24/19 0046 09/24/19 0342 09/24/19 0531 09/24/19 0919 09/24/19 1522  HGB 14.1   < > 14.4  --   --   --  13.9  --   --   HCT 41.2  --  42.0  --   --   --  40.2  --   --   PLT 247  --  308  --   --   --  302  --   --   APTT  --   --   --   --  83*  --   --   --   --   LABPROT  --   --   --   --  13.1  --   --   --   --   INR  --   --   --   --  1.0  --   --   --   --   HEPARINUNFRC  --   --   --   --  0.48  --  0.23*  --  0.32  CREATININE 0.70  --  0.83  --   --   --   --  0.78  --   TROPONINIHS  --   --  11 1,067*  --  2,125*  --   --   --    < > = values in this interval not displayed.    Estimated Creatinine Clearance: 85 mL/min (by C-G formula based on SCr of 0.78 mg/dL).   Assessment: 39 yo F presents with NSTEMI. Pharmacy dosing heparin  -heparin level = 0.32 -CBC stable  Goal of Therapy:  Heparin level 0.3-0.7 units/ml Monitor platelets by anticoagulation protocol: Yes   Plan:  -Increase heparin to 1000 units/hr -Heparin  level daily wth CBC daily  Sheppard Coil PharmD., BCPS Clinical Pharmacist 09/24/2019 4:52 PM

## 2019-09-25 ENCOUNTER — Inpatient Hospital Stay (HOSPITAL_COMMUNITY): Payer: BC Managed Care – PPO

## 2019-09-25 DIAGNOSIS — I214 Non-ST elevation (NSTEMI) myocardial infarction: Secondary | ICD-10-CM

## 2019-09-25 DIAGNOSIS — E669 Obesity, unspecified: Secondary | ICD-10-CM | POA: Diagnosis present

## 2019-09-25 LAB — CBC
HCT: 43 % (ref 36.0–46.0)
Hemoglobin: 14.9 g/dL (ref 12.0–15.0)
MCH: 31.1 pg (ref 26.0–34.0)
MCHC: 34.7 g/dL (ref 30.0–36.0)
MCV: 89.8 fL (ref 80.0–100.0)
Platelets: 279 10*3/uL (ref 150–400)
RBC: 4.79 MIL/uL (ref 3.87–5.11)
RDW: 12.9 % (ref 11.5–15.5)
WBC: 15.4 10*3/uL — ABNORMAL HIGH (ref 4.0–10.5)
nRBC: 0 % (ref 0.0–0.2)

## 2019-09-25 LAB — ECHOCARDIOGRAM COMPLETE
Height: 60 in
Weight: 2731.2 oz

## 2019-09-25 LAB — BASIC METABOLIC PANEL
Anion gap: 10 (ref 5–15)
BUN: 6 mg/dL (ref 6–20)
CO2: 23 mmol/L (ref 22–32)
Calcium: 8.9 mg/dL (ref 8.9–10.3)
Chloride: 105 mmol/L (ref 98–111)
Creatinine, Ser: 0.75 mg/dL (ref 0.44–1.00)
GFR calc Af Amer: >60 mL/min (ref >60)
GFR calc non Af Amer: >60 mL/min (ref >60)
Glucose, Bld: 119 mg/dL — ABNORMAL HIGH (ref 70–99)
Potassium: 3.5 mmol/L (ref 3.5–5.1)
Sodium: 138 mmol/L (ref 135–145)

## 2019-09-25 LAB — PHOSPHORUS: Phosphorus: 1.5 mg/dL — ABNORMAL LOW (ref 2.5–4.6)

## 2019-09-25 LAB — TROPONIN I (HIGH SENSITIVITY): Troponin I (High Sensitivity): 644 ng/L (ref <18)

## 2019-09-25 LAB — HEPARIN LEVEL (UNFRACTIONATED)
Heparin Unfractionated: 0.29 IU/mL — ABNORMAL LOW (ref 0.30–0.70)
Heparin Unfractionated: 0.3 IU/mL (ref 0.30–0.70)

## 2019-09-25 LAB — TSH: TSH: 1.483 u[IU]/mL (ref 0.350–4.500)

## 2019-09-25 LAB — MAGNESIUM: Magnesium: 2.3 mg/dL (ref 1.7–2.4)

## 2019-09-25 MED ORDER — METOPROLOL TARTRATE 5 MG/5ML IV SOLN
2.5000 mg | Freq: Four times a day (QID) | INTRAVENOUS | Status: DC
  Administered 2019-09-25: 2.5 mg via INTRAVENOUS

## 2019-09-25 MED ORDER — METOPROLOL SUCCINATE ER 25 MG PO TB24
25.0000 mg | ORAL_TABLET | Freq: Every day | ORAL | Status: DC
  Administered 2019-09-25 – 2019-09-26 (×2): 25 mg via ORAL
  Filled 2019-09-25: qty 1

## 2019-09-25 MED ORDER — ASPIRIN 81 MG PO CHEW
81.0000 mg | CHEWABLE_TABLET | ORAL | Status: AC
  Administered 2019-09-26: 81 mg via ORAL
  Filled 2019-09-25: qty 1

## 2019-09-25 MED ORDER — SODIUM CHLORIDE 0.9% FLUSH: 3.0000 mL | INTRAVENOUS | Status: DC | PRN

## 2019-09-25 MED ORDER — SODIUM CHLORIDE 0.9% FLUSH
3.0000 mL | Freq: Two times a day (BID) | INTRAVENOUS | Status: DC
  Administered 2019-09-25: 3 mL via INTRAVENOUS

## 2019-09-25 MED ORDER — METOPROLOL SUCCINATE ER 25 MG PO TB24
25.0000 mg | ORAL_TABLET | Freq: Once | ORAL | Status: AC
  Administered 2019-09-25: 25 mg via ORAL
  Filled 2019-09-25: qty 1

## 2019-09-25 MED ORDER — SODIUM CHLORIDE 0.9 % IV SOLN: 250.0000 mL | INTRAVENOUS | Status: DC | PRN

## 2019-09-25 MED ORDER — SODIUM CHLORIDE 0.9 % IV SOLN: INTRAVENOUS | Status: DC

## 2019-09-25 NOTE — Progress Notes (Signed)
Progress Note  Patient Name: Tina Colon Date of Encounter: 09/25/2019  Attending physician: Hollice Espy, MD Primary care provider: Georgina Quint, MD Primary Cardiologist:  Consultant:Seville Brick Odis Hollingshead  Subjective: Tina Colon is a 39 y.o. female whose past medical history and cardiac risk factors include: Recovered cardiomyopathy, EtOH dependence, history of pancreatitis, gastritis, obesity, former smoker.. She presented to the hospital with a chief complaint of " abdominal pain, nausea, vomiting."  Patient seen and examined at bedside at approximately 10:45 AM.  Patient is accompanied by her husband as well. No events overnight. Patient denies any chest pain at rest or with effort related activities, no shortness of breath at rest or with effort related activities, no orthopnea, paroxysmal nocturnal dyspnea or lower extremity swelling. Epigastric discomfort is also improved. Patient was ambulating on the floor yesterday. Patient refuses to be on statin therapy. Patient states that she was nauseated yesterday unable to keep food down.  But was able to have breakfast this morning Case discussed and reviewed with her nurse.  Objective: Vital Signs in the last 24 hours: Temp:  [98.8 F (37.1 C)-99.8 F (37.7 C)] 98.8 F (37.1 C) (04/18 0501) Pulse Rate:  [119-148] 134 (04/18 0501) Resp:  [16-18] 18 (04/18 0501) BP: (97-105)/(70-84) 105/84 (04/18 0501) SpO2:  [94 %-100 %] 100 % (04/18 0501) Weight:  [77.4 kg] 77.4 kg (04/18 0501)  Intake/Output from previous day: 04/17 0701 - 04/18 0700 In: 607.4 [P.O.:360; I.V.:247.4] Out: 1700 [Urine:1700]  Weights:  Filed Weights   09/23/19 1130 09/24/19 0758 09/25/19 0501  Weight: 78 kg 77.3 kg 77.4 kg    Telemetry: Personally reviewed.  Review of Systems  Constitution: Negative for chills and fever.  HENT: Negative for ear discharge, ear pain and nosebleeds.   Eyes: Negative for blurred vision and discharge.   Cardiovascular: Negative for chest pain, claudication, dyspnea on exertion, leg swelling, near-syncope, orthopnea, palpitations, paroxysmal nocturnal dyspnea and syncope.  Respiratory: Negative for cough and shortness of breath.   Endocrine: Negative for polydipsia, polyphagia and polyuria.  Hematologic/Lymphatic: Negative for bleeding problem.  Skin: Negative for flushing and nail changes.  Musculoskeletal: Negative for muscle cramps, muscle weakness and myalgias.  Gastrointestinal: Positive for nausea (improving). Negative for abdominal pain, dysphagia, hematemesis, hematochezia, melena and vomiting.  Neurological: Negative for dizziness, focal weakness and light-headedness.   Physical examination: CONSTITUTIONAL: Well-developed and well-nourished. No acute distress.  SKIN: Skin is warm and dry. No rash noted. No cyanosis. No pallor. No jaundice HEAD: Normocephalic and atraumatic.  EYES: No scleral icterus MOUTH/THROAT: Moist oral membranes.  NECK: No JVD present. No thyromegaly noted. No carotid bruits  LYMPHATIC: No visible cervical adenopathy.  CHEST Normal respiratory effort. No intercostal retractions  LUNGS: Clear to auscultation bilaterally.  No stridor. No wheezes. No rales.  CARDIOVASCULAR:  Regular, tachycardic, positive S1-S2, soft holosystolic murmur heard at the apex, no rubs or gallops appreciated. ABDOMINAL: Soft, nontender, nondistended, positive bowel sounds in upper quadrants.  No apparent ascites.  EXTREMITIES: No peripheral edema  HEMATOLOGIC: No significant bruising NEUROLOGIC: Oriented to person, place, and time. Nonfocal. Normal muscle tone.  PSYCHIATRIC: Normal mood and affect. Normal behavior. Cooperative  Lab Results: Hematology Recent Labs  Lab 09/23/19 1141 09/24/19 0531 09/25/19 0331  WBC 12.7* 18.0* 15.4*  RBC 4.72 4.46 4.79  HGB 14.4 13.9 14.9  HCT 42.0 40.2 43.0  MCV 89.0 90.1 89.8  MCH 30.5 31.2 31.1  MCHC 34.3 34.6 34.7  RDW 12.7 13.1 12.9   PLT 308 302 279  Chemistry Recent Labs  Lab 09/21/19 2041 09/21/19 2041 09/23/19 1141 09/24/19 0919 09/25/19 0331  NA 140   < > 138 138 138  K 3.1*   < > 2.6* 2.7* 3.5  CL 102   < > 100 100 105  CO2 23   < > 22 23 23   GLUCOSE 180*   < > 142* 135* 119*  BUN 9   < > 15 7 6   CREATININE 0.70   < > 0.83 0.78 0.75  CALCIUM 9.7   < > 9.9 9.0 8.9  PROT 7.9  --  8.0  --   --   ALBUMIN 4.5  --  4.4  --   --   AST 29  --  26  --   --   ALT 21  --  22  --   --   ALKPHOS 42  --  43  --   --   BILITOT 0.3  --  0.4  --   --   GFRNONAA >60   < > >60 >60 >60  GFRAA >60   < > >60 >60 >60  ANIONGAP 15   < > 16* 15 10   < > = values in this interval not displayed.     Hepatic Function Panel  Recent Labs    11/23/18 1800 09/21/19 2041 09/23/19 1141  PROT 7.8 7.9 8.0  ALBUMIN 4.5 4.5 4.4  AST 30 29 26   ALT 24 21 22   ALKPHOS 45 42 43  BILITOT 0.8 0.3 0.4   Cardiac Panel (last 3 results) Recent Labs    09/23/19 1141 09/23/19 1550 09/24/19 0342  TROPONINIHS 11 1,067* 2,125*    BNP Recent Labs  Lab 09/23/19 1141  BNP 101.2*    Hemoglobin A1c:  Lab Results  Component Value Date   HGBA1C 5.5 09/24/2019   MPG 111.15 09/24/2019    TSH No results for input(s): TSH in the last 8760 hours.  Lipid Panel     Component Value Date/Time   CHOL 147 09/24/2019 0919   CHOL 144 10/02/2017 1031   TRIG 130 09/24/2019 0919   HDL 61 09/24/2019 0919   HDL 56 10/02/2017 1031   CHOLHDL 2.4 09/24/2019 0919   VLDL 26 09/24/2019 0919   LDLCALC 60 09/24/2019 0919   LDLCALC 64 10/02/2017 1031    Imaging: CT Angio Chest PE W/Cm &/Or Wo Cm  Result Date: 09/23/2019 CLINICAL DATA:  Shortness of breath. History of congestive heart failure. Abdominal pain for 1 year with nausea and vomiting. EXAM: CT ANGIOGRAPHY CHEST WITH CONTRAST TECHNIQUE: Multidetector CT imaging of the chest was performed using the standard protocol during bolus administration of intravenous contrast. Multiplanar CT  image reconstructions and MIPs were obtained to evaluate the vascular anatomy. CONTRAST:  15mL OMNIPAQUE IOHEXOL 350 MG/ML SOLN COMPARISON:  12/23/2017 FINDINGS: Cardiovascular: No filling defect is identified in the pulmonary arterial tree to suggest pulmonary embolus. Mildly prominent left ventricle. Mediastinum/Nodes: Unremarkable Lungs/Pleura: Unremarkable Upper Abdomen: Unremarkable Musculoskeletal: Thoracic spondylosis. Review of the MIP images confirms the above findings. IMPRESSION: 1. No filling defect is identified in the pulmonary arterial tree to suggest pulmonary embolus. 2. Mildly prominent left ventricle. Electronically Signed   By: 10/04/2017 M.D.   On: 09/23/2019 19:08   DG Chest Port 1 View  Result Date: 09/23/2019 CLINICAL DATA:  Chest pain. EXAM: PORTABLE CHEST 1 VIEW COMPARISON:  December 23, 2017. FINDINGS: The heart size and mediastinal contours are within normal limits. Both lungs are clear.  No pneumothorax or pleural effusion is noted. The visualized skeletal structures are unremarkable. IMPRESSION: No active disease. Electronically Signed   By: Marijo Conception M.D.   On: 09/23/2019 13:55   CARDIAC DATABASE: EKG: 08/19/2019: Sinus rhythm 70 bpm. Nonspecific T wave inversion inferior lead. 09/24/2019: Sinus tachycardia, ventricular rate 234 bpm, left axis deviation, indeterminate anteroseptal infarct, prolonged QT.  Cardiac MRI 07/23/2018: 1. Mild left ventricular enlargement with borderline LVEF 50%. 2. There is no late gadolinium enhancement in the left ventricular myocardium. 3. Normal right ventricular chamber size and function, RVEF 56%.  Echocardiogram 05/19/2018:1. Left ventricle cavity is mildly dilated. Severe decrease in global wall motion. Visual EF is 20-25%. 2. Mild (Grade I) mitral regurgitation. 3. Mild tricuspid regurgitation. No evidence of pulmonary Hypertension. Heart failure with reduced ejection fraction, NYHA class II (I50.20)    Hospital  echocardiogram 12/24/2017: Study Conclusions: - Left ventricle: The cavity size was moderately to severelydilated. Systolic function was severely reduced. The estimatedejection fraction was 15-20%. Diffuse hypokinesis. Profound diffuse hypokinesis throughout, with near akinesis of septal and anterior walls. - Aortic valve: Transvalvular velocity was within the normal range.There was no stenosis. There was no regurgitation. - Mitral valve: There was mild regurgitation. - Left atrium: The atrium was moderately to severely dilated. - Right ventricle: The cavity size was mildly dilated. Wall thickness was normal. Systolic function was normal. - Tricuspid valve: There was trivial regurgitation. - Pulmonic valve: There was no significant regurgitation. - Pericardium, extracardiac: A trivial pericardial effusion was identified posterior to the heart.  Impressions: - Severely reduced ejection fraction of 15-20%. Global severehypokinesis with near akinesis of anterior and septal walls.Severely dilated left atrium, mild MR. No significant TR toestimate filling pressures.  CTA coronary 03/23/2018: Calcium score 0. No evidence of CAD.  Scheduled Meds: . aspirin  81 mg Oral Daily  . atorvastatin  40 mg Oral Daily  . capsaicin   Topical Once  . folic acid  1 mg Oral Daily  . LORazepam  0-4 mg Intravenous Q6H   Followed by  . [START ON 09/26/2019] LORazepam  0-4 mg Intravenous Q12H  . metoprolol succinate  25 mg Oral Daily  . metoprolol succinate  25 mg Oral Once  . metoprolol tartrate  2.5 mg Intravenous Q6H  . multivitamin with minerals  1 tablet Oral Daily  . norgestimate-ethinyl estradiol  1 tablet Oral Daily  . potassium chloride SA  20 mEq Oral Daily  . sacubitril-valsartan  1 tablet Oral BID  . spironolactone  25 mg Oral Daily  . thiamine  100 mg Oral Daily   Or  . thiamine  100 mg Intravenous Daily    Continuous Infusions: . sodium chloride    . heparin 1,100 Units/hr  (09/25/19 0456)    PRN Meds: sodium chloride, acetaminophen, alum & mag hydroxide-simeth, LORazepam **OR** LORazepam, metoprolol tartrate, scopolamine   IMPRESSION & RECOMMENDATIONS: Tina Colon is a 39 y.o. female whose past medical history and cardiovascular risk factors include: Recovered cardiomyopathy, EtOH dependence, history of pancreatitis, gastritis, obesity, former smoker.  Non-STEMI most likely secondary to supply demand ischemia. Epigastric pain: Improving Sinus tachycardia History of nonischemic cardiomyopathy, recovered based on last imaging study Family history of premature coronary artery disease Prolonged QT, most likely secondary to electrolyte imbalance Obesity, due to excess calories, Body mass index is 33.3 kg/m.  Former smoker. Marijuana use. Alcohol dependence Intractable nausea and vomiting: improving Hypokalemia: resolved  Hypomagnesemia, resolved History of pancreatitis.  Patient's presentation of epigastric discomfort is most  likely secondary to excessive alcohol use causing her to have intractable nausea and vomiting.  However, in the setting of uptrending cardiac biomarkers cannot rule out underlying cardiac ischemia.  Currently patient is chest pain-free.  Currently on IV heparin drip for non-STEMI.  Echocardiogram pending.    She has a history of severely reduced left ventricular systolic function which recovered based on the most recent cardiac MRI.  She is currently on heart failure medications and not in congestive heart failure.  She is educated extensively on the importance of complete alcohol cessation and use of illicit drugs.  Continue telemetry   Continue guideline directed medical therapy. Will give one dose extra of Toprol XL and if able to tolerate will increase her Toprol XL dose. Check TSH as well.   Given her history of cardiomyopathy, epigastric discomfort, elevated troponins, discussed possibly going for left and right  heart catheterization during this hospitalization.  Patient and husband are in agreement.  The left and right heart catheterization procedure was explained to the patient in detail. The indication, alternatives, risks and benefits were reviewed. Complications including but not limited to bleeding, infection, acute kidney injury, blood transfusion, heart rhythm disturbances, contrast (dye) reaction, damage to the arteries or nerves in the legs or hands, cerebrovascular accident, myocardial infarction, need for emergent bypass surgery, blood clots in the legs, possible need for emergent blood transfusion, and rarely death were reviewed and discussed with the patient. The patient and husband voices understanding and wishes to proceed.   Echocardiogram is ordered, results pending  Patient's questions and concerns were addressed to her satisfaction. She voices understanding of the instructions provided during this encounter.   This note was created using a voice recognition software as a result there may be grammatical errors inadvertently enclosed that do not reflect the nature of this encounter. Every attempt is made to correct such errors.  Eliah Marquard, DO, FACC [facility] Office: [phone] 09/25/2019, 12:39 PM     

## 2019-09-25 NOTE — Progress Notes (Signed)
ANTICOAGULATION CONSULT NOTE  Pharmacy Consult for Heparin Indication: chest pain/ACS  Allergies  Allergen Reactions  . Penicillins Anaphylaxis    Has patient had a PCN reaction causing immediate rash, facial/tongue/throat swelling, SOB or lightheadedness with hypotension:Yes Has patient had a PCN reaction causing severe rash involving mucus membranes or skin necrosis:unknown Has patient had a PCN reaction that required hospitalization:Yes Has patient had a PCN reaction occurring within the last 10 years:No If all of the above answers are "NO", then may proceed with Cephalosporin use.     Patient Measurements: Height: 5' (152.4 cm) Weight: 77.3 kg (170 lb 8 oz) IBW/kg (Calculated) : 45.5 Heparin Dosing Weight: 63.2 kg  Vital Signs: Temp: 99 F (37.2 C) (04/17 2348) Temp Source: Axillary (04/17 2348) BP: 97/73 (04/18 0227) Pulse Rate: 119 (04/18 0227)  Labs: Recent Labs     0000 09/23/19 1141 09/23/19 1550 09/24/19 0046 09/24/19 0046 09/24/19 0342 09/24/19 0531 09/24/19 0919 09/24/19 1522 09/25/19 0331  HGB   < > 14.4  --   --   --   --  13.9  --   --  14.9  HCT  --  42.0  --   --   --   --  40.2  --   --  43.0  PLT  --  308  --   --   --   --  302  --   --  279  APTT  --   --   --  83*  --   --   --   --   --   --   LABPROT  --   --   --  13.1  --   --   --   --   --   --   INR  --   --   --  1.0  --   --   --   --   --   --   HEPARINUNFRC  --   --   --  0.48   < >  --  0.23*  --  0.32 0.29*  CREATININE  --  0.83  --   --   --   --   --  0.78  --   --   TROPONINIHS  --  11 1,067*  --   --  2,125*  --   --   --   --    < > = values in this interval not displayed.    Estimated Creatinine Clearance: 85 mL/min (by C-G formula based on SCr of 0.78 mg/dL).   Assessment: 39 yo F presents with NSTEMI. Pharmacy dosing heparin  Heparin level down to slightly subtherapeutic (0.29) on gtt at 1000 units/hr. No issues with line or bleeding reported per RN.  Goal of  Therapy:  Heparin level 0.3-0.7 units/ml Monitor platelets by anticoagulation protocol: Yes   Plan:  -Increase heparin to 1100 units/hr -F/u 6 hr heparin level  Christoper Fabian, PharmD, BCPS Please see amion for complete clinical pharmacist phone list 09/25/2019 4:50 AM

## 2019-09-25 NOTE — Progress Notes (Signed)
ANTICOAGULATION CONSULT NOTE  Pharmacy Consult for Heparin Indication: chest pain/ACS  Allergies  Allergen Reactions  . Penicillins Anaphylaxis    Has patient had a PCN reaction causing immediate rash, facial/tongue/throat swelling, SOB or lightheadedness with hypotension:Yes Has patient had a PCN reaction causing severe rash involving mucus membranes or skin necrosis:unknown Has patient had a PCN reaction that required hospitalization:Yes Has patient had a PCN reaction occurring within the last 10 years:No If all of the above answers are "NO", then may proceed with Cephalosporin use.     Patient Measurements: Height: 5' (152.4 cm) Weight: 77.4 kg (170 lb 11.2 oz) IBW/kg (Calculated) : 45.5 Heparin Dosing Weight: 63.2 kg  Vital Signs: Temp: 98.8 F (37.1 C) (04/18 0501) Temp Source: Oral (04/18 0501) BP: 105/84 (04/18 0501) Pulse Rate: 134 (04/18 0501)  Labs: Recent Labs     0000 09/23/19 1141 09/23/19 1550 09/24/19 0046 09/24/19 0046 09/24/19 0342 09/24/19 0531 09/24/19 0531 09/24/19 0919 09/24/19 1522 09/25/19 0331 09/25/19 1106  HGB   < > 14.4  --   --   --   --  13.9  --   --   --  14.9  --   HCT  --  42.0  --   --   --   --  40.2  --   --   --  43.0  --   PLT  --  308  --   --   --   --  302  --   --   --  279  --   APTT  --   --   --  83*  --   --   --   --   --   --   --   --   LABPROT  --   --   --  13.1  --   --   --   --   --   --   --   --   INR  --   --   --  1.0  --   --   --   --   --   --   --   --   HEPARINUNFRC  --   --   --  0.48   < >  --  0.23*   < >  --  0.32 0.29* 0.30  CREATININE  --  0.83  --   --   --   --   --   --  0.78  --  0.75  --   TROPONINIHS  --  11 1,067*  --   --  2,125*  --   --   --   --   --   --    < > = values in this interval not displayed.    Estimated Creatinine Clearance: 85.2 mL/min (by C-G formula based on SCr of 0.75 mg/dL).   Assessment: 39 yo F presents with NSTEMI. Pharmacy dosing heparin  -heparin level at  the low end of goal  Goal of Therapy:  Heparin level 0.3-0.7 units/ml Monitor platelets by anticoagulation protocol: Yes   Plan:  -Increase heparin to 1150 units/hr -Daily heparin level and CBC  Harland German, PharmD Clinical Pharmacist **Pharmacist phone directory can now be found on [email or web address] ([email or web address]).  Listed under [facility].

## 2019-09-25 NOTE — H&P (View-Only) (Signed)
Progress Note  Patient Name: Tina Colon Date of Encounter: 09/25/2019  Attending physician: Hollice Espy, MD Primary care provider: Georgina Quint, MD Primary Cardiologist:  Consultant:Rogerick Baldwin Odis Hollingshead  Subjective: Tina Colon is a 39 y.o. female whose past medical history and cardiac risk factors include: Recovered cardiomyopathy, EtOH dependence, history of pancreatitis, gastritis, obesity, former smoker.. She presented to the hospital with a chief complaint of " abdominal pain, nausea, vomiting."  Patient seen and examined at bedside at approximately 10:45 AM.  Patient is accompanied by her husband as well. No events overnight. Patient denies any chest pain at rest or with effort related activities, no shortness of breath at rest or with effort related activities, no orthopnea, paroxysmal nocturnal dyspnea or lower extremity swelling. Epigastric discomfort is also improved. Patient was ambulating on the floor yesterday. Patient refuses to be on statin therapy. Patient states that she was nauseated yesterday unable to keep food down.  But was able to have breakfast this morning Case discussed and reviewed with her nurse.  Objective: Vital Signs in the last 24 hours: Temp:  [98.8 F (37.1 C)-99.8 F (37.7 C)] 98.8 F (37.1 C) (04/18 0501) Pulse Rate:  [119-148] 134 (04/18 0501) Resp:  [16-18] 18 (04/18 0501) BP: (97-105)/(70-84) 105/84 (04/18 0501) SpO2:  [94 %-100 %] 100 % (04/18 0501) Weight:  [77.4 kg] 77.4 kg (04/18 0501)  Intake/Output from previous day: 04/17 0701 - 04/18 0700 In: 607.4 [P.O.:360; I.V.:247.4] Out: 1700 [Urine:1700]  Weights:  Filed Weights   09/23/19 1130 09/24/19 0758 09/25/19 0501  Weight: 78 kg 77.3 kg 77.4 kg    Telemetry: Personally reviewed.  Review of Systems  Constitution: Negative for chills and fever.  HENT: Negative for ear discharge, ear pain and nosebleeds.   Eyes: Negative for blurred vision and discharge.   Cardiovascular: Negative for chest pain, claudication, dyspnea on exertion, leg swelling, near-syncope, orthopnea, palpitations, paroxysmal nocturnal dyspnea and syncope.  Respiratory: Negative for cough and shortness of breath.   Endocrine: Negative for polydipsia, polyphagia and polyuria.  Hematologic/Lymphatic: Negative for bleeding problem.  Skin: Negative for flushing and nail changes.  Musculoskeletal: Negative for muscle cramps, muscle weakness and myalgias.  Gastrointestinal: Positive for nausea (improving). Negative for abdominal pain, dysphagia, hematemesis, hematochezia, melena and vomiting.  Neurological: Negative for dizziness, focal weakness and light-headedness.   Physical examination: CONSTITUTIONAL: Well-developed and well-nourished. No acute distress.  SKIN: Skin is warm and dry. No rash noted. No cyanosis. No pallor. No jaundice HEAD: Normocephalic and atraumatic.  EYES: No scleral icterus MOUTH/THROAT: Moist oral membranes.  NECK: No JVD present. No thyromegaly noted. No carotid bruits  LYMPHATIC: No visible cervical adenopathy.  CHEST Normal respiratory effort. No intercostal retractions  LUNGS: Clear to auscultation bilaterally.  No stridor. No wheezes. No rales.  CARDIOVASCULAR:  Regular, tachycardic, positive S1-S2, soft holosystolic murmur heard at the apex, no rubs or gallops appreciated. ABDOMINAL: Soft, nontender, nondistended, positive bowel sounds in upper quadrants.  No apparent ascites.  EXTREMITIES: No peripheral edema  HEMATOLOGIC: No significant bruising NEUROLOGIC: Oriented to person, place, and time. Nonfocal. Normal muscle tone.  PSYCHIATRIC: Normal mood and affect. Normal behavior. Cooperative  Lab Results: Hematology Recent Labs  Lab 09/23/19 1141 09/24/19 0531 09/25/19 0331  WBC 12.7* 18.0* 15.4*  RBC 4.72 4.46 4.79  HGB 14.4 13.9 14.9  HCT 42.0 40.2 43.0  MCV 89.0 90.1 89.8  MCH 30.5 31.2 31.1  MCHC 34.3 34.6 34.7  RDW 12.7 13.1 12.9   PLT 308 302 279  Chemistry Recent Labs  Lab 09/21/19 2041 09/21/19 2041 09/23/19 1141 09/24/19 0919 09/25/19 0331  NA 140   < > 138 138 138  K 3.1*   < > 2.6* 2.7* 3.5  CL 102   < > 100 100 105  CO2 23   < > 22 23 23   GLUCOSE 180*   < > 142* 135* 119*  BUN 9   < > 15 7 6   CREATININE 0.70   < > 0.83 0.78 0.75  CALCIUM 9.7   < > 9.9 9.0 8.9  PROT 7.9  --  8.0  --   --   ALBUMIN 4.5  --  4.4  --   --   AST 29  --  26  --   --   ALT 21  --  22  --   --   ALKPHOS 42  --  43  --   --   BILITOT 0.3  --  0.4  --   --   GFRNONAA >60   < > >60 >60 >60  GFRAA >60   < > >60 >60 >60  ANIONGAP 15   < > 16* 15 10   < > = values in this interval not displayed.     Hepatic Function Panel  Recent Labs    11/23/18 1800 09/21/19 2041 09/23/19 1141  PROT 7.8 7.9 8.0  ALBUMIN 4.5 4.5 4.4  AST 30 29 26   ALT 24 21 22   ALKPHOS 45 42 43  BILITOT 0.8 0.3 0.4   Cardiac Panel (last 3 results) Recent Labs    09/23/19 1141 09/23/19 1550 09/24/19 0342  TROPONINIHS 11 1,067* 2,125*    BNP Recent Labs  Lab 09/23/19 1141  BNP 101.2*    Hemoglobin A1c:  Lab Results  Component Value Date   HGBA1C 5.5 09/24/2019   MPG 111.15 09/24/2019    TSH No results for input(s): TSH in the last 8760 hours.  Lipid Panel     Component Value Date/Time   CHOL 147 09/24/2019 0919   CHOL 144 10/02/2017 1031   TRIG 130 09/24/2019 0919   HDL 61 09/24/2019 0919   HDL 56 10/02/2017 1031   CHOLHDL 2.4 09/24/2019 0919   VLDL 26 09/24/2019 0919   LDLCALC 60 09/24/2019 0919   LDLCALC 64 10/02/2017 1031    Imaging: CT Angio Chest PE W/Cm &/Or Wo Cm  Result Date: 09/23/2019 CLINICAL DATA:  Shortness of breath. History of congestive heart failure. Abdominal pain for 1 year with nausea and vomiting. EXAM: CT ANGIOGRAPHY CHEST WITH CONTRAST TECHNIQUE: Multidetector CT imaging of the chest was performed using the standard protocol during bolus administration of intravenous contrast. Multiplanar CT  image reconstructions and MIPs were obtained to evaluate the vascular anatomy. CONTRAST:  15mL OMNIPAQUE IOHEXOL 350 MG/ML SOLN COMPARISON:  12/23/2017 FINDINGS: Cardiovascular: No filling defect is identified in the pulmonary arterial tree to suggest pulmonary embolus. Mildly prominent left ventricle. Mediastinum/Nodes: Unremarkable Lungs/Pleura: Unremarkable Upper Abdomen: Unremarkable Musculoskeletal: Thoracic spondylosis. Review of the MIP images confirms the above findings. IMPRESSION: 1. No filling defect is identified in the pulmonary arterial tree to suggest pulmonary embolus. 2. Mildly prominent left ventricle. Electronically Signed   By: 10/04/2017 M.D.   On: 09/23/2019 19:08   DG Chest Port 1 View  Result Date: 09/23/2019 CLINICAL DATA:  Chest pain. EXAM: PORTABLE CHEST 1 VIEW COMPARISON:  December 23, 2017. FINDINGS: The heart size and mediastinal contours are within normal limits. Both lungs are clear.  No pneumothorax or pleural effusion is noted. The visualized skeletal structures are unremarkable. IMPRESSION: No active disease. Electronically Signed   By: Marijo Conception M.D.   On: 09/23/2019 13:55   CARDIAC DATABASE: EKG: 08/19/2019: Sinus rhythm 70 bpm. Nonspecific T wave inversion inferior lead. 09/24/2019: Sinus tachycardia, ventricular rate 234 bpm, left axis deviation, indeterminate anteroseptal infarct, prolonged QT.  Cardiac MRI 07/23/2018: 1. Mild left ventricular enlargement with borderline LVEF 50%. 2. There is no late gadolinium enhancement in the left ventricular myocardium. 3. Normal right ventricular chamber size and function, RVEF 56%.  Echocardiogram 05/19/2018:1. Left ventricle cavity is mildly dilated. Severe decrease in global wall motion. Visual EF is 20-25%. 2. Mild (Grade I) mitral regurgitation. 3. Mild tricuspid regurgitation. No evidence of pulmonary Hypertension. Heart failure with reduced ejection fraction, NYHA class II (I50.20)    Hospital  echocardiogram 12/24/2017: Study Conclusions: - Left ventricle: The cavity size was moderately to severelydilated. Systolic function was severely reduced. The estimatedejection fraction was 15-20%. Diffuse hypokinesis. Profound diffuse hypokinesis throughout, with near akinesis of septal and anterior walls. - Aortic valve: Transvalvular velocity was within the normal range.There was no stenosis. There was no regurgitation. - Mitral valve: There was mild regurgitation. - Left atrium: The atrium was moderately to severely dilated. - Right ventricle: The cavity size was mildly dilated. Wall thickness was normal. Systolic function was normal. - Tricuspid valve: There was trivial regurgitation. - Pulmonic valve: There was no significant regurgitation. - Pericardium, extracardiac: A trivial pericardial effusion was identified posterior to the heart.  Impressions: - Severely reduced ejection fraction of 15-20%. Global severehypokinesis with near akinesis of anterior and septal walls.Severely dilated left atrium, mild MR. No significant TR toestimate filling pressures.  CTA coronary 03/23/2018: Calcium score 0. No evidence of CAD.  Scheduled Meds: . aspirin  81 mg Oral Daily  . atorvastatin  40 mg Oral Daily  . capsaicin   Topical Once  . folic acid  1 mg Oral Daily  . LORazepam  0-4 mg Intravenous Q6H   Followed by  . [START ON 09/26/2019] LORazepam  0-4 mg Intravenous Q12H  . metoprolol succinate  25 mg Oral Daily  . metoprolol succinate  25 mg Oral Once  . metoprolol tartrate  2.5 mg Intravenous Q6H  . multivitamin with minerals  1 tablet Oral Daily  . norgestimate-ethinyl estradiol  1 tablet Oral Daily  . potassium chloride SA  20 mEq Oral Daily  . sacubitril-valsartan  1 tablet Oral BID  . spironolactone  25 mg Oral Daily  . thiamine  100 mg Oral Daily   Or  . thiamine  100 mg Intravenous Daily    Continuous Infusions: . sodium chloride    . heparin 1,100 Units/hr  (09/25/19 0456)    PRN Meds: sodium chloride, acetaminophen, alum & mag hydroxide-simeth, LORazepam **OR** LORazepam, metoprolol tartrate, scopolamine   IMPRESSION & RECOMMENDATIONS: AYAKA ANDES is a 39 y.o. female whose past medical history and cardiovascular risk factors include: Recovered cardiomyopathy, EtOH dependence, history of pancreatitis, gastritis, obesity, former smoker.  Non-STEMI most likely secondary to supply demand ischemia. Epigastric pain: Improving Sinus tachycardia History of nonischemic cardiomyopathy, recovered based on last imaging study Family history of premature coronary artery disease Prolonged QT, most likely secondary to electrolyte imbalance Obesity, due to excess calories, Body mass index is 33.3 kg/m.  Former smoker. Marijuana use. Alcohol dependence Intractable nausea and vomiting: improving Hypokalemia: resolved  Hypomagnesemia, resolved History of pancreatitis.  Patient's presentation of epigastric discomfort is most  likely secondary to excessive alcohol use causing her to have intractable nausea and vomiting.  However, in the setting of uptrending cardiac biomarkers cannot rule out underlying cardiac ischemia.  Currently patient is chest pain-free.  Currently on IV heparin drip for non-STEMI.  Echocardiogram pending.    She has a history of severely reduced left ventricular systolic function which recovered based on the most recent cardiac MRI.  She is currently on heart failure medications and not in congestive heart failure.  She is educated extensively on the importance of complete alcohol cessation and use of illicit drugs.  Continue telemetry   Continue guideline directed medical therapy. Will give one dose extra of Toprol XL and if able to tolerate will increase her Toprol XL dose. Check TSH as well.   Given her history of cardiomyopathy, epigastric discomfort, elevated troponins, discussed possibly going for left and right  heart catheterization during this hospitalization.  Patient and husband are in agreement.  The left and right heart catheterization procedure was explained to the patient in detail. The indication, alternatives, risks and benefits were reviewed. Complications including but not limited to bleeding, infection, acute kidney injury, blood transfusion, heart rhythm disturbances, contrast (dye) reaction, damage to the arteries or nerves in the legs or hands, cerebrovascular accident, myocardial infarction, need for emergent bypass surgery, blood clots in the legs, possible need for emergent blood transfusion, and rarely death were reviewed and discussed with the patient. The patient and husband voices understanding and wishes to proceed.   Echocardiogram is ordered, results pending  Patient's questions and concerns were addressed to her satisfaction. She voices understanding of the instructions provided during this encounter.   This note was created using a voice recognition software as a result there may be grammatical errors inadvertently enclosed that do not reflect the nature of this encounter. Every attempt is made to correct such errors.  Tessa Lerner, DO, [facility] Office: [phone] 09/25/2019, 12:39 PM

## 2019-09-25 NOTE — Progress Notes (Addendum)
PROGRESS NOTE  Tina Colon MOQ:[id number] DOB: [birth year] DOA: 09/23/2019 PCP: Horald Pollen, MD  HPI/Recap of past 37 hours: 39 year old female past medical history of systolic CHF, pancreatitis, alcohol abuse and GERD presented to the emergency room on 4/17 morning with complaints of epigastric abdominal pain and with her last drink being 5 to 6 days prior.  The emergency room, noted to be tachycardic with a heart rate in the 140s oxygen saturations as low as 87% briefly requiring 2 L of nasal cannula oxygen, but weaned off, normal BNP, but troponins continued to trend upward from initially normal up to as high as 2100.  Patient suspected be having a non-STEMI.  CT angiogram negative for put pulmonary embolus.  Patient started on heparin drip and cardiology consulted.  Chest pain-free and transported to stepdown.  No events overnight.  Patient remains tachycardic, therefore unable to get echocardiogram.  Started on p.o. Lopressor.  This morning, she has no complaints and feeling a little bit better.  Seen by cardiology who plan to get echocardiogram and then cardiac cath on Monday, 4/19.  Addendum: Echocardiogram no significant cardiomyopathy with ejection fraction of 50-20%.  Await cardiology follow-up and likely plan for cath tomorrow.  Heart cath.  Will need diuresis and to avoid alcohol.  Assessment/Plan: Principal Problem:   NSTEMI (non-ST elevated myocardial infarction) [facility]): Currently pain-free.  On IV heparin.  Info by cardiology.  Plan for cath tomorrow.  Awaiting echocardiogram once her heart rate can come down. Active Problems:   Epigastric pain: Further be secondary to non-STEMI.   Hx of cardiomyopathy   Family history of premature coronary artery disease   Prolonged Q-T interval on ECG: Trying to avoid PPI.  Acute on chronic systolic heart failure: Cardiology following.    Class 1 obesity due to excess calories with serious comorbidity and body mass index (BMI) of  33.0 to 33.9 in adult   Former smoker: States that she no longer smokes.  Declined nicotine patch.    Marijuana use: Says she continues to use marijuana, counseled.    Alcohol use: States last drink was 6 days ago.  Out of window for withdrawals although will watch closely.  Tachycardia: In part due to DTs, pain, anxiety: Have started p.o. Lopressor and scheduled IV Lopressor.    History of pancreatitis: Stable, no evidence of pancreatitis during this admission.    Obesity (BMI 30-39.9): Patient meets criteria with BMI greater than 30   Code Status: Full code  Family Communication: Declined for me to call her husband  Disposition Plan: Dependent upon cath tomorrow.  Potential discharge in a few days, once cleared by cardiology   Consultants:  Cardiology  Procedures:  Echocardiogram pending  Cardiac catheterization pending  Antimicrobials:  None  DVT prophylaxis: IV heparin   Objective: Vitals:   09/25/19 0227 09/25/19 0501  BP: 97/73 105/84  Pulse: (!) 119 (!) 134  Resp:  18  Temp:  98.8 F (37.1 C)  SpO2:  100%    Intake/Output Summary (Last 24 hours) at 09/25/2019 1232 Last data filed at 09/25/2019 3546 Gross per 24 hour  Intake 607.39 ml  Output 1700 ml  Net -1092.61 ml   Filed Weights   09/23/19 1130 09/24/19 0758 09/25/19 0501  Weight: 78 kg 77.3 kg 77.4 kg   Body mass index is 33.34 kg/m.  Exam:   General: Alert and oriented x3, no acute distress  HEENT: Cephalic and atraumatic, mucous membranes are moist  Cardiovascular: Regular rhythm, tachycardic, S1-S2  Respiratory: Clear to auscultation bilaterally  Abdomen: Soft, nontender, nondistended, positive bowel sounds  Musculoskeletal: No clubbing or cyanosis, trace pitting edema  Neuro: No focal deficits  Skin: No skin breaks, tears or lesions  Psychiatry: Appropriate, no evidence of psychoses   Data Reviewed: CBC: Recent Labs  Lab 09/21/19 2041 09/23/19 1141 09/24/19 0531  09/25/19 0331  WBC 11.2* 12.7* 18.0* 15.4*  HGB 14.1 14.4 13.9 14.9  HCT 41.2 42.0 40.2 43.0  MCV 90.7 89.0 90.1 89.8  PLT 247 308 302 279   Basic Metabolic Panel: Recent Labs  Lab 09/21/19 2041 09/23/19 1141 09/24/19 0919 09/25/19 0331  NA 140 138 138 138  K 3.1* 2.6* 2.7* 3.5  CL 102 100 100 105  CO2 23 22 23 23   GLUCOSE 180* 142* 135* 119*  BUN 9 15 7 6   CREATININE 0.70 0.83 0.78 0.75  CALCIUM 9.7 9.9 9.0 8.9  MG 1.6* 2.0  --  2.3  PHOS  --   --   --  1.5*   GFR: Estimated Creatinine Clearance: 85.2 mL/min (by C-G formula based on SCr of 0.75 mg/dL). Liver Function Tests: Recent Labs  Lab 09/21/19 2041 09/23/19 1141  AST 29 26  ALT 21 22  ALKPHOS 42 43  BILITOT 0.3 0.4  PROT 7.9 8.0  ALBUMIN 4.5 4.4   Recent Labs  Lab 09/21/19 2041 09/23/19 1141  LIPASE 23 32   No results for input(s): AMMONIA in the last 168 hours. Coagulation Profile: Recent Labs  Lab 09/24/19 0046  INR 1.0   Cardiac Enzymes: No results for input(s): CKTOTAL, CKMB, CKMBINDEX, TROPONINI in the last 168 hours. BNP (last 3 results) No results for input(s): PROBNP in the last 8760 hours. HbA1C: Recent Labs    09/24/19 0919  HGBA1C 5.5   CBG: No results for input(s): GLUCAP in the last 168 hours. Lipid Profile: Recent Labs    09/24/19 0919  CHOL 147  HDL 61  LDLCALC 60  TRIG 130  CHOLHDL 2.4   Thyroid Function Tests: No results for input(s): TSH, T4TOTAL, FREET4, T3FREE, THYROIDAB in the last 72 hours. Anemia Panel: No results for input(s): VITAMINB12, FOLATE, FERRITIN, TIBC, IRON, RETICCTPCT in the last 72 hours. Urine analysis:    Component Value Date/Time   COLORURINE BROWN (A) 09/23/2019 1248   APPEARANCEUR CLEAR 09/23/2019 1248   LABSPEC >1.030 (H) 09/23/2019 1248   PHURINE 6.0 09/23/2019 1248   GLUCOSEU NEGATIVE 09/23/2019 1248   HGBUR LARGE (A) 09/23/2019 1248   BILIRUBINUR SMALL (A) 09/23/2019 1248   BILIRUBINUR neg 11/29/2013 1015   KETONESUR 40 (A)  09/23/2019 1248   PROTEINUR >300 (A) 09/23/2019 1248   UROBILINOGEN 0.2 11/05/2014 1335   NITRITE NEGATIVE 09/23/2019 1248   LEUKOCYTESUR NEGATIVE 09/23/2019 1248   Sepsis Labs: @LABRCNTIP (procalcitonin:4,lacticidven:4)  ) Recent Results (from the past 240 hour(s))  Urine culture     Status: Abnormal   Collection Time: 09/23/19 12:48 PM   Specimen: Urine, Random  Result Value Ref Range Status   Specimen Description   Final    URINE, RANDOM Performed at [facility], [address] [place], [facility] [address]    Special Requests   Final    NONE Performed at [facility], [address] [place], [address] [facility]    Culture (A)  Final    <10,000 COLONIES/mL INSIGNIFICANT GROWTH Performed at [facility], [address], [place], [facility] [address]    Report Status 09/24/2019 FINAL  Final  SARS CORONAVIRUS 2 (TAT 6-24 HRS) Nasopharyngeal Nasopharyngeal Swab     Status: None   Collection Time: 09/23/19  5:09 PM   Specimen: Nasopharyngeal Swab  Result Value Ref Range Status   SARS Coronavirus 2 NEGATIVE NEGATIVE Final    Comment: (NOTE) SARS-CoV-2 target nucleic acids are NOT DETECTED. The SARS-CoV-2 RNA is generally detectable in upper and lower respiratory specimens during the acute phase of infection. Negative results do not preclude SARS-CoV-2 infection, do not rule out co-infections with other pathogens, and should not be used as the sole basis for treatment or other patient management decisions. Negative results must be combined with clinical observations, patient history, and epidemiological information. The expected result is Negative. Fact Sheet for Patients: HairSlick.no Fact Sheet for Healthcare Providers: quierodirigir.com This test is not yet approved or cleared by the [place] FDA and  has been authorized for detection and/or diagnosis of SARS-CoV-2 by FDA under an Emergency  Use Authorization (EUA). This EUA will remain  in effect (meaning this test can be used) for the duration of the COVID-19 declaration under Section 56 4(b)(1) of the Act, 21 U.S.C. section 360bbb-3(b)(1), unless the authorization is terminated or revoked sooner. Performed at [facility], [address] [place], [place] [address]       Studies: No results found.  Scheduled Meds: . aspirin  81 mg Oral Daily  . atorvastatin  40 mg Oral Daily  . capsaicin   Topical Once  . folic acid  1 mg Oral Daily  . LORazepam  0-4 mg Intravenous Q6H   Followed by  . [START ON 09/26/2019] LORazepam  0-4 mg Intravenous Q12H  . metoprolol succinate  25 mg Oral Daily  . metoprolol tartrate  2.5 mg Intravenous Q6H  . multivitamin with minerals  1 tablet Oral Daily  . norgestimate-ethinyl estradiol  1 tablet Oral Daily  . potassium chloride SA  20 mEq Oral Daily  . sacubitril-valsartan  1 tablet Oral BID  . spironolactone  25 mg Oral Daily  . thiamine  100 mg Oral Daily   Or  . thiamine  100 mg Intravenous Daily    Continuous Infusions: . sodium chloride    . heparin 1,100 Units/hr (09/25/19 0456)     LOS: 2 days     Hollice Espy, MD [facility]   09/25/2019, 12:32 PM

## 2019-09-25 NOTE — Progress Notes (Signed)
  Echocardiogram 2D Echocardiogram has been performed.  Tina Colon 09/25/2019, 1:26 PM

## 2019-09-26 ENCOUNTER — Encounter (HOSPITAL_COMMUNITY)
Admission: EM | Disposition: A | Discharge status: Home / Self Care | Payer: Self-pay | Source: Home / Self Care | Attending: Internal Medicine

## 2019-09-26 HISTORY — PX: RIGHT/LEFT HEART CATH AND CORONARY ANGIOGRAPHY: CATH118266

## 2019-09-26 LAB — POCT I-STAT EG7
Acid-Base Excess: 1 mmol/L (ref 0.0–2.0)
Bicarbonate: 25.9 mmol/L (ref 20.0–28.0)
Calcium, Ion: 1.22 mmol/L (ref 1.15–1.40)
HCT: 36 % (ref 36.0–46.0)
Hemoglobin: 12.2 g/dL (ref 12.0–15.0)
O2 Saturation: 70 %
Potassium: 3.9 mmol/L (ref 3.5–5.1)
Sodium: 141 mmol/L (ref 135–145)
TCO2: 27 mmol/L (ref 22–32)
pCO2, Ven: 39.2 mmHg — ABNORMAL LOW (ref 44.0–60.0)
pH, Ven: 7.428 (ref 7.250–7.430)
pO2, Ven: 36 mmHg (ref 32.0–45.0)

## 2019-09-26 LAB — POCT I-STAT 7, (LYTES, BLD GAS, ICA,H+H)
Acid-Base Excess: 1 mmol/L (ref 0.0–2.0)
Bicarbonate: 25.4 mmol/L (ref 20.0–28.0)
Calcium, Ion: 1.19 mmol/L (ref 1.15–1.40)
HCT: 36 % (ref 36.0–46.0)
Hemoglobin: 12.2 g/dL (ref 12.0–15.0)
O2 Saturation: 99 %
Potassium: 3.8 mmol/L (ref 3.5–5.1)
Sodium: 142 mmol/L (ref 135–145)
TCO2: 26 mmol/L (ref 22–32)
pCO2 arterial: 36.7 mmHg (ref 32.0–48.0)
pH, Arterial: 7.448 (ref 7.350–7.450)
pO2, Arterial: 126 mmHg — ABNORMAL HIGH (ref 83.0–108.0)

## 2019-09-26 LAB — COMPREHENSIVE METABOLIC PANEL
ALT: 23 U/L (ref 0–44)
AST: 25 U/L (ref 15–41)
Albumin: 3.2 g/dL — ABNORMAL LOW (ref 3.5–5.0)
Alkaline Phosphatase: 39 U/L (ref 38–126)
Anion gap: 15 (ref 5–15)
BUN: 8 mg/dL (ref 6–20)
CO2: 22 mmol/L (ref 22–32)
Calcium: 8.9 mg/dL (ref 8.9–10.3)
Chloride: 104 mmol/L (ref 98–111)
Creatinine, Ser: 0.81 mg/dL (ref 0.44–1.00)
GFR calc Af Amer: >60 mL/min (ref >60)
GFR calc non Af Amer: >60 mL/min (ref >60)
Glucose, Bld: 90 mg/dL (ref 70–99)
Potassium: 3.3 mmol/L — ABNORMAL LOW (ref 3.5–5.1)
Sodium: 141 mmol/L (ref 135–145)
Total Bilirubin: 0.3 mg/dL (ref 0.3–1.2)
Total Protein: 6.1 g/dL — ABNORMAL LOW (ref 6.5–8.1)

## 2019-09-26 LAB — CBC
HCT: 40.8 % (ref 36.0–46.0)
Hemoglobin: 13.6 g/dL (ref 12.0–15.0)
MCH: 30.6 pg (ref 26.0–34.0)
MCHC: 33.3 g/dL (ref 30.0–36.0)
MCV: 91.7 fL (ref 80.0–100.0)
Platelets: 269 10*3/uL (ref 150–400)
RBC: 4.45 MIL/uL (ref 3.87–5.11)
RDW: 12.7 % (ref 11.5–15.5)
WBC: 8.2 10*3/uL (ref 4.0–10.5)
nRBC: 0 % (ref 0.0–0.2)

## 2019-09-26 LAB — HEPARIN LEVEL (UNFRACTIONATED): Heparin Unfractionated: 0.41 IU/mL (ref 0.30–0.70)

## 2019-09-26 SURGERY — RIGHT/LEFT HEART CATH AND CORONARY ANGIOGRAPHY
Anesthesia: LOCAL

## 2019-09-26 MED ORDER — HEPARIN SODIUM (PORCINE) 1000 UNIT/ML IJ SOLN
INTRAMUSCULAR | Status: DC | PRN
  Administered 2019-09-26: 4000 [IU] via INTRAVENOUS

## 2019-09-26 MED ORDER — LIDOCAINE HCL (PF) 1 % IJ SOLN
INTRAMUSCULAR | Status: DC | PRN
  Administered 2019-09-26: 1 mL
  Administered 2019-09-26: 2 mL

## 2019-09-26 MED ORDER — SODIUM CHLORIDE 0.9 % IV SOLN: 250.0000 mL | INTRAVENOUS | Status: DC | PRN

## 2019-09-26 MED ORDER — HEPARIN (PORCINE) IN NACL 1000-0.9 UT/500ML-% IV SOLN
INTRAVENOUS | Status: AC
  Filled 2019-09-26: qty 1000

## 2019-09-26 MED ORDER — SODIUM CHLORIDE 0.9% FLUSH: 3.0000 mL | INTRAVENOUS | Status: DC | PRN

## 2019-09-26 MED ORDER — IOHEXOL 350 MG/ML SOLN
INTRAVENOUS | Status: DC | PRN
  Administered 2019-09-26: 30 mL

## 2019-09-26 MED ORDER — HEPARIN (PORCINE) IN NACL 1000-0.9 UT/500ML-% IV SOLN
INTRAVENOUS | Status: DC | PRN
  Administered 2019-09-26: 500 mL

## 2019-09-26 MED ORDER — LABETALOL HCL 5 MG/ML IV SOLN: 10.0000 mg | INTRAVENOUS | Status: AC | PRN

## 2019-09-26 MED ORDER — FENTANYL CITRATE (PF) 100 MCG/2ML IJ SOLN
INTRAMUSCULAR | Status: AC
  Filled 2019-09-26: qty 2

## 2019-09-26 MED ORDER — VERAPAMIL HCL 2.5 MG/ML IV SOLN
INTRAVENOUS | Status: AC
  Filled 2019-09-26: qty 2

## 2019-09-26 MED ORDER — VERAPAMIL HCL 2.5 MG/ML IV SOLN
INTRAVENOUS | Status: DC | PRN
  Administered 2019-09-26: 5 mL via INTRA_ARTERIAL

## 2019-09-26 MED ORDER — FUROSEMIDE 40 MG PO TABS
40.0000 mg | ORAL_TABLET | Freq: Every day | ORAL | Status: DC
  Administered 2019-09-26 – 2019-09-27 (×2): 40 mg via ORAL
  Filled 2019-09-26 (×2): qty 1

## 2019-09-26 MED ORDER — FENTANYL CITRATE (PF) 100 MCG/2ML IJ SOLN
INTRAMUSCULAR | Status: DC | PRN
  Administered 2019-09-26: 25 ug via INTRAVENOUS

## 2019-09-26 MED ORDER — ONDANSETRON HCL 4 MG/2ML IJ SOLN: 4.0000 mg | Freq: Four times a day (QID) | INTRAMUSCULAR | Status: DC | PRN

## 2019-09-26 MED ORDER — MIDAZOLAM HCL 2 MG/2ML IJ SOLN
INTRAMUSCULAR | Status: DC | PRN
  Administered 2019-09-26: 1 mg via INTRAVENOUS

## 2019-09-26 MED ORDER — HEPARIN SODIUM (PORCINE) 1000 UNIT/ML IJ SOLN
INTRAMUSCULAR | Status: AC
  Filled 2019-09-26: qty 1

## 2019-09-26 MED ORDER — FAMOTIDINE 20 MG PO TABS
20.0000 mg | ORAL_TABLET | Freq: Every day | ORAL | Status: DC
  Administered 2019-09-26 – 2019-09-27 (×2): 20 mg via ORAL
  Filled 2019-09-26 (×2): qty 1

## 2019-09-26 MED ORDER — MIDAZOLAM HCL 2 MG/2ML IJ SOLN
INTRAMUSCULAR | Status: AC
  Filled 2019-09-26: qty 2

## 2019-09-26 MED ORDER — HYDRALAZINE HCL 20 MG/ML IJ SOLN: 10.0000 mg | INTRAMUSCULAR | Status: AC | PRN

## 2019-09-26 MED ORDER — ACETAMINOPHEN 325 MG PO TABS: 650.0000 mg | ORAL_TABLET | ORAL | Status: DC | PRN

## 2019-09-26 MED ORDER — ENOXAPARIN SODIUM 40 MG/0.4ML ~~LOC~~ SOLN
40.0000 mg | SUBCUTANEOUS | Status: DC
  Administered 2019-09-26: 40 mg via SUBCUTANEOUS
  Filled 2019-09-26: qty 0.4

## 2019-09-26 MED ORDER — SODIUM CHLORIDE 0.9% FLUSH
3.0000 mL | Freq: Two times a day (BID) | INTRAVENOUS | Status: DC
  Administered 2019-09-26 – 2019-09-27 (×2): 3 mL via INTRAVENOUS

## 2019-09-26 MED ORDER — POTASSIUM CHLORIDE CRYS ER 20 MEQ PO TBCR
40.0000 meq | EXTENDED_RELEASE_TABLET | Freq: Once | ORAL | Status: AC
  Administered 2019-09-26: 40 meq via ORAL
  Filled 2019-09-26: qty 2

## 2019-09-26 MED ORDER — POTASSIUM PHOSPHATES 15 MMOLE/5ML IV SOLN
20.0000 mmol | Freq: Once | INTRAVENOUS | Status: AC
  Administered 2019-09-26: 20 mmol via INTRAVENOUS
  Filled 2019-09-26: qty 6.67

## 2019-09-26 MED ORDER — LIDOCAINE HCL (PF) 1 % IJ SOLN
INTRAMUSCULAR | Status: AC
  Filled 2019-09-26: qty 30

## 2019-09-26 SURGICAL SUPPLY — 14 items
CATH BALLN WEDGE 5F 110CM (CATHETERS) ×1 IMPLANT
CATH INFINITI 5FR ANG PIGTAIL (CATHETERS) ×1 IMPLANT
CATH OPTITORQUE TIG 4.0 5F (CATHETERS) ×1 IMPLANT
DEVICE RAD COMP TR BAND LRG (VASCULAR PRODUCTS) ×1 IMPLANT
GLIDESHEATH SLEND A-KIT 6F 22G (SHEATH) ×1 IMPLANT
GUIDEWIRE INQWIRE 1.5J.035X260 (WIRE) IMPLANT
INQWIRE 1.5J .035X260CM (WIRE) ×2
KIT HEART LEFT (KITS) ×2 IMPLANT
PACK CARDIAC CATHETERIZATION (CUSTOM PROCEDURE TRAY) ×2 IMPLANT
SHEATH GLIDE SLENDER 4/5FR (SHEATH) ×1 IMPLANT
SYR MEDRAD MARK 7 150ML (SYRINGE) ×1 IMPLANT
TRANSDUCER W/STOPCOCK (MISCELLANEOUS) ×2 IMPLANT
TUBING CIL FLEX 10 FLL-RA (TUBING) ×2 IMPLANT
WIRE EMERALD 3MM-J .025X260CM (WIRE) ×1 IMPLANT

## 2019-09-26 NOTE — Progress Notes (Signed)
ANTICOAGULATION CONSULT NOTE  Pharmacy Consult for Heparin Indication: chest pain/ACS  Allergies  Allergen Reactions  . Penicillins Anaphylaxis    Has patient had a PCN reaction causing immediate rash, facial/tongue/throat swelling, SOB or lightheadedness with hypotension:Yes Has patient had a PCN reaction causing severe rash involving mucus membranes or skin necrosis:unknown Has patient had a PCN reaction that required hospitalization:Yes Has patient had a PCN reaction occurring within the last 10 years:No If all of the above answers are "NO", then may proceed with Cephalosporin use.     Patient Measurements: Height: 5' (152.4 cm) Weight: 77.2 kg (170 lb 3.2 oz) IBW/kg (Calculated) : 45.5 Heparin Dosing Weight: 63.2 kg  Vital Signs: Temp: 98.5 F (36.9 C) (04/19 0609) Temp Source: Oral (04/19 0609) BP: 91/72 (04/19 0609) Pulse Rate: 99 (04/19 0609)  Labs: Recent Labs    09/23/19 1141 09/23/19 1550 09/24/19 0046 09/24/19 0342 09/24/19 0531 09/24/19 0919 09/24/19 1522 09/25/19 0331 09/25/19 1106 09/25/19 1254 09/26/19 0338  HGB   < >  --   --   --  13.9  --   --  14.9  --   --  13.6  HCT   < >  --   --   --  40.2  --   --  43.0  --   --  40.8  PLT   < >  --   --   --  302  --   --  279  --   --  269  APTT  --   --  83*  --   --   --   --   --   --   --   --   LABPROT  --   --  13.1  --   --   --   --   --   --   --   --   INR  --   --  1.0  --   --   --   --   --   --   --   --   HEPARINUNFRC   < >  --  0.48  --  0.23*  --    < > 0.29* 0.30  --  0.41  CREATININE   < >  --   --   --   --  0.78  --  0.75  --   --  0.81  TROPONINIHS  --  1,067*  --  2,125*  --   --   --   --   --  644*  --    < > = values in this interval not displayed.    Estimated Creatinine Clearance: 84 mL/min (by C-G formula based on SCr of 0.81 mg/dL).   Assessment: 39 yo F presents with NSTEMI. Pharmacy dosing heparin which is therapeutic, CBC stable. Cardiac cath planned for  today.  Goal of Therapy:  Heparin level 0.3-0.7 units/ml Monitor platelets by anticoagulation protocol: Yes   Plan:  -Continue IV heparin 1150 units/h -F/U after cath   Fredonia Highland, PharmD, BCPS Clinical Pharmacist [phone] Please check AMION for all [facility] numbers 09/26/2019

## 2019-09-26 NOTE — Interval H&P Note (Signed)
History and Physical Interval Note:  09/26/2019 7:32 AM  Lisabeth Pick Baik  has presented today for surgery, with the diagnosis of Nonstemi.  The various methods of treatment have been discussed with the patient and family. After consideration of risks, benefits and other options for treatment, the patient has consented to  Procedure(s): RIGHT/LEFT HEART CATH AND CORONARY ANGIOGRAPHY (N/A) as a surgical intervention.  The patient's history has been reviewed, patient examined, no change in status, stable for surgery.  I have reviewed the patient's chart and labs.  Questions were answered to the patient's satisfaction.     2012 Appropriate Use Criteria for Diagnostic Catheterization Home / Select Test of Interest Indication for RHC Cardiomyopathies Cardiomyopathies (Right and Left Heart Catheterization OR Right Heart Catheterization Alone With/Wit Cardiomyopathies  (Right and Left Heart Catheterization OR  Right Heart Catheterization Alone With/Without Left Ventriculography and Coronary Angiography)  Link Here: PlayerPointers.cz Indication:  Known or suspected cardiomyopathy with or without heart failure A (7) Indication: 93; Score 7   2016 Appropriate Use Criteria for Coronary Revascularization in Patients With Acute Coronary Syndrome NSTEMI/UA High Risk (TIMI Score 5-7) NSTEMI/Unstable angina, stabilized patient at high risk Link Here: https://powell.info/ Indication:  Revascularization by PCI or CABG of 1 or more arteries in a patient with NSTEMI or unstable angina with Stabilization after presentation High risk for clinical events  A (7) Indication: 16; Score 7    Lakeyia Surber J Prajwal Fellner

## 2019-09-26 NOTE — Progress Notes (Signed)
PROGRESS NOTE    Tina Colon  PJA:[id number] DOB: 05-18-1981 DOA: 09/23/2019 PCP: Georgina Quint, MD    Brief Narrative:  39 year old female past medical history of systolic CHF, pancreatitis, alcohol abuse and GERD presented to the emergency room on 4/17 morning with complaints of epigastric abdominal pain and with her last drink being 5 to 6 days prior.  The emergency room, noted to be tachycardic with a heart rate in the 140s oxygen saturations as low as 87% briefly requiring 2 L of nasal cannula oxygen, but weaned off, normal BNP, but troponins continued to trend upward from initially normal up to as high as 2100.  Patient suspected be having a non-STEMI.  CT angiogram negative for put pulmonary embolus.  Patient started on heparin drip and cardiology consulted.  Chest pain-free and transported to stepdown.   Assessment & Plan:   Principal Problem:   NSTEMI (non-ST elevated myocardial infarction) (HCC) Active Problems:   Acute on chronic systolic CHF (congestive heart failure) (HCC)   Epigastric pain   Hx of cardiomyopathy   Family history of premature coronary artery disease   Prolonged Q-T interval on ECG   Class 1 obesity due to excess calories with serious comorbidity and body mass index (BMI) of 33.0 to 33.9 in adult   Former smoker   Marijuana use   Alcohol use   Nausea and vomiting   History of pancreatitis   Obesity (BMI 30-39.9)  Non-ST elevation MI: Suspected.  Ruled out.  Troponin elevation due to demand ischemia.  Initially treated with aspirin, statin and heparin infusion.  Chest pain resolved. Cardiac cath 09/26/2019 with clean coronaries.  Heparin discontinued. Cardiology suggested treat for GERD and heart failure.  Will start patient on Pepcid.  Currently chest pain-free.  Nonischemic cardiomyopathy: Ejection fraction 15%.  Mostly compensated.  Patient was started on Lasix 40 mg, Toprol-XL, Entresto and Aldactone.  Potassium supplementation.  Recheck  electrolytes tomorrow morning.  Ambulate and advance activities.  Will need outpatient follow-up with cardiology.  Alcohol use with risk of alcohol withdrawal: Recently quit.  On multivitamins.  Last drink more than 7 days ago.  No evidence of withdrawals.  Extensively counseled.  Hypokalemia/hypophosphatemia: Replace aggressively and monitor levels.  Recheck in the morning.  DVT prophylaxis: Lovenox subcu Code Status: Full code Family Communication: None Disposition Plan: Status is: Inpatient  Remains inpatient appropriate because:Persistent severe electrolyte disturbances and Ongoing diagnostic testing needed not appropriate for outpatient work up   Dispo: The patient is from: Home              Anticipated d/c is to: Home              Anticipated d/c date is: 1 day              Patient currently is medically stable to d/c.          Consultants:   Cardiology  Procedures:   Cardiac cath  Antimicrobials:   None   Subjective: Patient seen and examined.  She came back from cardiac cath.  Denies any complaints.  She was able to ambulate around.  Results discussed.  Objective: Vitals:   09/26/19 0803 09/26/19 0808 09/26/19 0813 09/26/19 0846  BP: 114/76 119/75 115/74 112/77  Pulse: (!) 101 96 100 95  Resp: 14 (!) 31 15   Temp:    98.5 F (36.9 C)  TempSrc:    Oral  SpO2: 100% 100% 100% 100%  Weight:  Height:        Intake/Output Summary (Last 24 hours) at 09/26/2019 1321 Last data filed at 09/26/2019 0700 Gross per 24 hour  Intake 875.48 ml  Output 2500 ml  Net -1624.52 ml   Filed Weights   09/24/19 0758 09/25/19 0501 09/26/19 0609  Weight: 77.3 kg 77.4 kg 77.2 kg    Examination:  General exam: Appears calm and comfortable  Respiratory system: Clear to auscultation. Respiratory effort normal. Cardiovascular system: S1 & S2 heard, RRR. No JVD, murmurs, rubs, gallops or clicks. No pedal edema. Gastrointestinal system: Abdomen is nondistended, soft  and nontender. Central nervous system: Alert and oriented. No focal neurological deficits. Extremities: Symmetric 5 x 5 power. Skin: No rashes, lesions or ulcers Psychiatry: Judgement and insight appear normal. Mood & affect appropriate.     Data Reviewed: I have personally reviewed following labs and imaging studies  CBC: Recent Labs  Lab 09/21/19 2041 09/23/19 1141 09/24/19 0531 09/25/19 0331 09/26/19 0338  WBC 11.2* 12.7* 18.0* 15.4* 8.2  HGB 14.1 14.4 13.9 14.9 13.6  HCT 41.2 42.0 40.2 43.0 40.8  MCV 90.7 89.0 90.1 89.8 91.7  PLT 247 308 302 279 269   Basic Metabolic Panel: Recent Labs  Lab 09/21/19 2041 09/23/19 1141 09/24/19 0919 09/25/19 0331 09/26/19 0338  NA 140 138 138 138 141  K 3.1* 2.6* 2.7* 3.5 3.3*  CL 102 100 100 105 104  CO2 23 22 23 23 22   GLUCOSE 180* 142* 135* 119* 90  BUN 9 15 7 6 8   CREATININE 0.70 0.83 0.78 0.75 0.81  CALCIUM 9.7 9.9 9.0 8.9 8.9  MG 1.6* 2.0  --  2.3  --   PHOS  --   --   --  1.5*  --    GFR: Estimated Creatinine Clearance: 84 mL/min (by C-G formula based on SCr of 0.81 mg/dL). Liver Function Tests: Recent Labs  Lab 09/21/19 2041 09/23/19 1141 09/26/19 0338  AST 29 26 25   ALT 21 22 23   ALKPHOS 42 43 39  BILITOT 0.3 0.4 0.3  PROT 7.9 8.0 6.1*  ALBUMIN 4.5 4.4 3.2*   Recent Labs  Lab 09/21/19 2041 09/23/19 1141  LIPASE 23 32   No results for input(s): AMMONIA in the last 168 hours. Coagulation Profile: Recent Labs  Lab 09/24/19 0046  INR 1.0   Cardiac Enzymes: No results for input(s): CKTOTAL, CKMB, CKMBINDEX, TROPONINI in the last 168 hours. BNP (last 3 results) No results for input(s): PROBNP in the last 8760 hours. HbA1C: Recent Labs    09/24/19 0919  HGBA1C 5.5   CBG: No results for input(s): GLUCAP in the last 168 hours. Lipid Profile: Recent Labs    09/24/19 0919  CHOL 147  HDL 61  LDLCALC 60  TRIG 130  CHOLHDL 2.4   Thyroid Function Tests: Recent Labs    09/25/19 1254  TSH  1.483   Anemia Panel: No results for input(s): VITAMINB12, FOLATE, FERRITIN, TIBC, IRON, RETICCTPCT in the last 72 hours. Sepsis Labs: No results for input(s): PROCALCITON, LATICACIDVEN in the last 168 hours.  Recent Results (from the past 240 hour(s))  Urine culture     Status: Abnormal   Collection Time: 09/23/19 12:48 PM   Specimen: Urine, Random  Result Value Ref Range Status   Specimen Description   Final    URINE, RANDOM Performed at [facility], [address], [place], 09/25/19 [facility]    Special Requests   Final    NONE  Performed at [facility], [address] [place], [place] [address]    Culture (A)  Final    <10,000 COLONIES/mL INSIGNIFICANT GROWTH Performed at [facility], [address] [place], [place] [address]    Report Status 09/24/2019 FINAL  Final  SARS CORONAVIRUS 2 (TAT 6-24 HRS) Nasopharyngeal Nasopharyngeal Swab     Status: None   Collection Time: 09/23/19  5:09 PM   Specimen: Nasopharyngeal Swab  Result Value Ref Range Status   SARS Coronavirus 2 NEGATIVE NEGATIVE Final    Comment: (NOTE) SARS-CoV-2 target nucleic acids are NOT DETECTED. The SARS-CoV-2 RNA is generally detectable in upper and lower respiratory specimens during the acute phase of infection. Negative results do not preclude SARS-CoV-2 infection, do not rule out co-infections with other pathogens, and should not be used as the sole basis for treatment or other patient management decisions. Negative results must be combined with clinical observations, patient history, and epidemiological information. The expected result is Negative. Fact Sheet for Patients: HairSlick.no Fact Sheet for Healthcare Providers: quierodirigir.com This test is not yet approved or cleared by the [place] FDA and  has been authorized for detection and/or diagnosis of SARS-CoV-2 by FDA under an Emergency Use  Authorization (EUA). This EUA will remain  in effect (meaning this test can be used) for the duration of the COVID-19 declaration under Section 56 4(b)(1) of the Act, 21 U.S.C. section 360bbb-3(b)(1), unless the authorization is terminated or revoked sooner. Performed at [facility], [address] [place], [place] [address]          Radiology Studies: CARDIAC CATHETERIZATION  Result Date: 09/26/2019 Normal coronaries Severe global hypokinesis, LVEF <15% Compensated nonischemic cardiomyopathy  ECHOCARDIOGRAM COMPLETE  Result Date: 09/25/2019    ECHOCARDIOGRAM REPORT   Patient Name:   STELA DUNSFORD Date of Exam: 09/25/2019 Medical Rec #:  [id number]       Height:       60.0 in Accession #:    [id number]      Weight:       170.7 lb Date of Birth:  1980/09/02       BSA:          1.745 m Patient Age:    39 years        BP:           105/84 mmHg Patient Gender: F               HR:           106 bpm. Exam Location:  Inpatient Procedure: 2D Echo, Cardiac Doppler and Color Doppler Indications:    NSTEMI I21.4  History:        Patient has prior history of Echocardiogram examinations, most                 recent 12/24/2017. CHF and Cardiomyopathy; Risk Factors:Former                 Smoker. Alcohol abuse. Marijuana abuse. Family history of                 premature coronary artery disease.  Sonographer:    Ross Ludwig RDCS (AE) Referring Phys: [id number] RONDELL A SMITH IMPRESSIONS  1. Left ventricular ejection fraction, by estimation, is 15-20%. The left ventricle has severely decreased function. The left ventricle demonstrates regional wall motion abnormalities (see scoring diagram/findings for description). Indeterminate diastolic filling due to E-A fusion.  2. Right ventricular systolic  function is normal. The right ventricular size is normal. Tricuspid regurgitation signal is inadequate for assessing PA pressure.  3. The mitral valve is grossly normal. No evidence of mitral valve regurgitation. No  evidence of mitral stenosis.  4. The aortic valve is tricuspid. Aortic valve regurgitation is not visualized. No aortic stenosis is present.  5. The inferior vena cava is normal in size with <50% respiratory variability, suggesting right atrial pressure of 8 mmHg. Comparison(s): A prior study was performed on 12/24/2017. Prior images unable to be directly viewed, comparison made by report only. No significant change from prior study. FINDINGS  Left Ventricle: Left ventricular ejection fraction, by estimation, is 15-20%. The left ventricle has severely decreased function. The left ventricle demonstrates regional wall motion abnormalities. The left ventricular internal cavity size was normal in  size. There is no left ventricular hypertrophy. Indeterminate diastolic filling due to E-A fusion.  LV Wall Scoring: The mid and distal anterior septum, mid inferoseptal segment, apical anterior segment, apical inferior segment, and apex are akinetic. Right Ventricle: The right ventricular size is normal. No increase in right ventricular wall thickness. Right ventricular systolic function is normal. Tricuspid regurgitation signal is inadequate for assessing PA pressure. Left Atrium: Left atrial size was normal in size. Right Atrium: Right atrial size was normal in size. Pericardium: There is no evidence of pericardial effusion. Mitral Valve: The mitral valve is grossly normal. No evidence of mitral valve regurgitation. No evidence of mitral valve stenosis. Tricuspid Valve: The tricuspid valve is grossly normal. Tricuspid valve regurgitation is trivial. No evidence of tricuspid stenosis. Aortic Valve: The aortic valve is tricuspid. Aortic valve regurgitation is not visualized. No aortic stenosis is present. Aortic valve mean gradient measures 2.0 mmHg. Aortic valve peak gradient measures 2.7 mmHg. Aortic valve area, by VTI measures 3.14 cm. Pulmonic Valve: The pulmonic valve was grossly normal. Pulmonic valve regurgitation is  not visualized. No evidence of pulmonic stenosis. Aorta: The aortic root and ascending aorta are structurally normal, with no evidence of dilitation. Venous: The inferior vena cava is normal in size with less than 50% respiratory variability, suggesting right atrial pressure of 8 mmHg. IAS/Shunts: The atrial septum is grossly normal.  LEFT VENTRICLE PLAX 2D LVIDd:         4.70 cm  Diastology LVIDs:         4.30 cm  LV e' lateral: 5.11 cm/s LV PW:         0.99 cm  LV e' medial:  2.83 cm/s LV IVS:        0.87 cm LVOT diam:     2.20 cm LV SV:         36 LV SV Index:   21 LVOT Area:     3.80 cm  RIGHT VENTRICLE            IVC RV Basal diam:  2.60 cm    IVC diam: 1.60 cm RV S prime:     9.03 cm/s TAPSE (M-mode): 2.2 cm LEFT ATRIUM             Index       RIGHT ATRIUM           Index LA diam:        3.00 cm 1.72 cm/m  RA Area:     11.50 cm LA Vol (A2C):   35.6 ml 20.40 ml/m RA Volume:   22.00 ml  12.61 ml/m LA Vol (A4C):   44.2 ml 25.33 ml/m LA Biplane Vol:  39.9 ml 22.86 ml/m  AORTIC VALVE AV Area (Vmax):    3.02 cm AV Area (Vmean):   2.49 cm AV Area (VTI):     3.14 cm AV Vmax:           82.40 cm/s AV Vmean:          65.000 cm/s AV VTI:            0.114 m AV Peak Grad:      2.7 mmHg AV Mean Grad:      2.0 mmHg LVOT Vmax:         65.50 cm/s LVOT Vmean:        42.500 cm/s LVOT VTI:          0.094 m LVOT/AV VTI ratio: 0.83  AORTA Ao Root diam: 2.80 cm Ao Asc diam:  3.00 cm  SHUNTS Systemic VTI:  0.09 m Systemic Diam: 2.20 cm Eleonore Chiquito MD Electronically signed by Eleonore Chiquito MD Signature Date/Time: 09/25/2019/2:19:07 PM    Final         Scheduled Meds: . aspirin  81 mg Oral Daily  . atorvastatin  40 mg Oral Daily  . capsaicin   Topical Once  . enoxaparin (LOVENOX) injection  40 mg Subcutaneous Q24H  . famotidine  20 mg Oral Daily  . folic acid  1 mg Oral Daily  . furosemide  40 mg Oral Daily  . LORazepam  0-4 mg Intravenous Q6H   Followed by  . LORazepam  0-4 mg Intravenous Q12H  . metoprolol  succinate  25 mg Oral Daily  . multivitamin with minerals  1 tablet Oral Daily  . potassium chloride SA  20 mEq Oral Daily  . sacubitril-valsartan  1 tablet Oral BID  . sodium chloride flush  3 mL Intravenous Q12H  . spironolactone  25 mg Oral Daily  . thiamine  100 mg Oral Daily   Or  . thiamine  100 mg Intravenous Daily   Continuous Infusions: . sodium chloride    . sodium chloride    . potassium PHOSPHATE IVPB (in mmol)       LOS: 3 days    Time spent: 25 minutes    Barb Merino, MD [facility] Pager [phone]

## 2019-09-26 NOTE — Progress Notes (Signed)
Provided substance abuse councelling/ outpatient treatment services that are available in the area.

## 2019-09-27 LAB — BASIC METABOLIC PANEL
Anion gap: 11 (ref 5–15)
BUN: 9 mg/dL (ref 6–20)
CO2: 23 mmol/L (ref 22–32)
Calcium: 9.1 mg/dL (ref 8.9–10.3)
Chloride: 105 mmol/L (ref 98–111)
Creatinine, Ser: 0.88 mg/dL (ref 0.44–1.00)
GFR calc Af Amer: >60 mL/min (ref >60)
GFR calc non Af Amer: >60 mL/min (ref >60)
Glucose, Bld: 104 mg/dL — ABNORMAL HIGH (ref 70–99)
Potassium: 3.8 mmol/L (ref 3.5–5.1)
Sodium: 139 mmol/L (ref 135–145)

## 2019-09-27 LAB — PHOSPHORUS: Phosphorus: 5.1 mg/dL — ABNORMAL HIGH (ref 2.5–4.6)

## 2019-09-27 LAB — MAGNESIUM: Magnesium: 1.9 mg/dL (ref 1.7–2.4)

## 2019-09-27 MED ORDER — ATORVASTATIN CALCIUM 40 MG PO TABS: 40.0000 mg | ORAL_TABLET | Freq: Every day | ORAL | 0 refills | Status: DC

## 2019-09-27 MED ORDER — METOPROLOL SUCCINATE ER 50 MG PO TB24: 50.0000 mg | ORAL_TABLET | Freq: Every day | ORAL | 0 refills | Status: DC

## 2019-09-27 MED ORDER — METOPROLOL SUCCINATE ER 50 MG PO TB24
50.0000 mg | ORAL_TABLET | Freq: Every day | ORAL | Status: DC
  Administered 2019-09-27: 50 mg via ORAL
  Filled 2019-09-27: qty 1

## 2019-09-27 MED ORDER — IVABRADINE HCL 5 MG PO TABS: 2.5000 mg | ORAL_TABLET | Freq: Two times a day (BID) | ORAL | 0 refills | Status: AC

## 2019-09-27 MED ORDER — IVABRADINE HCL 5 MG PO TABS
2.5000 mg | ORAL_TABLET | Freq: Two times a day (BID) | ORAL | Status: DC
  Administered 2019-09-27: 2.5 mg via ORAL
  Filled 2019-09-27 (×2): qty 1

## 2019-09-27 NOTE — Progress Notes (Signed)
Pt refused lipitor

## 2019-09-27 NOTE — TOC Benefit Eligibility Note (Signed)
Transition of Care [facility]) Benefit Eligibility Note    Patient Details  Name: Tina Colon MRN: [id number] Date of Birth: [birth year]   Medication/Dose: Sherryll Burger  498-51 MG BID  Covered?: Yes  Tier: 2 Drug  Prescription Coverage Preferred Pharmacy: [facility] with Person/Company/Phone Number:: LAURIE  @ [employer]  #   [phone]  Co-Pay: $829.56  Prior Approval: No  Deductible: Unmet(out-of-pocket: unmet)       Mardene Sayer Phone Number: 09/27/2019, 11:26 AM

## 2019-09-27 NOTE — Discharge Summary (Signed)
Physician Discharge Summary  Tina Colon DGU:[id number] DOB: 02-08-1981 DOA: 09/23/2019  PCP: Georgina Quint, MD  Admit date: 09/23/2019 Discharge date: 09/27/2019  Admitted From: Home Disposition: Home  Recommendations for Outpatient Follow-up:  1. Follow up with PCP in 1-2 weeks 2. Please obtain BMP/CBC in one week 3. Follow-up with cardiology as scheduled.   Discharge Condition: Stable CODE STATUS: Full code Diet recommendation: Low-salt diet.  Fluid restriction less than 1500 mL a day.  Discharge summary: 39 year old female past medical history of systolic CHF, pancreatitis, alcohol abuse and GERD presented to the emergency room on 4/17 morning with complaints of epigastric abdominal pain and with her last drink being 5 to 6 days prior. The emergency room, noted to be tachycardic with a heart rate in the 140s oxygen saturations as low as 87% briefly requiring 2 L of nasal cannula oxygen, but weaned off, normal BNP, but troponins continued to trend upward from initially normal up to as high as 2100. Patient suspected to be having a non-STEMI. CT angiogram negative for put pulmonary embolus. Patient started on heparin drip and cardiology consulted. Chest pain-free and transported to stepdown.  Non-ST elevation MI: Suspected.  Ruled out.  Troponin elevation due to demand ischemia.  Initially treated with aspirin, statin and heparin infusion.  Chest pain resolved. Cardiac cath 09/26/2019 with clean coronaries.  Heparin discontinued. Cardiology suggested treat for GERD and heart failure.    Resume Protonix that she had stopped taking.  Currently chest pain-free.  Aspirin not indicated.  Nonischemic cardiomyopathy: Ejection fraction 15%.  Mostly compensated.  Patient was started on Lasix 40 mg, Toprol-XL, Entresto and Aldactone.  Potassium supplementation.   Compensated.  Increased dose of Toprol-XL 50 mg daily.  Cardiology started patient on Corlanor 5 mg twice daily.  Repeat  EKG today with QTC less than 500. Absolute alcohol cessation. She has a scheduled follow-up with cardiology next week.  Patient medically stable.  Discharge home with medications as suggested by cardiology.  She does have cardiology follow-up 4/29.   Discharge Diagnoses:  Principal Problem:   NSTEMI (non-ST elevated myocardial infarction) (HCC) Active Problems:   Acute on chronic systolic CHF (congestive heart failure) (HCC)   Epigastric pain   Hx of cardiomyopathy   Family history of premature coronary artery disease   Prolonged Q-T interval on ECG   Class 1 obesity due to excess calories with serious comorbidity and body mass index (BMI) of 33.0 to 33.9 in adult   Former smoker   Marijuana use   Alcohol use   Nausea and vomiting   History of pancreatitis   Obesity (BMI 30-39.9)    Discharge Instructions  Discharge Instructions    Call MD for:  difficulty breathing, headache or visual disturbances   Complete by: As directed    Call MD for:  extreme fatigue   Complete by: As directed    Diet - low sodium heart healthy   Complete by: As directed    Increase activity slowly   Complete by: As directed      Allergies as of 09/27/2019      Reactions   Penicillins Anaphylaxis   Has patient had a PCN reaction causing immediate rash, facial/tongue/throat swelling, SOB or lightheadedness with hypotension:Yes Has patient had a PCN reaction causing severe rash involving mucus membranes or skin necrosis:unknown Has patient had a PCN reaction that required hospitalization:Yes Has patient had a PCN reaction occurring within the last 10 years:No If all of the above answers are "NO",  then may proceed with Cephalosporin use.      Medication List    STOP taking these medications   folic acid 1 MG tablet Commonly known as: FOLVITE     TAKE these medications   atorvastatin 40 MG tablet Commonly known as: LIPITOR Take 1 tablet (40 mg total) by mouth daily. Start taking on:  September 28, 2019   Entresto 49-51 MG Generic drug: sacubitril-valsartan Take 1 tablet by mouth 2 (two) times daily.   furosemide 20 MG tablet Commonly known as: LASIX Take 1 tablet (20 mg total) by mouth as needed for edema.   Hyoscyamine Sulfate SL 0.125 MG Subl Take 0.125 mg by mouth as needed (muscle cramp / pain.).   ivabradine 5 MG Tabs tablet Commonly known as: CORLANOR Take 0.5 tablets (2.5 mg total) by mouth 2 (two) times daily with a meal.   magnesium oxide 400 MG tablet Commonly known as: MAG-OX Take 1 tablet (400 mg total) by mouth 2 (two) times daily.   metoprolol succinate 50 MG 24 hr tablet Commonly known as: TOPROL-XL Take 1 tablet (50 mg total) by mouth daily. What changed:   medication strength  how much to take   norgestimate-ethinyl estradiol 0.25-35 MG-MCG tablet Commonly known as: ORTHO-CYCLEN Take 1 tablet by mouth daily.   ondansetron 4 MG disintegrating tablet Commonly known as: Zofran ODT 4mg  ODT q4 hours prn nausea/vomit What changed:   how much to take  how to take this  when to take this  reasons to take this  additional instructions   pantoprazole 40 MG tablet Commonly known as: PROTONIX Take 1 tablet by mouth once daily   potassium chloride SA 20 MEQ tablet Commonly known as: KLOR-CON Take 1 tablet (20 mEq total) by mouth daily.   spironolactone 25 MG tablet Commonly known as: ALDACTONE Take 1 tablet (25 mg total) by mouth daily.      Follow-up Information    , DO Follow up on 10/06/2019.   Specialties: Cardiology, Radiology, Vascular Surgery Why: 10:30 AM Contact information: [address] [place] [phone]          Allergies  Allergen Reactions  . Penicillins Anaphylaxis    Has patient had a PCN reaction causing immediate rash, facial/tongue/throat swelling, SOB or lightheadedness with hypotension:Yes Has patient had a PCN reaction causing severe rash involving mucus  membranes or skin necrosis:unknown Has patient had a PCN reaction that required hospitalization:Yes Has patient had a PCN reaction occurring within the last 10 years:No If all of the above answers are "NO", then may proceed with Cephalosporin use.     Consultations:  Cardiology   Procedures/Studies: CT Angio Chest PE W/Cm &/Or Wo Cm  Result Date: 09/23/2019 CLINICAL DATA:  Shortness of breath. History of congestive heart failure. Abdominal pain for 1 year with nausea and vomiting. EXAM: CT ANGIOGRAPHY CHEST WITH CONTRAST TECHNIQUE: Multidetector CT imaging of the chest was performed using the standard protocol during bolus administration of intravenous contrast. Multiplanar CT image reconstructions and MIPs were obtained to evaluate the vascular anatomy. CONTRAST:  66mL OMNIPAQUE IOHEXOL 350 MG/ML SOLN COMPARISON:  12/23/2017 FINDINGS: Cardiovascular: No filling defect is identified in the pulmonary arterial tree to suggest pulmonary embolus. Mildly prominent left ventricle. Mediastinum/Nodes: Unremarkable Lungs/Pleura: Unremarkable Upper Abdomen: Unremarkable Musculoskeletal: Thoracic spondylosis. Review of the MIP images confirms the above findings. IMPRESSION: 1. No filling defect is identified in the pulmonary arterial tree to suggest pulmonary embolus. 2. Mildly prominent left ventricle. Electronically  Signed   By: Gaylyn Rong M.D.   On: 09/23/2019 19:08   CARDIAC CATHETERIZATION  Result Date: 09/26/2019 Normal coronaries Severe global hypokinesis, LVEF <15% Compensated nonischemic cardiomyopathy  DG Chest Port 1 View  Result Date: 09/23/2019 CLINICAL DATA:  Chest pain. EXAM: PORTABLE CHEST 1 VIEW COMPARISON:  December 23, 2017. FINDINGS: The heart size and mediastinal contours are within normal limits. Both lungs are clear. No pneumothorax or pleural effusion is noted. The visualized skeletal structures are unremarkable. IMPRESSION: No active disease. Electronically Signed   By: Lupita Raider M.D.   On: 09/23/2019 13:55   ECHOCARDIOGRAM COMPLETE  Result Date: 09/25/2019    ECHOCARDIOGRAM REPORT   Patient Name:   Tina Colon Date of Exam: 09/25/2019 Medical Rec #:  [id number]       Height:       60.0 in Accession #:    [id number]      Weight:       170.7 lb Date of Birth:  June 24, 1980       BSA:          1.745 m Patient Age:    39 years        BP:           105/84 mmHg Patient Gender: F               HR:           106 bpm. Exam Location:  Inpatient Procedure: 2D Echo, Cardiac Doppler and Color Doppler Indications:    NSTEMI I21.4  History:        Patient has prior history of Echocardiogram examinations, most                 recent 12/24/2017. CHF and Cardiomyopathy; Risk Factors:Former                 Smoker. Alcohol abuse. Marijuana abuse. Family history of                 premature coronary artery disease.  Sonographer:    Ross Ludwig RDCS (AE) Referring Phys: [id number] RONDELL A SMITH IMPRESSIONS  1. Left ventricular ejection fraction, by estimation, is 15-20%. The left ventricle has severely decreased function. The left ventricle demonstrates regional wall motion abnormalities (see scoring diagram/findings for description). Indeterminate diastolic filling due to E-A fusion.  2. Right ventricular systolic function is normal. The right ventricular size is normal. Tricuspid regurgitation signal is inadequate for assessing PA pressure.  3. The mitral valve is grossly normal. No evidence of mitral valve regurgitation. No evidence of mitral stenosis.  4. The aortic valve is tricuspid. Aortic valve regurgitation is not visualized. No aortic stenosis is present.  5. The inferior vena cava is normal in size with <50% respiratory variability, suggesting right atrial pressure of 8 mmHg. Comparison(s): A prior study was performed on 12/24/2017. Prior images unable to be directly viewed, comparison made by report only. No significant change from prior study. FINDINGS  Left Ventricle: Left ventricular  ejection fraction, by estimation, is 15-20%. The left ventricle has severely decreased function. The left ventricle demonstrates regional wall motion abnormalities. The left ventricular internal cavity size was normal in  size. There is no left ventricular hypertrophy. Indeterminate diastolic filling due to E-A fusion.  LV Wall Scoring: The mid and distal anterior septum, mid inferoseptal segment, apical anterior segment, apical inferior segment, and apex are akinetic. Right Ventricle: The right ventricular size is normal. No increase in right ventricular  wall thickness. Right ventricular systolic function is normal. Tricuspid regurgitation signal is inadequate for assessing PA pressure. Left Atrium: Left atrial size was normal in size. Right Atrium: Right atrial size was normal in size. Pericardium: There is no evidence of pericardial effusion. Mitral Valve: The mitral valve is grossly normal. No evidence of mitral valve regurgitation. No evidence of mitral valve stenosis. Tricuspid Valve: The tricuspid valve is grossly normal. Tricuspid valve regurgitation is trivial. No evidence of tricuspid stenosis. Aortic Valve: The aortic valve is tricuspid. Aortic valve regurgitation is not visualized. No aortic stenosis is present. Aortic valve mean gradient measures 2.0 mmHg. Aortic valve peak gradient measures 2.7 mmHg. Aortic valve area, by VTI measures 3.14 cm. Pulmonic Valve: The pulmonic valve was grossly normal. Pulmonic valve regurgitation is not visualized. No evidence of pulmonic stenosis. Aorta: The aortic root and ascending aorta are structurally normal, with no evidence of dilitation. Venous: The inferior vena cava is normal in size with less than 50% respiratory variability, suggesting right atrial pressure of 8 mmHg. IAS/Shunts: The atrial septum is grossly normal.  LEFT VENTRICLE PLAX 2D LVIDd:         4.70 cm  Diastology LVIDs:         4.30 cm  LV e' lateral: 5.11 cm/s LV PW:         0.99 cm  LV e' medial:   2.83 cm/s LV IVS:        0.87 cm LVOT diam:     2.20 cm LV SV:         36 LV SV Index:   21 LVOT Area:     3.80 cm  RIGHT VENTRICLE            IVC RV Basal diam:  2.60 cm    IVC diam: 1.60 cm RV S prime:     9.03 cm/s TAPSE (M-mode): 2.2 cm LEFT ATRIUM             Index       RIGHT ATRIUM           Index LA diam:        3.00 cm 1.72 cm/m  RA Area:     11.50 cm LA Vol (A2C):   35.6 ml 20.40 ml/m RA Volume:   22.00 ml  12.61 ml/m LA Vol (A4C):   44.2 ml 25.33 ml/m LA Biplane Vol: 39.9 ml 22.86 ml/m  AORTIC VALVE AV Area (Vmax):    3.02 cm AV Area (Vmean):   2.49 cm AV Area (VTI):     3.14 cm AV Vmax:           82.40 cm/s AV Vmean:          65.000 cm/s AV VTI:            0.114 m AV Peak Grad:      2.7 mmHg AV Mean Grad:      2.0 mmHg LVOT Vmax:         65.50 cm/s LVOT Vmean:        42.500 cm/s LVOT VTI:          0.094 m LVOT/AV VTI ratio: 0.83  AORTA Ao Root diam: 2.80 cm Ao Asc diam:  3.00 cm  SHUNTS Systemic VTI:  0.09 m Systemic Diam: 2.20 cm Lennie Odor MD Electronically signed by Lennie Odor MD Signature Date/Time: 09/25/2019/2:19:07 PM    Final      Subjective: Patient seen and examined.  No overnight events.  Denies  any chest pain.  She herself denies any palpitations or shortness of breath.  Her heart rate was more than 100 on ambulating.   Discharge Exam: Vitals:   09/27/19 0503 09/27/19 0914  BP: 99/76 100/70  Pulse: (!) 108 (!) 114  Resp:    Temp: 98.2 F (36.8 C)   SpO2: 100%    Vitals:   09/26/19 2102 09/26/19 2103 09/27/19 0503 09/27/19 0914  BP: 100/70  Pulse: (!) 109 72 (!) 108 (!) 114  Resp:      Temp: 98.1 F (36.7 C) 98.1 F (36.7 C) 98.2 F (36.8 C)   TempSrc: Oral Oral Oral   SpO2: 100% 100% 100%   Weight:   77.3 kg   Height:        General: Pt is alert, awake, not in acute distress Cardiovascular: RRR, S1/S2 +, no rubs, no gallops Respiratory: CTA bilaterally, no wheezing, no rhonchi Abdominal: Soft, NT, ND, bowel sounds  + Extremities: no edema, no cyanosis    The results of significant diagnostics from this hospitalization (including imaging, microbiology, ancillary and laboratory) are listed below for reference.     Microbiology: Recent Results (from the past 240 hour(s))  Urine culture     Status: Abnormal   Collection Time: 09/23/19 12:48 PM   Specimen: Urine, Random  Result Value Ref Range Status   Specimen Description   Final    URINE, RANDOM Performed at [facility], [address] [place], [place] [address]    Special Requests   Final    NONE Performed at [facility], [address] [place], [place] [address]    Culture (A)  Final    <10,000 COLONIES/mL INSIGNIFICANT GROWTH Performed at [facility], [address], [place], [place] [address]    Report Status 09/24/2019 FINAL  Final  SARS CORONAVIRUS 2 (TAT 6-24 HRS) Nasopharyngeal Nasopharyngeal Swab     Status: None   Collection Time: 09/23/19  5:09 PM   Specimen: Nasopharyngeal Swab  Result Value Ref Range Status   SARS Coronavirus 2 NEGATIVE NEGATIVE Final    Comment: (NOTE) SARS-CoV-2 target nucleic acids are NOT DETECTED. The SARS-CoV-2 RNA is generally detectable in upper and lower respiratory specimens during the acute phase of infection. Negative results do not preclude SARS-CoV-2 infection, do not rule out co-infections with other pathogens, and should not be used as the sole basis for treatment or other patient management decisions. Negative results must be combined with clinical observations, patient history, and epidemiological information. The expected result is Negative. Fact Sheet for Patients: HairSlick.no Fact Sheet for Healthcare Providers: quierodirigir.com This test is not yet approved or cleared by the [place] FDA and  has been authorized for detection and/or diagnosis of SARS-CoV-2 by FDA under an Emergency Use  Authorization (EUA). This EUA will remain  in effect (meaning this test can be used) for the duration of the COVID-19 declaration under Section 56 4(b)(1) of the Act, 21 U.S.C. section 360bbb-3(b)(1), unless the authorization is terminated or revoked sooner. Performed at [facility], [address] [place], [place] [address]      Labs: BNP (last 3 results) Recent Labs    09/23/19 1141  BNP 101.2*   Basic Metabolic Panel: Recent Labs  Lab 09/21/19 2041 09/21/19 2041 09/23/19 1141 09/23/19 1141 09/24/19 0919 09/25/19 0331 09/26/19 0338 09/26/19 0801 09/27/19 0434  NA 140   < > 138   < > 138  138 141 141  142 139  K 3.1*   < > 2.6*   < > 2.7* 3.5 3.3* 3.9  3.8 3.8  CL 102   < > 100  --  100 105 104  --  105  CO2 23   < > 22  --  23 23 22   --  23  GLUCOSE 180*   < > 142*  --  135* 119* 90  --  104*  BUN 9   < > 15  --  7 6 8   --  9  CREATININE 0.70   < > 0.83  --  0.78 0.75 0.81  --  0.88  CALCIUM 9.7   < > 9.9  --  9.0 8.9 8.9  --  9.1  MG 1.6*  --  2.0  --   --  2.3  --   --  1.9  PHOS  --   --   --   --   --  1.5*  --   --  5.1*   < > = values in this interval not displayed.   Liver Function Tests: Recent Labs  Lab 09/21/19 2041 09/23/19 1141 09/26/19 0338  AST 29 26 25   ALT 21 22 23   ALKPHOS 42 43 39  BILITOT 0.3 0.4 0.3  PROT 7.9 8.0 6.1*  ALBUMIN 4.5 4.4 3.2*   Recent Labs  Lab 09/21/19 2041 09/23/19 1141  LIPASE 23 32   No results for input(s): AMMONIA in the last 168 hours. CBC: Recent Labs  Lab 09/21/19 2041 09/21/19 2041 09/23/19 1141 09/24/19 0531 09/25/19 0331 09/26/19 0338 09/26/19 0801  WBC 11.2*  --  12.7* 18.0* 15.4* 8.2  --   HGB 14.1   < > 14.4 13.9 14.9 13.6 12.2  12.2  HCT 41.2   < > 42.0 40.2 43.0 40.8 36.0  36.0  MCV 90.7  --  89.0 90.1 89.8 91.7  --   PLT 247  --  308 302 279 269  --    < > = values in this interval not displayed.   Cardiac Enzymes: No results for input(s): CKTOTAL, CKMB, CKMBINDEX, TROPONINI  in the last 168 hours. BNP: Invalid input(s): POCBNP CBG: No results for input(s): GLUCAP in the last 168 hours. D-Dimer No results for input(s): DDIMER in the last 72 hours. Hgb A1c No results for input(s): HGBA1C in the last 72 hours. Lipid Profile No results for input(s): CHOL, HDL, LDLCALC, TRIG, CHOLHDL, LDLDIRECT in the last 72 hours. Thyroid function studies Recent Labs    09/25/19 1254  TSH 1.483   Anemia work up No results for input(s): VITAMINB12, FOLATE, FERRITIN, TIBC, IRON, RETICCTPCT in the last 72 hours. Urinalysis    Component Value Date/Time   COLORURINE BROWN (A) 09/23/2019 1248   APPEARANCEUR CLEAR 09/23/2019 1248   LABSPEC >1.030 (H) 09/23/2019 1248   PHURINE 6.0 09/23/2019 1248   GLUCOSEU NEGATIVE 09/23/2019 1248   HGBUR LARGE (A) 09/23/2019 1248   BILIRUBINUR SMALL (A) 09/23/2019 1248   BILIRUBINUR neg 11/29/2013 1015   KETONESUR 40 (A) 09/23/2019 1248   PROTEINUR >300 (A) 09/23/2019 1248   UROBILINOGEN 0.2 11/05/2014 1335   NITRITE NEGATIVE 09/23/2019 1248   LEUKOCYTESUR NEGATIVE 09/23/2019 1248   Sepsis Labs Invalid input(s): PROCALCITONIN,  WBC,  LACTICIDVEN Microbiology Recent Results (from the past 240 hour(s))  Urine culture     Status: Abnormal   Collection Time: 09/23/19 12:48 PM   Specimen: Urine, Random  Result Value Ref Range  Status   Specimen Description   Final    URINE, RANDOM Performed at [facility], [address] [place], [place] [address]    Special Requests   Final    NONE Performed at [facility], [address] [place], [place] [address]    Culture (A)  Final    <10,000 COLONIES/mL INSIGNIFICANT GROWTH Performed at [facility], [address] [place], [place] [address]    Report Status 09/24/2019 FINAL  Final  SARS CORONAVIRUS 2 (TAT 6-24 HRS) Nasopharyngeal Nasopharyngeal Swab     Status: None   Collection Time: 09/23/19  5:09 PM   Specimen: Nasopharyngeal Swab  Result Value Ref  Range Status   SARS Coronavirus 2 NEGATIVE NEGATIVE Final    Comment: (NOTE) SARS-CoV-2 target nucleic acids are NOT DETECTED. The SARS-CoV-2 RNA is generally detectable in upper and lower respiratory specimens during the acute phase of infection. Negative results do not preclude SARS-CoV-2 infection, do not rule out co-infections with other pathogens, and should not be used as the sole basis for treatment or other patient management decisions. Negative results must be combined with clinical observations, patient history, and epidemiological information. The expected result is Negative. Fact Sheet for Patients: HairSlick.no Fact Sheet for Healthcare Providers: quierodirigir.com This test is not yet approved or cleared by the [place] FDA and  has been authorized for detection and/or diagnosis of SARS-CoV-2 by FDA under an Emergency Use Authorization (EUA). This EUA will remain  in effect (meaning this test can be used) for the duration of the COVID-19 declaration under Section 56 4(b)(1) of the Act, 21 U.S.C. section 360bbb-3(b)(1), unless the authorization is terminated or revoked sooner. Performed at [facility], [address], [place], [place] [address]      Time coordinating discharge: 40 minutes  SIGNED:   Dorcas Carrow, MD  [facility] 09/27/2019, 1:06 PM

## 2019-09-27 NOTE — Progress Notes (Signed)
Subjective:  Feels well. Heart around 90-100 bpm  Objective:  Vital Signs in the last 24 hours: Temp:  [97.9 F (36.6 C)-98.2 F (36.8 C)] 98.2 F (36.8 C) (04/20 0503) Pulse Rate:  [72-114] 114 (04/20 0914) Resp:  [18] 18 (04/19 1600) BP: (97-100)/(70-77) 100/70 (04/20 0914) SpO2:  [100 %] 100 % (04/20 0503) Weight:  [77.3 kg] 77.3 kg (04/20 0503)  Intake/Output from previous day: 04/19 0701 - 04/20 0700 In: 1465.9 [P.O.:1080; IV Piggyback:385.9] Out: 3150 [Urine:3150]  Physical Exam  Constitutional: She is oriented to person, place, and time. She appears well-developed and well-nourished. No distress.  HENT:  Head: Normocephalic and atraumatic.  Eyes: Pupils are equal, round, and reactive to light. Conjunctivae are normal.  Neck: No JVD present.  Cardiovascular: Normal rate, regular rhythm and intact distal pulses.  No murmur heard. Pulmonary/Chest: Effort normal and breath sounds normal. She has no wheezes. She has no rales.  Abdominal: Soft. Bowel sounds are normal. There is no rebound.  Musculoskeletal:        General: No edema.  Lymphadenopathy:    She has no cervical adenopathy.  Neurological: She is alert and oriented to person, place, and time. No cranial nerve deficit.  Skin: Skin is warm and dry.  Psychiatric: She has a normal mood and affect.  Nursing note and vitals reviewed.    Lab Results: BMP Recent Labs    09/25/19 0331 09/25/19 0331 09/26/19 0338 09/26/19 0801 09/27/19 0434  NA 138   < > 141 141  142 139  K 3.5   < > 3.3* 3.9  3.8 3.8  CL 105  --  104  --  105  CO2 23  --  22  --  23  GLUCOSE 119*  --  90  --  104*  BUN 6  --  8  --  9  CREATININE 0.75  --  0.81  --  0.88  CALCIUM 8.9  --  8.9  --  9.1  GFRNONAA >60  --  >60  --  >60  GFRAA >60  --  >60  --  >60   < > = values in this interval not displayed.    CBC Recent Labs  Lab 09/26/19 0338 09/26/19 0338 09/26/19 0801  WBC 8.2  --   --   RBC 4.45  --   --   HGB 13.6   < >  12.2  12.2  HCT 40.8   < > 36.0  36.0  PLT 269  --   --   MCV 91.7  --   --   MCH 30.6  --   --   MCHC 33.3  --   --   RDW 12.7  --   --    < > = values in this interval not displayed.    HEMOGLOBIN A1C Lab Results  Component Value Date   HGBA1C 5.5 09/24/2019   MPG 111.15 09/24/2019    Cardiac Panel (last 3 results) Recent Labs    11/17/18 1503  TROPONINI <0.03    BNP (last 3 results) Recent Labs    09/23/19 1141  BNP 101.2*    TSH Recent Labs    09/25/19 1254  TSH 1.483    Lipid Panel     Component Value Date/Time   CHOL 147 09/24/2019 0919   CHOL 144 10/02/2017 1031   TRIG 130 09/24/2019 0919   HDL 61 09/24/2019 0919   HDL 56 10/02/2017 1031   CHOLHDL 2.4 09/24/2019  0919   VLDL 26 09/24/2019 0919   LDLCALC 60 09/24/2019 0919   LDLCALC 64 10/02/2017 1031     Hepatic Function Panel Recent Labs    09/21/19 2041 09/23/19 1141 09/26/19 0338  PROT 7.9 8.0 6.1*  ALBUMIN 4.5 4.4 3.2*  AST 29 26 25   ALT 21 22 23   ALKPHOS 42 43 39  BILITOT 0.3 0.4 0.3   Cardiac Studies:  EKG pending today:  RHC/LHC 09/26/2019: Normal coronaries Severe global hypokinesis, LVEF <15% Compensated nonischemic cardiomyopathy  Echocardiogram 09/25/2019: 1. Left ventricular ejection fraction, by estimation, is 15-20%. The left  ventricle has severely decreased function. The left ventricle demonstrates  regional wall motion abnormalities (see scoring diagram/findings for  description). Indeterminate  diastolic filling due to E-A fusion.  2. Right ventricular systolic function is normal. The right ventricular  size is normal. Tricuspid regurgitation signal is inadequate for assessing  PA pressure.  3. The mitral valve is grossly normal. No evidence of mitral valve  regurgitation. No evidence of mitral stenosis.  4. The aortic valve is tricuspid. Aortic valve regurgitation is not  visualized. No aortic stenosis is present.  5. The inferior vena cava is normal  in size with <50% respiratory  variability, suggesting right atrial pressure of 8 mmHg.   Assessment & Recommendations:  39 year old Caucasian female with nonischemic dilated cardiomyopathy, now with worsening EFrecovered LVEF  Acute on chronic systolic heart failure (HCC): HR around 90-100 bpm but otherwise compensated, both clinically and based on cath findings. Get EKG today. If QTc<500 msec, start corlanor at 5 mg bid. Continue Entresto 49-51 mg bid, spironolactone to 25 mg daily, metoprolol succinate 50 mg daily.  Counseled re: alcohol cessation. She can be discharged later today. F?u 4/29 10:30 with my partner Dr. 46.    5/29, M.D. [facility] Pager: [phone] Office: [phone]

## 2019-09-27 NOTE — Care Management (Signed)
1336 09-27-19 Case Manager provided patient with Sherryll Burger and [employer] discount cards. No further needs from Case Manager at this time. Gala Lewandowsky, RN,BSN Case Manager

## 2019-09-27 NOTE — Progress Notes (Signed)
Eligibility for [facility] submitted and pending

## 2019-10-06 ENCOUNTER — Ambulatory Visit: Payer: BC Managed Care – PPO | Admitting: Cardiology

## 2019-10-07 ENCOUNTER — Other Ambulatory Visit: Payer: Self-pay

## 2019-10-07 ENCOUNTER — Ambulatory Visit: Payer: BC Managed Care – PPO | Admitting: Cardiology

## 2019-10-07 ENCOUNTER — Encounter: Payer: Self-pay | Admitting: Cardiology

## 2019-10-07 VITALS — BP 127/79 | HR 81 | Temp 97.7°F | Ht 60.0 in | Wt 177.6 lb

## 2019-10-07 DIAGNOSIS — Z87898 Personal history of other specified conditions: Secondary | ICD-10-CM

## 2019-10-07 DIAGNOSIS — F1291 Cannabis use, unspecified, in remission: Secondary | ICD-10-CM

## 2019-10-07 DIAGNOSIS — I252 Old myocardial infarction: Secondary | ICD-10-CM

## 2019-10-07 DIAGNOSIS — I5022 Chronic systolic (congestive) heart failure: Secondary | ICD-10-CM

## 2019-10-07 DIAGNOSIS — Z8249 Family history of ischemic heart disease and other diseases of the circulatory system: Secondary | ICD-10-CM

## 2019-10-07 DIAGNOSIS — I429 Cardiomyopathy, unspecified: Secondary | ICD-10-CM

## 2019-10-07 DIAGNOSIS — Z09 Encounter for follow-up examination after completed treatment for conditions other than malignant neoplasm: Secondary | ICD-10-CM

## 2019-10-07 DIAGNOSIS — Z6834 Body mass index (BMI) 34.0-34.9, adult: Secondary | ICD-10-CM

## 2019-10-07 DIAGNOSIS — E6609 Other obesity due to excess calories: Secondary | ICD-10-CM

## 2019-10-07 DIAGNOSIS — Z87891 Personal history of nicotine dependence: Secondary | ICD-10-CM

## 2019-10-07 MED ORDER — SACUBITRIL-VALSARTAN 97-103 MG PO TABS: 1.0000 | ORAL_TABLET | Freq: Two times a day (BID) | ORAL | 0 refills | Status: DC

## 2019-10-07 NOTE — Progress Notes (Signed)
Patient is here for follow up visit.  Subjective:   Patient ID: Tina Colon, female    DOB: [birth year], 39 y.o.   MRN: [id number]   Chief Complaint  Patient presents with  . Chronic Systolic Heart Failure     hospital f/u     HPI   39 year old Caucasian female with nonischemic dilated cardiomyopathy who was recently hospitalized for epigastric chest pain and non-STEMI.  She was found to have worsening LV systolic function and regional wall motion normalities on echo consistent with cardiomyopathy.  She subsequently underwent left heart catheterization given her epigastric discomfort and was found to have normal epicardial coronary arteries.  Her underlying cardiomyopathy is most likely alcohol driven.  Patient was discharged from the hospital on September 27, 2019 and is here for follow-up.  Since hospital discharge patient states that she is doing well from a cardiovascular standpoint.  She denies any chest pain or shortness of breath at rest or with effort related activities.  She denies any weight gain.  And denies orthopnea, paroxysmal nocturnal dyspnea or lower extremity swelling.  Her medical therapy has been uptitrated prior to discharge.   Current Outpatient Medications on File Prior to Visit  Medication Sig Dispense Refill  . aspirin EC 81 MG tablet Take 81 mg by mouth daily.    Marland Kitchen atorvastatin (LIPITOR) 40 MG tablet Take 1 tablet (40 mg total) by mouth daily. 30 tablet 0  . b complex vitamins tablet Take 1 tablet by mouth daily.    . furosemide (LASIX) 20 MG tablet Take 1 tablet (20 mg total) by mouth as needed for edema. 60 tablet 3  . Hyoscyamine Sulfate SL 0.125 MG SUBL Take 0.125 mg by mouth as needed (muscle cramp / pain.).     Marland Kitchen ivabradine (CORLANOR) 5 MG TABS tablet Take 0.5 tablets (2.5 mg total) by mouth 2 (two) times daily with a meal. 30 tablet 0  . metoprolol succinate (TOPROL-XL) 50 MG 24 hr tablet Take 1 tablet (50 mg total) by mouth daily. 30 tablet 0  .  norgestimate-ethinyl estradiol (ORTHO-CYCLEN) 0.25-35 MG-MCG tablet Take 1 tablet by mouth daily.    . ondansetron (ZOFRAN ODT) 4 MG disintegrating tablet 4mg  ODT q4 hours prn nausea/vomit (Patient taking differently: Take 4 mg by mouth every 4 (four) hours as needed for nausea or vomiting. ) 4 tablet 0  . pantoprazole (PROTONIX) 40 MG tablet Take 1 tablet by mouth once daily 30 tablet 2  . spironolactone (ALDACTONE) 25 MG tablet Take 1 tablet (25 mg total) by mouth daily. 90 tablet 0   No current facility-administered medications on file prior to visit.    Cardiovascular studies:   EKG: 10/07/2019: Normal sinus rhythm, 70 bpm, diffuse T wave inversions suggestive of anterolateral and inferior ischemia.  Prior EKG dated 09/27/2019 sinus sinus rhythm, 77 bpm, T wave inversions in the anterolateral leads consistent with possible ischemia and T wave inversions in lead II.  Echocardiogram: Hospital echocardiogram 12/24/2017: Left ventricle: The cavity size was moderately to severely dilated. Systolic function was severely reduced. The estimated ejection fraction was 15-20%. Diffuse hypokinesis. Profound   diffuse hypokinesis throughout, with near akinesis of septal and anterior walls. - Aortic valve: Transvalvular velocity was within the normal range. There was no stenosis. There was no regurgitation. - Mitral valve: There was mild regurgitation. - Left atrium: The atrium was moderately to severely dilated. - Right ventricle: The cavity size was mildly dilated. Wall   thickness was normal. Systolic function  was normal. - Tricuspid valve: There was trivial regurgitation. - Pulmonic valve: There was no significant regurgitation. - Pericardium, extracardiac: A trivial pericardial effusion was   identified posterior to the heart.  Impressions: - Severely reduced ejection fraction of 15-20%. Global severe hypokinesis with near akinesis of anterior and septal walls. Severely dilated left atrium, mild MR. No  significant TR to estimate filling pressures.  05/19/2018: Left ventricle cavity is mildly dilated. Severe decrease in global wall motion. Visual EF is 20-25%.  Mild MR, mild TR, no pulmonary hypertension.    09/25/2019: LVEF to 15/20%, severely reduced left ventricular systolic function, regional wall motion abnormalities.  Cardiac MRI 07/23/2018: 1.  Mild left ventricular enlargement with borderline LVEF 50%. 2. There is no late gadolinium enhancement in the left ventricular myocardium. 3.  Normal right ventricular chamber size and function, RVEF 56%.  CTA coronary 03/23/2018: Calcium score 0.  No evidence of CAD.  RHC/LHC 09/26/2019: Normal coronaries Severe global hypokinesis, LVEF <15%  Recent Labs:  Lab Results  Component Value Date   WBC 8.2 09/26/2019   HGB 12.2 09/26/2019   HGB 12.2 09/26/2019   HCT 36.0 09/26/2019   HCT 36.0 09/26/2019   MCV 91.7 09/26/2019   PLT 269 09/26/2019   BMP Latest Ref Rng & Units 09/27/2019 09/26/2019 09/26/2019  Glucose 70 - 99 mg/dL 027(O) - -  BUN 6 - 20 mg/dL 9 - -  Creatinine 5.36 - 1.00 mg/dL 6.44 - -  BUN/Creat Ratio 9 - 23 - - -  Sodium 135 - 145 mmol/L 139 141 142  Potassium 3.5 - 5.1 mmol/L 3.8 3.9 3.8  Chloride 98 - 111 mmol/L 105 - -  CO2 22 - 32 mmol/L 23 - -  Calcium 8.9 - 10.3 mg/dL 9.1 - -   Lab Results  Component Value Date   MG 1.9 09/27/2019   MG 2.3 09/25/2019   MG 2.0 09/23/2019   BNP (last 3 results) Recent Labs    09/23/19 1141  BNP 101.2*    ProBNP (last 3 results) No results for input(s): PROBNP in the last 8760 hours.  Review of Systems  Constitution: Negative for chills and fever.  HENT: Negative for hoarse voice and nosebleeds.   Eyes: Negative for discharge, double vision and pain.  Cardiovascular: Negative for chest pain, claudication, dyspnea on exertion, leg swelling, near-syncope, orthopnea, palpitations, paroxysmal nocturnal dyspnea and syncope.  Respiratory: Negative for hemoptysis and  shortness of breath.   Musculoskeletal: Negative for muscle cramps and myalgias.  Gastrointestinal: Negative for abdominal pain, constipation, diarrhea, hematemesis, hematochezia, melena, nausea and vomiting.  Neurological: Negative for dizziness and light-headedness.       Objective:   Vitals:   10/07/19 0900  BP: 127/79  Pulse: 81  Temp: 97.7 F (36.5 C)  SpO2: 97%     Physical Exam  Constitutional: She appears well-developed and well-nourished.  Neck: No JVD present.  Cardiovascular: Normal rate, regular rhythm, normal heart sounds and intact distal pulses.  No murmur heard. Pulmonary/Chest: Effort normal and breath sounds normal. She has no wheezes. She has no rales.  Musculoskeletal:        General: No edema.  Nursing note and vitals reviewed.     Assessment & Recommendations:     ICD-10-CM   1. Chronic heart failure with reduced EF, stage C, NYHA class II:  I50.22 EKG 12-Lead    Basic Metabolic Panel (BMET)    Magnesium    Pro b natriuretic peptide (BNP)[facility])  Basic metabolic panel    Magnesium    Pro b natriuretic peptide (BNP)[facility])    Pro b natriuretic peptide (BNP)[facility])    Magnesium    Basic Metabolic Panel (BMET)    sacubitril-valsartan (ENTRESTO) 97-103 MG  2. Cardiomyopathy, unspecified type (HCC)  I42.9   3. Hx of non-ST elevation myocardial infarction (NSTEMI)  I25.2   4. Family history of premature coronary artery disease  Z82.49   5. Class 1 obesity due to excess calories with serious comorbidity and body mass index (BMI) of 34.0 to 34.9 in adult  E66.09    Z68.34   6. History of marijuana use  Z87.898   7. Former smoker  Z87.891   8. Hospital discharge follow-up  Z09     Syeda Prickett Pallett is a 39 y.o. female whose past medical history and cardiac risk factors include: Recurrence of nonischemic cardiomyopathy, Non-STEMI, family history of premature coronary  artery disease, obesity, history of marijuana use, EtOH dependence, history of pancreatitis, gastritis, former smoker.  Chronic heart failure with reduced EF, stage C, NYHA class II:  Medications reconciled.  Check baseline BMP, magnesium, and NT proBNP since patient has been discharged from the hospital.  Increase Entresto to 97/103 mg p.o. twice daily.  If patient is able to tolerate Entresto at the higher dose she is recommended to get repeat blood work a week later to reevaluate kidney function and electrolytes.  Educated on importance of complete alcohol cessation  Recommend daily weight check, strict I/O's  Fluid restriction to <2L per day, Na restriction < 1.5 g per day  Patient will need reevaluation of LV function in 90 days for further recommendations in regards to primary prevention for sudden cardiac death.  She verbalizes understanding.  Nonischemic cardiomyopathy: Most likely alcohol induced.  Management see above   Family history of premature coronary artery disease:  Continue guideline directed medical therapy.  EKG today shows diffuse T wave inversions in the anterolateral and inferior leads.  Today's EKG as well as the prior EKG were reviewed with the patient.  She denies any active symptoms of chest pain or shortness of breath and is currently euvolemic.  Continue to monitor.  Would recommend 2-week follow-up for further medication titration as her laboratory values and hemodynamics allow.  Tessa Lerner, [place], [facility]  Pager: [phone] Office: [phone]

## 2019-10-08 ENCOUNTER — Other Ambulatory Visit: Payer: Self-pay | Admitting: Cardiology

## 2019-10-08 DIAGNOSIS — I5022 Chronic systolic (congestive) heart failure: Secondary | ICD-10-CM

## 2019-10-08 LAB — MAGNESIUM: Magnesium: 1.8 mg/dL (ref 1.6–2.3)

## 2019-10-08 LAB — BASIC METABOLIC PANEL
BUN/Creatinine Ratio: 12 (ref 9–23)
BUN: 9 mg/dL (ref 6–24)
CO2: 20 mmol/L (ref 20–29)
Calcium: 9.4 mg/dL (ref 8.7–10.2)
Chloride: 107 mmol/L — ABNORMAL HIGH (ref 96–106)
Creatinine, Ser: 0.78 mg/dL (ref 0.57–1.00)
GFR calc Af Amer: 109 mL/min/{1.73_m2} (ref >59)
GFR calc non Af Amer: 95 mL/min/{1.73_m2} (ref >59)
Glucose: 102 mg/dL — ABNORMAL HIGH (ref 65–99)
Potassium: 4.1 mmol/L (ref 3.5–5.2)
Sodium: 143 mmol/L (ref 134–144)

## 2019-10-08 LAB — PRO B NATRIURETIC PEPTIDE: NT-Pro BNP: 2082 pg/mL — ABNORMAL HIGH (ref 0–130)

## 2019-10-08 MED ORDER — MAGNESIUM OXIDE 400 MG PO CAPS: 400.0000 mg | ORAL_CAPSULE | Freq: Two times a day (BID) | ORAL | 0 refills | Status: DC

## 2019-10-14 NOTE — Progress Notes (Signed)
Rescheduled

## 2019-10-18 LAB — MAGNESIUM: Magnesium: 1.9 mg/dL (ref 1.6–2.3)

## 2019-10-18 LAB — BASIC METABOLIC PANEL
BUN/Creatinine Ratio: 12 (ref 9–23)
BUN: 8 mg/dL (ref 6–24)
CO2: 23 mmol/L (ref 20–29)
Calcium: 9.2 mg/dL (ref 8.7–10.2)
Chloride: 106 mmol/L (ref 96–106)
Creatinine, Ser: 0.69 mg/dL (ref 0.57–1.00)
GFR calc Af Amer: 125 mL/min/{1.73_m2} (ref >59)
GFR calc non Af Amer: 108 mL/min/{1.73_m2} (ref >59)
Glucose: 95 mg/dL (ref 65–99)
Potassium: 3.9 mmol/L (ref 3.5–5.2)
Sodium: 141 mmol/L (ref 134–144)

## 2019-10-18 LAB — PRO B NATRIURETIC PEPTIDE: NT-Pro BNP: 482 pg/mL — ABNORMAL HIGH (ref 0–130)

## 2019-10-20 ENCOUNTER — Ambulatory Visit: Payer: BC Managed Care – PPO | Admitting: Cardiology

## 2019-10-20 DIAGNOSIS — I5022 Chronic systolic (congestive) heart failure: Secondary | ICD-10-CM

## 2019-10-20 NOTE — Progress Notes (Signed)
Left vm to cb.

## 2019-10-21 ENCOUNTER — Ambulatory Visit: Payer: BC Managed Care – PPO | Admitting: Cardiology

## 2019-10-21 ENCOUNTER — Telehealth: Payer: Self-pay

## 2019-10-21 ENCOUNTER — Encounter: Payer: Self-pay | Admitting: Cardiology

## 2019-10-21 ENCOUNTER — Other Ambulatory Visit: Payer: Self-pay

## 2019-10-21 VITALS — BP 128/76 | HR 75 | Temp 98.0°F | Resp 17 | Ht 60.0 in | Wt 179.0 lb

## 2019-10-21 DIAGNOSIS — I5022 Chronic systolic (congestive) heart failure: Secondary | ICD-10-CM

## 2019-10-21 MED ORDER — SACUBITRIL-VALSARTAN 97-103 MG PO TABS: 1.0000 | ORAL_TABLET | Freq: Two times a day (BID) | ORAL | 2 refills | Status: DC

## 2019-10-21 NOTE — Telephone Encounter (Signed)
Called pt no answer left a vm  to stop taking her aspirin per Dr. Rosemary Holms.

## 2019-10-21 NOTE — Progress Notes (Signed)
Patient is here for follow up visit.  Subjective:   Patient ID: Tina Colon, female    DOB: 1981-04-01, 39 y.o.   MRN: [id number]   Chief Complaint  Patient presents with  . Congestive Heart Failure    systolic  . Follow-up    2 week    HPI   39 year old Caucasian female with nonischemic dilated cardiomyopathy,HFrEF  Patient was hospitalized in April 2021 with abdominal pain and worsening shortness of breath.  Her EF was again found to be reduced at 10-15%, after prior recovery as noted on cardiac MRI with EF of 50%.  Acute trigger was thought to be resumption of alcohol intake and/or cessation of: Are due to cost issues.  Since hospital discharge, patient has been doing well.  At last visit, Delene Loll was increased to 97-103 mg in am and 49-51 mg in pm. She has had resolution of dyspnea, leg edema.    Current Outpatient Medications on File Prior to Visit  Medication Sig Dispense Refill  . atorvastatin (LIPITOR) 40 MG tablet Take 1 tablet (40 mg total) by mouth daily. 30 tablet 0  . b complex vitamins tablet Take 1 tablet by mouth daily.    . furosemide (LASIX) 20 MG tablet Take 1 tablet (20 mg total) by mouth as needed for edema. 60 tablet 3  . Hyoscyamine Sulfate SL 0.125 MG SUBL Take 0.125 mg by mouth as needed (muscle cramp / pain.).     Marland Kitchen ivabradine (CORLANOR) 5 MG TABS tablet Take 0.5 tablets (2.5 mg total) by mouth 2 (two) times daily with a meal. 30 tablet 0  . Magnesium Oxide 400 MG CAPS Take 1 capsule (400 mg total) by mouth 2 (two) times daily. (Patient taking differently: Take 400 mg by mouth daily. ) 60 capsule 0  . metoprolol succinate (TOPROL-XL) 50 MG 24 hr tablet Take 1 tablet (50 mg total) by mouth daily. 30 tablet 0  . ondansetron (ZOFRAN ODT) 4 MG disintegrating tablet 20m ODT q4 hours prn nausea/vomit (Patient taking differently: Take 4 mg by mouth every 4 (four) hours as needed for nausea or vomiting. ) 4 tablet 0  . pantoprazole (PROTONIX) 40 MG tablet  Take 1 tablet by mouth once daily 30 tablet 2  . spironolactone (ALDACTONE) 25 MG tablet Take 1 tablet (25 mg total) by mouth daily. 90 tablet 0  . TRI-LO-MILI 0.18/0.215/0.25 MG-25 MCG tab Take 1 tablet by mouth daily.     No current facility-administered medications on file prior to visit.    Cardiovascular studies:   EKG 10/07/2019: Sinus rhythm 70 bpm. Diffuse anterolateral T wave inversion. Consider ischemia.  RHC/LHC 09/26/2019: Normal coronaries Severe global hypokinesis, LVEF <15%  Echocardiogram 09/25/2019: LVEF to 15-20%, severely reduced left ventricular systolic function, regional wall motion abnormalities.  Cardiac MRI 07/23/2018: 1.  Mild left ventricular enlargement with borderline LVEF 50%. 2. There is no late gadolinium enhancement in the left ventricular myocardium. 3.  Normal right ventricular chamber size and function, RVEF 56%.  Echocardiogram 05/19/2018: 1. Left ventricle cavity is mildly dilated. Severe decrease in global wall motion. Visual EF is 20-25%. 2. Mild (Grade I) mitral regurgitation. 3. Mild tricuspid regurgitation. No evidence of pulmonary Hypertension. Heart failure with reduced ejection fraction, NYHA class II (I50.20)    Recent Labs:  10/17/2019: Glucose 95, BUN/Cr 8/0.69. EGFR normal. Na/K 141/3.9.  NT-pro BNP 482  10/07/2019: Glucose 102, BUN/Cr 9/0.78. EGFR 108. Na/K 143/4.1. Chloride 107. Rest of the CMP normal H/H 12.2/36. MCV 91.7.  Platelets 269. HbA1C 5.5% Chol 147, TG 130, HDL 61, LDL 60. TSH 1.483 normal MG 1.8  NT ProBNP 2,082  09/23/2019: BNP 101.2  02/07/2019: Glucose 88, BUN/Cr 10/0.77. EGFR >90. Na/K 137/4.0.  H/H 14/40. MCV 88. Platelets 269  Review of Systems  Cardiovascular: Negative for chest pain, dyspnea on exertion, leg swelling, palpitations and syncope.       Objective:   Vitals:   10/21/19 1526  BP: 128/76  Pulse: 75  Resp: 17  Temp: 98 F (36.7 C)  SpO2: 99%     Physical Exam  Constitutional:  No distress.  Neck: No JVD present.  Cardiovascular: Normal rate, regular rhythm, normal heart sounds and intact distal pulses.  No murmur heard. Pulmonary/Chest: Effort normal and breath sounds normal. She has no wheezes. She has no rales.  Musculoskeletal:        General: No edema.  Nursing note and vitals reviewed.     Assessment & Recommendations:  39 year old Caucasian female with nonischemic dilated cardiomyopathy, now with worsening EFrecovered LVEF  Chronic systolic heart failure ([place]): Nonischemic cardiomyopathy, NYHA class II.  Currently compensated. She had relapse of HFrEF in 09/2018 (EF down to 15-20%), after near complete recovery on GDMT (Cardiac MRI EF 50% in 07/2018) Increase Entresto to 97-103 mg bid Continue spironolactone to 25 mg daily, metoprolol succinate 50 mg daily, corlanor 5 mg bid. Will consider adding Farxiga at next visit. Repeat BMP, NT-pro BNP in 2 weeks. Repeat echocardiogram in August 2021. Counseled re: alcohol cessation and low salt diet. Stopped Aspirin given normal coronaries.  F/u in 4 weeks/   Regina Ganci Esther Hardy, MD [facility] Pager: [phone] Office: [phone]

## 2019-10-21 NOTE — Progress Notes (Signed)
Patient has appt today. 5/14

## 2019-11-23 ENCOUNTER — Encounter: Payer: Self-pay | Admitting: Cardiology

## 2019-11-23 ENCOUNTER — Other Ambulatory Visit: Payer: Self-pay

## 2019-11-23 ENCOUNTER — Ambulatory Visit: Payer: BC Managed Care – PPO | Admitting: Cardiology

## 2019-11-23 VITALS — BP 125/82 | HR 67 | Ht 60.0 in | Wt 176.4 lb

## 2019-11-23 DIAGNOSIS — I5022 Chronic systolic (congestive) heart failure: Secondary | ICD-10-CM

## 2019-11-23 MED ORDER — DAPAGLIFLOZIN PROPANEDIOL 10 MG PO TABS: 10.0000 mg | ORAL_TABLET | Freq: Every day | ORAL | 3 refills | Status: DC

## 2019-11-23 NOTE — Progress Notes (Signed)
Patient is here for follow up visit.  Subjective:   Patient ID: Tina Colon, female    DOB: 26-Jul-1980, 39 y.o.   MRN: [id number]   Chief Complaint  Patient presents with  . Chronic heart failure with reduced EF    HPI   39 year old Caucasian female with nonischemic dilated cardiomyopathy,HFrEF  Patient was hospitalized in April 2021 with abdominal pain and worsening shortness of breath.  Her EF was again found to be reduced at 10-15%, after prior recovery as noted on cardiac MRI with EF of 50%.  Acute trigger was thought to be resumption of alcohol intake and/or cessation of corlanor due to cost issues.  Patient is doing well on guideline directed medical therapy for heart failure. She has occasional leg edema, but denies dyspnea, orthopnea, PND symptoms. She is compliant with her medical theaypy.    Current Outpatient Medications on File Prior to Visit  Medication Sig Dispense Refill  . atorvastatin (LIPITOR) 40 MG tablet Take 1 tablet (40 mg total) by mouth daily. 30 tablet 0  . b complex vitamins tablet Take 1 tablet by mouth daily.    . furosemide (LASIX) 20 MG tablet Take 1 tablet (20 mg total) by mouth as needed for edema. 60 tablet 3  . Hyoscyamine Sulfate SL 0.125 MG SUBL Take 0.125 mg by mouth as needed (muscle cramp / pain.).     Marland Kitchen metoprolol succinate (TOPROL-XL) 50 MG 24 hr tablet Take 1 tablet (50 mg total) by mouth daily. 30 tablet 0  . ondansetron (ZOFRAN ODT) 4 MG disintegrating tablet 3m ODT q4 hours prn nausea/vomit (Patient taking differently: Take 4 mg by mouth every 4 (four) hours as needed for nausea or vomiting. ) 4 tablet 0  . pantoprazole (PROTONIX) 40 MG tablet Take 1 tablet by mouth once daily 30 tablet 2  . sacubitril-valsartan (ENTRESTO) 97-103 MG Take 1 tablet by mouth 2 (two) times daily. 180 tablet 2  . spironolactone (ALDACTONE) 25 MG tablet Take 1 tablet (25 mg total) by mouth daily. 90 tablet 0  . TRI-LO-MILI 0.18/0.215/0.25 MG-25 MCG tab  Take 1 tablet by mouth daily.     No current facility-administered medications on file prior to visit.    Cardiovascular studies:   EKG 10/07/2019: Sinus rhythm 70 bpm. Diffuse anterolateral T wave inversion. Consider ischemia.  RHC/LHC 09/26/2019: Normal coronaries Severe global hypokinesis, LVEF <15%  Echocardiogram 09/25/2019: LVEF to 15-20%, severely reduced left ventricular systolic function, regional wall motion abnormalities.  Cardiac MRI 07/23/2018: 1.  Mild left ventricular enlargement with borderline LVEF 50%. 2. There is no late gadolinium enhancement in the left ventricular myocardium. 3.  Normal right ventricular chamber size and function, RVEF 56%.  Echocardiogram 05/19/2018: 1. Left ventricle cavity is mildly dilated. Severe decrease in global wall motion. Visual EF is 20-25%. 2. Mild (Grade I) mitral regurgitation. 3. Mild tricuspid regurgitation. No evidence of pulmonary Hypertension. Heart failure with reduced ejection fraction, NYHA class II (I50.20)    Recent Labs:  10/17/2019: Glucose 95, BUN/Cr 8/0.69. EGFR normal. Na/K 141/3.9.  NT-pro BNP 482  10/07/2019: Glucose 102, BUN/Cr 9/0.78. EGFR 108. Na/K 143/4.1. Chloride 107. Rest of the CMP normal H/H 12.2/36. MCV 91.7. Platelets 269. HbA1C 5.5% Chol 147, TG 130, HDL 61, LDL 60. TSH 1.483 normal MG 1.8  NT ProBNP 2,082  09/23/2019: BNP 101.2  02/07/2019: Glucose 88, BUN/Cr 10/0.77. EGFR >90. Na/K 137/4.0.  H/H 14/40. MCV 88. Platelets 269  Review of Systems  Cardiovascular: Negative for chest pain,  dyspnea on exertion, leg swelling, palpitations and syncope.       Objective:    Vitals:   11/23/19 1453  BP: 125/82  Pulse: 67  SpO2: 99%     Physical Exam Vitals and nursing note reviewed.  Constitutional:      General: She is not in acute distress. Neck:     Vascular: No JVD.  Cardiovascular:     Rate and Rhythm: Normal rate and regular rhythm.     Pulses: Intact distal pulses.      Heart sounds: Normal heart sounds. No murmur heard.   Pulmonary:     Effort: Pulmonary effort is normal.     Breath sounds: Normal breath sounds. No wheezing or rales.       Assessment & Recommendations:  39 year old Caucasian female with nonischemic dilated cardiomyopathy, now with worsening EFrecovered LVEF  Chronic systolic heart failure ([employer]): Nonischemic cardiomyopathy, NYHA class II.  Currently compensated. She had relapse of HFrEF in 09/2018 (EF down to 15-20%), after near complete recovery on GDMT (Cardiac MRI EF 50% in 07/2018) Currently on Entresto to 97-103 mg bid, spironolactone to 25 mg daily, metoprolol succinate 50 mg daily, corlanor 5 mg bid. Started Farxiga 10 mg daily. Discussed risks/benefits/side effects. Avoid dehydration. Check BMP, NT-pro BNP, echocardiogram  F/u in 1 month   [place], MD [facility] Pager: [phone] Office: [phone]

## 2019-12-01 ENCOUNTER — Other Ambulatory Visit: Payer: Self-pay

## 2019-12-01 ENCOUNTER — Ambulatory Visit: Payer: BC Managed Care – PPO

## 2019-12-01 DIAGNOSIS — I5022 Chronic systolic (congestive) heart failure: Secondary | ICD-10-CM

## 2019-12-15 ENCOUNTER — Other Ambulatory Visit: Payer: Self-pay

## 2019-12-15 DIAGNOSIS — I5022 Chronic systolic (congestive) heart failure: Secondary | ICD-10-CM

## 2019-12-15 MED ORDER — METOPROLOL SUCCINATE ER 50 MG PO TB24: 50.0000 mg | ORAL_TABLET | Freq: Every day | ORAL | 11 refills | Status: DC

## 2019-12-19 ENCOUNTER — Other Ambulatory Visit (HOSPITAL_COMMUNITY): Payer: Self-pay | Admitting: Cardiology

## 2019-12-19 ENCOUNTER — Other Ambulatory Visit: Payer: Self-pay

## 2019-12-19 MED ORDER — MAGNESIUM OXIDE 400 MG PO CAPS: 400.0000 mg | ORAL_CAPSULE | Freq: Two times a day (BID) | ORAL | 2 refills | Status: AC

## 2019-12-20 LAB — PRO B NATRIURETIC PEPTIDE: NT-Pro BNP: 60 pg/mL (ref 0–130)

## 2019-12-20 LAB — BASIC METABOLIC PANEL
BUN/Creatinine Ratio: 12 (ref 9–23)
BUN: 8 mg/dL (ref 6–24)
CO2: 21 mmol/L (ref 20–29)
Calcium: 9.3 mg/dL (ref 8.7–10.2)
Chloride: 106 mmol/L (ref 96–106)
Creatinine, Ser: 0.66 mg/dL (ref 0.57–1.00)
GFR calc Af Amer: 127 mL/min/{1.73_m2} (ref >59)
GFR calc non Af Amer: 110 mL/min/{1.73_m2} (ref >59)
Glucose: 96 mg/dL (ref 65–99)
Potassium: 3.5 mmol/L (ref 3.5–5.2)
Sodium: 141 mmol/L (ref 134–144)

## 2019-12-22 ENCOUNTER — Other Ambulatory Visit: Payer: Self-pay | Admitting: Nurse Practitioner

## 2019-12-22 DIAGNOSIS — K2970 Gastritis, unspecified, without bleeding: Secondary | ICD-10-CM

## 2019-12-27 NOTE — Progress Notes (Signed)
Patient is here for follow up visit.  Subjective:   Patient ID: Tina Colon, female    DOB: 01/10/1981, 39 y.o.   MRN: [id number]   Chief Complaint  Patient presents with  . Congestive Heart Failure  . Follow-up    1 month    HPI   39 year old Caucasian female with nonischemic dilated cardiomyopathy,HFrEF  Patient was hospitalized in April 2021 with abdominal pain and worsening shortness of breath.  Her EF was again found to be reduced at 10-15%, after prior recovery as noted on cardiac MRI with EF of 50%.  Acute trigger was thought to be resumption of alcohol intake and/or cessation of corlanor due to cost issues.  Patient is doing well on guideline directed medical therapy for heart failure. She has occasional leg edema, but denies dyspnea, orthopnea, PND symptoms. She is compliant with her medical theaypy.    Discussed echocardiogram (12/2019) results with the patient, details below.    Current Outpatient Medications on File Prior to Visit  Medication Sig Dispense Refill  . atorvastatin (LIPITOR) 40 MG tablet Take 1 tablet (40 mg total) by mouth daily. (Patient not taking: Reported on 11/23/2019) 30 tablet 0  . b complex vitamins tablet Take 1 tablet by mouth daily.    Steffanie Dunn 5 MG TABS tablet Take 5 mg by mouth 2 (two) times daily.    . dapagliflozin propanediol (FARXIGA) 10 MG TABS tablet Take 1 tablet (10 mg total) by mouth daily before breakfast. 30 tablet 3  . Hyoscyamine Sulfate SL 0.125 MG SUBL Take 0.125 mg by mouth as needed (muscle cramp / pain.).     . Magnesium Oxide 400 MG CAPS Take 1 capsule (400 mg total) by mouth 2 (two) times daily. 60 capsule 2  . metoprolol succinate (TOPROL-XL) 50 MG 24 hr tablet Take 1 tablet (50 mg total) by mouth daily. 30 tablet 11  . ondansetron (ZOFRAN ODT) 4 MG disintegrating tablet 42m ODT q4 hours prn nausea/vomit (Patient taking differently: Take 4 mg by mouth every 4 (four) hours as needed for nausea or vomiting. ) 4 tablet  0  . pantoprazole (PROTONIX) 40 MG tablet Take 1 tablet (40 mg total) by mouth daily. Office visit for further refills 30 tablet 2  . sacubitril-valsartan (ENTRESTO) 97-103 MG Take 1 tablet by mouth 2 (two) times daily. 180 tablet 2  . spironolactone (ALDACTONE) 25 MG tablet Take 1 tablet (25 mg total) by mouth daily. 90 tablet 0  . TRI-LO-MILI 0.18/0.215/0.25 MG-25 MCG tab Take 1 tablet by mouth daily.     No current facility-administered medications on file prior to visit.    Cardiovascular studies:   Echocardiogram 12/01/2019:  Normal LV systolic function with visual EF 50-55%. Left ventricle cavity  is normal in size. Normal global wall motion. Normal diastolic filling  pattern, normal LAP. Calculated EF 54%.  Mild (Grade I) aortic regurgitation.  Mild (Grade I) mitral regurgitation.  Mild tricuspid regurgitation.  Mild pulmonic regurgitation.  Compared to prior echo 09/25/2019, LVEF has improved from 15-20% to  50-55%.   EKG 10/07/2019: Sinus rhythm 70 bpm. Diffuse anterolateral T wave inversion. Consider ischemia.  RHC/LHC 09/26/2019: Normal coronaries Severe global hypokinesis, LVEF <15%  Echocardiogram 09/25/2019: LVEF to 15-20%, severely reduced left ventricular systolic function, regional wall motion abnormalities.  Cardiac MRI 07/23/2018: 1.  Mild left ventricular enlargement with borderline LVEF 50%. 2. There is no late gadolinium enhancement in the left ventricular myocardium. 3.  Normal right ventricular chamber size and  function, RVEF 56%.  Echocardiogram 05/19/2018: 1. Left ventricle cavity is mildly dilated. Severe decrease in global wall motion. Visual EF is 20-25%. 2. Mild (Grade I) mitral regurgitation. 3. Mild tricuspid regurgitation. No evidence of pulmonary Hypertension. Heart failure with reduced ejection fraction, NYHA class II (I50.20)    Recent Labs:  12/19/2019: Glucose 96, BUN/Cr 8/0.66. EGFR normal. Na/K 141/3.5.  NT-pro BNP  60  10/17/2019: Glucose 95, BUN/Cr 8/0.69. EGFR normal. Na/K 141/3.9.  NT-pro BNP 482  10/07/2019: Glucose 102, BUN/Cr 9/0.78. EGFR 108. Na/K 143/4.1. Chloride 107. Rest of the CMP normal H/H 12.2/36. MCV 91.7. Platelets 269. HbA1C 5.5% Chol 147, TG 130, HDL 61, LDL 60. TSH 1.483 normal MG 1.8  NT ProBNP 2,082  09/23/2019: BNP 101.2  02/07/2019: Glucose 88, BUN/Cr 10/0.77. EGFR >90. Na/K 137/4.0.  H/H 14/40. MCV 88. Platelets 269  Review of Systems  Cardiovascular: Negative for chest pain, dyspnea on exertion, leg swelling, palpitations and syncope.       Objective:    Vitals:   12/28/19 1126  BP: 111/66  Pulse: 68  Resp: 17  SpO2: 97%     Physical Exam Vitals and nursing note reviewed.  Constitutional:      General: She is not in acute distress. Neck:     Vascular: No JVD.  Cardiovascular:     Rate and Rhythm: Normal rate and regular rhythm.     Pulses: Intact distal pulses.     Heart sounds: Normal heart sounds. No murmur heard.   Pulmonary:     Effort: Pulmonary effort is normal.     Breath sounds: Normal breath sounds. No wheezing or rales.       Assessment & Recommendations:  39 year old Caucasian female with nonischemic dilated cardiomyopathy, now with worsening EFrecovered LVEF  Chronic systolic heart failure ([place]): Nonischemic cardiomyopathy, NYHA class II.  Currently compensated.  She had relapse of HFrEF in 09/2018 (EF down to 15-20%), after near complete recovery on GDMT (Cardiac MRI EF 50% in 07/2018) LVEF recovered to normal (12/2019) Currently on Entresto to 97-103 mg bid, spironolactone to 25 mg daily, metoprolol succinate 50 mg daily, corlanor 5 mg bid, Farxiga 10 mg daily.   F/u in 6 months   Tondra Reierson Esther [place], MD [facility] [place] Pager: [phone] Office: [phone]

## 2019-12-28 ENCOUNTER — Other Ambulatory Visit: Payer: Self-pay

## 2019-12-28 ENCOUNTER — Ambulatory Visit: Payer: BC Managed Care – PPO | Admitting: Cardiology

## 2019-12-28 ENCOUNTER — Encounter: Payer: Self-pay | Admitting: Cardiology

## 2019-12-28 VITALS — BP 111/66 | HR 68 | Resp 17 | Ht 60.0 in | Wt 175.0 lb

## 2019-12-28 DIAGNOSIS — I5022 Chronic systolic (congestive) heart failure: Secondary | ICD-10-CM

## 2019-12-28 MED ORDER — SPIRONOLACTONE 25 MG PO TABS: 25.0000 mg | ORAL_TABLET | Freq: Every day | ORAL | 3 refills | Status: DC

## 2019-12-28 MED ORDER — METOPROLOL SUCCINATE ER 50 MG PO TB24: 50.0000 mg | ORAL_TABLET | Freq: Every day | ORAL | 3 refills | Status: DC

## 2019-12-28 MED ORDER — CORLANOR 5 MG PO TABS: 5.0000 mg | ORAL_TABLET | Freq: Two times a day (BID) | ORAL | 3 refills | Status: DC

## 2019-12-28 MED ORDER — DAPAGLIFLOZIN PROPANEDIOL 10 MG PO TABS: 10.0000 mg | ORAL_TABLET | Freq: Every day | ORAL | 3 refills | Status: DC

## 2019-12-28 MED ORDER — SACUBITRIL-VALSARTAN 97-103 MG PO TABS: 1.0000 | ORAL_TABLET | Freq: Two times a day (BID) | ORAL | 0 refills | Status: DC

## 2019-12-29 ENCOUNTER — Encounter: Payer: Self-pay | Admitting: Cardiology

## 2020-02-19 ENCOUNTER — Other Ambulatory Visit: Payer: Self-pay | Admitting: Cardiology

## 2020-02-19 DIAGNOSIS — I5022 Chronic systolic (congestive) heart failure: Secondary | ICD-10-CM

## 2020-02-27 ENCOUNTER — Telehealth: Payer: Self-pay

## 2020-02-27 NOTE — Telephone Encounter (Signed)
Received a call from patient in regarding working out. Patient would like to know if she can continue working out and if there are any parameters she should follow.

## 2020-02-27 NOTE — Telephone Encounter (Signed)
Received a call from patient in regarding working out. Patient would like to know if she can continue working out and if there are any parameters she should follow. Please advise. Thanks!

## 2020-02-27 NOTE — Telephone Encounter (Signed)
Yes. Okay to exercise as tolerated. HR up to 150 bpm is okay to achieve, as long as no symptoms develop- such as chest pain, shortness of breath.  Thanks MJP

## 2020-02-28 NOTE — Telephone Encounter (Signed)
Error

## 2020-02-28 NOTE — Telephone Encounter (Signed)
Left detailed voice message regarding exercise.

## 2020-03-27 ENCOUNTER — Other Ambulatory Visit: Payer: Self-pay | Admitting: Nurse Practitioner

## 2020-03-27 DIAGNOSIS — K2970 Gastritis, unspecified, without bleeding: Secondary | ICD-10-CM

## 2020-04-03 ENCOUNTER — Telehealth: Payer: Self-pay | Admitting: Gastroenterology

## 2020-04-03 DIAGNOSIS — K2970 Gastritis, unspecified, without bleeding: Secondary | ICD-10-CM

## 2020-04-03 MED ORDER — PANTOPRAZOLE SODIUM 40 MG PO TBEC: 40.0000 mg | DELAYED_RELEASE_TABLET | Freq: Every day | ORAL | 2 refills | Status: DC

## 2020-04-03 NOTE — Telephone Encounter (Signed)
Refilled patiens protonix with two refills , patient will need an office appt for any further refills

## 2020-06-11 ENCOUNTER — Telehealth: Payer: Self-pay | Admitting: Cardiology

## 2020-06-29 ENCOUNTER — Ambulatory Visit: Payer: BC Managed Care – PPO | Admitting: Cardiology

## 2020-07-01 ENCOUNTER — Other Ambulatory Visit: Payer: Self-pay | Admitting: Cardiology

## 2020-07-06 ENCOUNTER — Other Ambulatory Visit: Payer: Self-pay | Admitting: Gastroenterology

## 2020-07-06 DIAGNOSIS — K2970 Gastritis, unspecified, without bleeding: Secondary | ICD-10-CM

## 2020-07-07 ENCOUNTER — Other Ambulatory Visit: Payer: Self-pay | Admitting: Nurse Practitioner

## 2020-07-07 DIAGNOSIS — K2970 Gastritis, unspecified, without bleeding: Secondary | ICD-10-CM

## 2020-07-12 ENCOUNTER — Other Ambulatory Visit: Payer: Self-pay | Admitting: Gastroenterology

## 2020-07-12 DIAGNOSIS — K2970 Gastritis, unspecified, without bleeding: Secondary | ICD-10-CM

## 2020-07-18 ENCOUNTER — Ambulatory Visit: Payer: BC Managed Care – PPO | Admitting: Physician Assistant

## 2020-07-19 IMAGING — DX DG CHEST 1V PORT
1 series · 1 of 1 positions shown · non-contrast
Comparison: December 23, 2017.

CLINICAL DATA: Chest pain.

EXAM:
PORTABLE CHEST 1 VIEW

[chest ap]
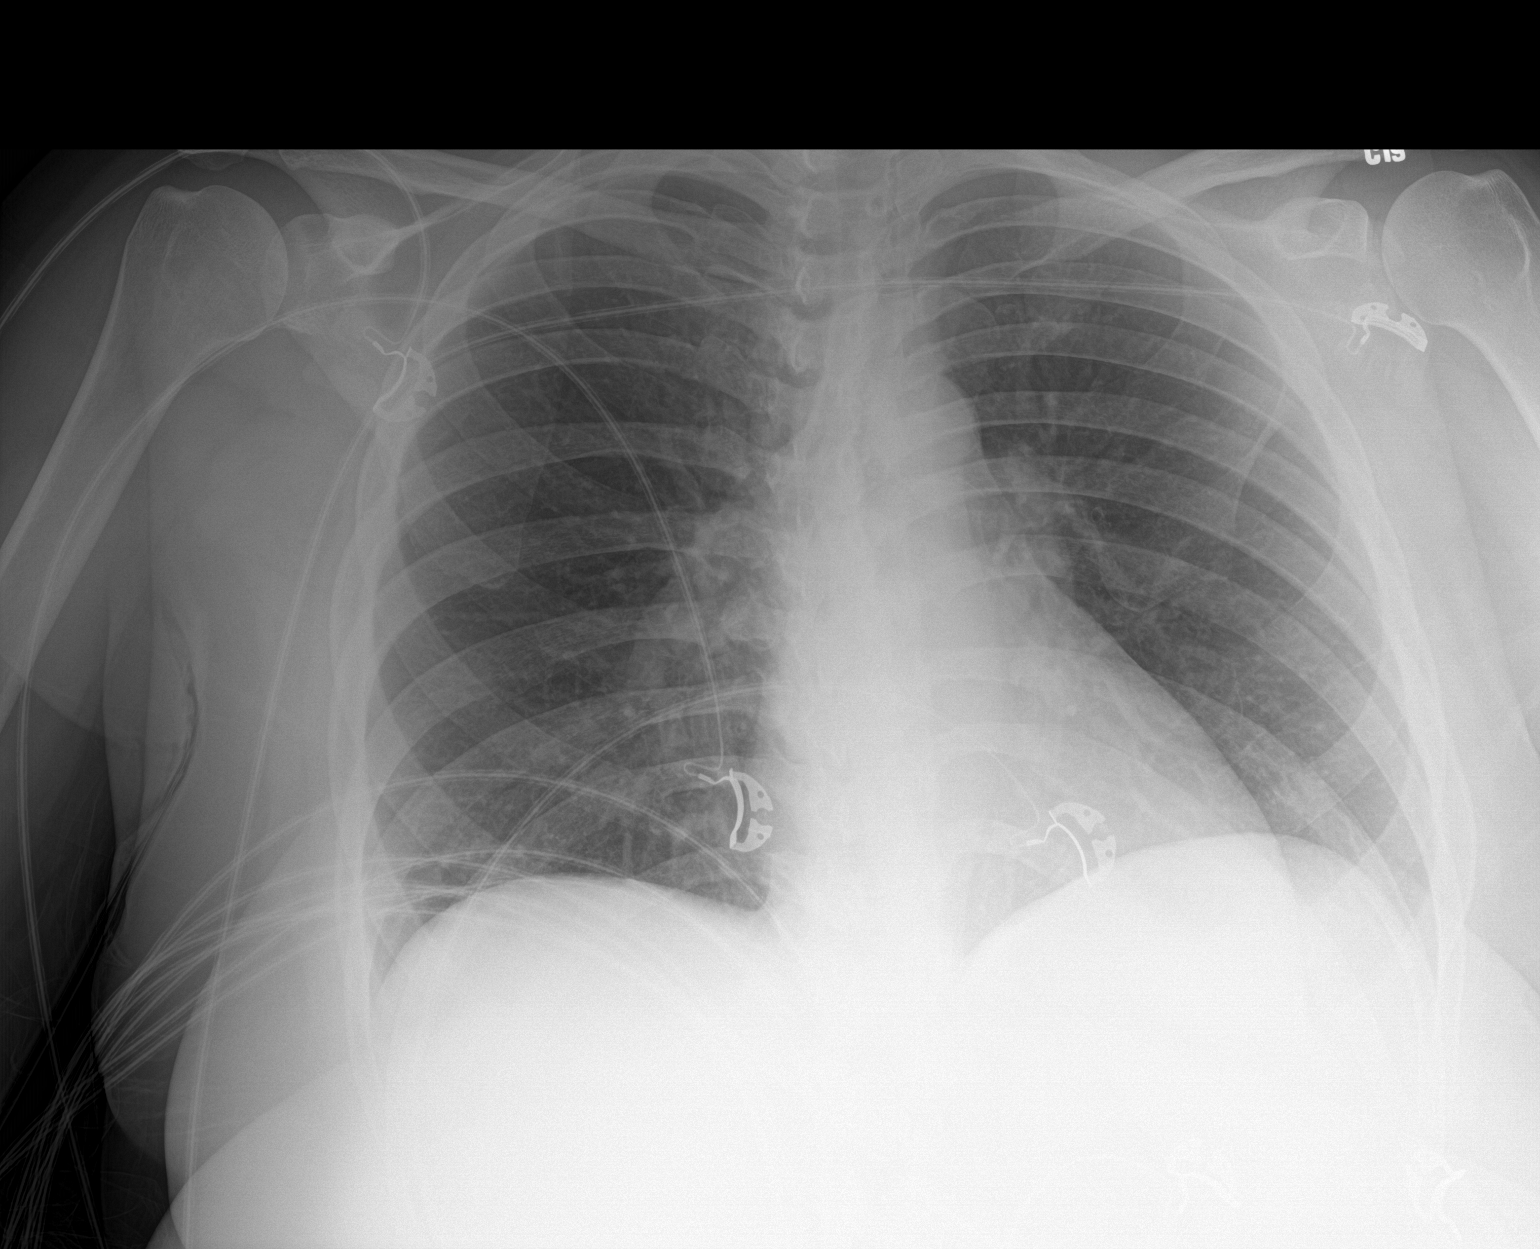

[1 of 1 positions shown; findings below may reference images not displayed]

FINDINGS: The heart size and mediastinal contours are within normal limits.
Both lungs are clear. No pneumothorax or pleural effusion is noted.
The visualized skeletal structures are unremarkable.
IMPRESSION: No active disease.

## 2020-07-25 ENCOUNTER — Other Ambulatory Visit: Payer: Self-pay | Admitting: Cardiology

## 2020-07-25 DIAGNOSIS — I5022 Chronic systolic (congestive) heart failure: Secondary | ICD-10-CM

## 2020-07-26 ENCOUNTER — Ambulatory Visit: Payer: BC Managed Care – PPO | Admitting: Cardiology

## 2020-07-29 NOTE — Progress Notes (Signed)
07/29/2020 Tina Colon [id number] Feb 19, 1981   Chief Complaint: Rectal pain, Pantoprazole refill.   History of Present Illness: Tina Colon is a 40 year old female with a past medical history of nonischemic dilated cardiomyopathy, CHF, EtOH abuse, pancreatitis, GERD, RUQ pain with N/V.   She is followed by Dr. Lavon Paganini. She was last seen in our office by [name] on 12/02/2018 for further evaluation for intermittent N/V and RUQ pain. EGD in 2017 done for the same symptoms was unrevealing. She was prescribed Levsin and Protonix and her RUQ pain and episodes of N/V significantly improved. She also noted having rectal bleeding for a few days and a future colonoscopy was discussed. She presents to our office today for further evaluation regarding recta pain and to refill her her Pantoprazole prescription. No further vomiting or RUQ pain since her last office visit. However, she has nausea before she passes a BM which resolves after she finishes passing a normal brown formed stool without straining. No recta bleeding. She complains of having rectal pain which started about one year ago but has progressively worsened over the past few months. She has rectal pain 2 ot 3 days week which is worse after passing a BM. She denies straining. Stools are softly formed. No further rectal bleeding. History of alcohol abuse. She denies excessive alcohol intake but reports drinking 1 or 2 beers weekly.  History of cardiomyopathy on Entresto. She continues Q 6 month follow up with her cardiologist Dr. Rosemary Holms. Last ECHO 12/01/2019 showed LV EF 50-55%.   Echocardiogram 12/01/2019:  Normal LV systolic function with visual EF 50-55%. Left ventricle cavity  is normal in size. Normal global wall motion. Normal diastolic filling  pattern, normal LAP. Calculated EF 54%.  Mild (Grade I) aortic regurgitation.  Mild (Grade I) mitral regurgitation.  Mild tricuspid regurgitation.  Mild pulmonic  regurgitation.  Compared to prior echo 09/25/2019, LVEF has improved from 15-20% to 50-55%.   RUQ sonogram 11/17/2018: Gallbladder:Questionable minimal layering sludge in the nondistended gallbladder. No convincing gallstones. No gallbladder wall thickening. No pericholecystic fluid. No sonographic Murphy sign. Common bile duct: Diameter: 4 mm Liver:No focal lesion identified. Within normal limits in parenchymal echogenicity. Portal vein is patent on color Doppler imaging with normal direction of blood flow towards the liver. IMPRESSION: 1. Questionable minimal layering sludge in the gallbladder, with no evidence of cholelithiasis or acute cholecystitis. 2. No biliary ductal dilatation. 3. Normal liver.  EGD 06/19/2015: Normal appearing esophagus and GE junction, the stomach was well visualized and normal in appearance, normal appearing duodenum  Current Outpatient Medications on File Prior to Visit  Medication Sig Dispense Refill  . CORLANOR 5 MG TABS tablet Take 1 tablet (5 mg total) by mouth 2 (two) times daily. 180 tablet 3  . ENTRESTO 97-103 MG TAKE 1 TABLET BY MOUTH  TWICE DAILY 180 tablet 0  . FARXIGA 10 MG TABS tablet TAKE 1 TABLET BY MOUTH ONCE DAILY BEFORE BREAKFAST 30 tablet 0  . magnesium oxide (MAG-OX) 400 MG tablet Take 1 tablet by mouth twice daily 60 tablet 0  . metoprolol succinate (TOPROL-XL) 50 MG 24 hr tablet Take 1 tablet (50 mg total) by mouth daily. 90 tablet 3  . ondansetron (ZOFRAN ODT) 4 MG disintegrating tablet 4mg  ODT q4 hours prn nausea/vomit 4 tablet 0  . spironolactone (ALDACTONE) 25 MG tablet Take 1 tablet (25 mg total) by mouth daily. 90 tablet 3  . TRI-LO-MILI 0.18/0.215/0.25 MG-25 MCG tab Take 1 tablet by  mouth daily.     No current facility-administered medications on file prior to visit.   Allergies  Allergen Reactions  . Penicillins Anaphylaxis    Has patient had a PCN reaction causing immediate rash, facial/tongue/throat swelling, SOB or  lightheadedness with hypotension:Yes Has patient had a PCN reaction causing severe rash involving mucus membranes or skin necrosis:unknown Has patient had a PCN reaction that required hospitalization:Yes Has patient had a PCN reaction occurring within the last 10 years:No If all of the above answers are "NO", then may proceed with Cephalosporin use.     Current Medications, Allergies, Past Medical History, Past Surgical History, Family History and Social History were reviewed in [employer] record.   Review of Systems:   Constitutional: Negative for fever, sweats, chills or weight loss.  Respiratory: Negative for shortness of breath.   Cardiovascular: Negative for chest pain, palpitations and leg swelling.  Gastrointestinal: See HPI.  Musculoskeletal: Negative for back pain or muscle aches.  Neurological: Negative for dizziness, headaches or paresthesias.   Physical Exam: BP 124/77   Pulse 80   Ht 5' (1.524 m)   Wt 174 lb 3.2 oz (79 kg)   SpO2 98%   BMI 34.02 kg/m   General: 40 year old female in no acute distress. Head: Normocephalic and atraumatic. Eyes: No scleral icterus. Conjunctiva pink . Ears: Normal auditory acuity. Mouth: Dentition intact. No ulcers or lesions.  Lungs: Clear throughout to auscultation. Heart: Regular rate and rhythm, no murmur. Abdomen: Soft, nontender and nondistended. No masses or hepatomegaly. Normal bowel sounds x 4 quadrants.  Rectal: Posterior anal fissure tender without bleeding, no obvious abscess. Small anterior external hemorrhoids. Limited rectal exam due to pain. CMA Rovanda present during exam.  Musculoskeletal: Symmetrical with no gross deformities. Extremities: No edema. Neurological: Alert oriented x 4. No focal deficits.  Psychological: Alert and cooperative. Normal mood and affect  Assessment and Recommendations:  2. 40 year old female with rectal pain. Posterior fissure with anterior external hemorrhoids  assessed on exam today. -CBC, CMP -Diltiazem 2%/Lidocaine 2% fissure ointment apply a small amount inside and to the external anal area tid x 6 weeks -Miralax Q HS as needed -Sitz bath bid PRN -Patient to call office if symptoms worsen -Follow up in office in 6 weeks to assess fissure healing and to schedule a colonoscopy   2. RUQ pain/N/V episodes stable on Pantoprazole 40mg  QD -Continue Pantoprazole 40mg  QD  3. Cardiomyopathy on Entresto. Last ECHO 6/201 showed LV EF 50 - 55%  4. History of alcohol abuse -No alcohol discussed with the patient

## 2020-07-30 ENCOUNTER — Other Ambulatory Visit: Payer: Self-pay

## 2020-07-30 ENCOUNTER — Other Ambulatory Visit (INDEPENDENT_AMBULATORY_CARE_PROVIDER_SITE_OTHER): Payer: BC Managed Care – PPO

## 2020-07-30 ENCOUNTER — Ambulatory Visit (INDEPENDENT_AMBULATORY_CARE_PROVIDER_SITE_OTHER): Payer: BC Managed Care – PPO | Admitting: Nurse Practitioner

## 2020-07-30 ENCOUNTER — Encounter: Payer: Self-pay | Admitting: Nurse Practitioner

## 2020-07-30 VITALS — BP 124/77 | HR 80 | Ht 60.0 in | Wt 174.2 lb

## 2020-07-30 DIAGNOSIS — K602 Anal fissure, unspecified: Secondary | ICD-10-CM

## 2020-07-30 DIAGNOSIS — K6289 Other specified diseases of anus and rectum: Secondary | ICD-10-CM

## 2020-07-30 DIAGNOSIS — K219 Gastro-esophageal reflux disease without esophagitis: Secondary | ICD-10-CM

## 2020-07-30 DIAGNOSIS — R1011 Right upper quadrant pain: Secondary | ICD-10-CM

## 2020-07-30 LAB — CBC WITH DIFFERENTIAL/PLATELET
Basophils Absolute: 0 10*3/uL (ref 0.0–0.1)
Basophils Relative: 0.3 % (ref 0.0–3.0)
Eosinophils Absolute: 0.2 10*3/uL (ref 0.0–0.7)
Eosinophils Relative: 2.2 % (ref 0.0–5.0)
HCT: 39.2 % (ref 36.0–46.0)
Hemoglobin: 13.3 g/dL (ref 12.0–15.0)
Lymphocytes Relative: 32.8 % (ref 12.0–46.0)
Lymphs Abs: 2.7 10*3/uL (ref 0.7–4.0)
MCHC: 34 g/dL (ref 30.0–36.0)
MCV: 89.5 fl (ref 78.0–100.0)
Monocytes Absolute: 0.4 10*3/uL (ref 0.1–1.0)
Monocytes Relative: 5.4 % (ref 3.0–12.0)
Neutro Abs: 4.8 10*3/uL (ref 1.4–7.7)
Neutrophils Relative %: 59.3 % (ref 43.0–77.0)
Platelets: 228 10*3/uL (ref 150.0–400.0)
RBC: 4.38 Mil/uL (ref 3.87–5.11)
RDW: 13.6 % (ref 11.5–15.5)
WBC: 8.1 10*3/uL (ref 4.0–10.5)

## 2020-07-30 LAB — COMPREHENSIVE METABOLIC PANEL
ALT: 10 U/L (ref 0–35)
AST: 12 U/L (ref 0–37)
Albumin: 3.9 g/dL (ref 3.5–5.2)
Alkaline Phosphatase: 39 U/L (ref 39–117)
BUN: 13 mg/dL (ref 6–23)
CO2: 28 mEq/L (ref 19–32)
Calcium: 9.3 mg/dL (ref 8.4–10.5)
Chloride: 103 mEq/L (ref 96–112)
Creatinine, Ser: 0.66 mg/dL (ref 0.40–1.20)
GFR: 108.62 mL/min (ref >60.00)
Glucose, Bld: 87 mg/dL (ref 70–99)
Potassium: 3.7 mEq/L (ref 3.5–5.1)
Sodium: 138 mEq/L (ref 135–145)
Total Bilirubin: 0.2 mg/dL (ref 0.2–1.2)
Total Protein: 6.6 g/dL (ref 6.0–8.3)

## 2020-07-30 MED ORDER — PANTOPRAZOLE SODIUM 40 MG PO TBEC: 40.0000 mg | DELAYED_RELEASE_TABLET | Freq: Every day | ORAL | 0 refills | Status: DC

## 2020-07-30 MED ORDER — AMBULATORY NON FORMULARY MEDICATION: 1 refills | Status: DC

## 2020-07-30 NOTE — Patient Instructions (Addendum)
If you are age 42 or younger, your body mass index should be between 19-25. Your Body mass index is 34.02 kg/m. If this is out of the aformentioned range listed, please consider follow up with your Primary Care Provider.   We have sent a prescription for Diltiazem 2%/Lidocaine 2% gel to [facility] for you. Using your index finger, you should apply a small amount of medication inside the anal opening and to the external anal area twice daily x 4 weeks.  [facility]'s information is below: Address: [address], [place], [place] [address]  [phone]  *Please DO NOT go directly from our office to pick up this medication! Give the pharmacy 1 day to process the prescription as this is compounded and takes time to make.  LABS:  Lab work has been ordered for you today. Our lab is located in the basement. Press "B" on the elevator. The lab is located at the first door on the left as you exit the elevator.  HEALTHCARE LAWS AND MY CHART RESULTS: Due to recent changes in healthcare laws, you may see the results of your imaging and laboratory studies on MyChart before your provider has had a chance to review them.   We understand that in some cases there may be results that are confusing or concerning to you. Not all laboratory results come back in the same time frame and the provider may be waiting for multiple results in order to interpret others.  Please give [place] 48 hours in order for your provider to thoroughly review all the results before contacting the office for clarification of your results.   MEDICATION  We have sent the following medication to your pharmacy for you to pick up at your convenience: Pantoprazole 40 MG once a day.  OVER THE COUNTER MEDICATION Please purchase the following medications over the counter and take as directed:  Miralax. Dissolve one capful in 8 ounces of water and drink before bed.  You can do a sitz bath twice a day as needed. We have  scheduled you a follow up with Dr. Lavon Paganini on 10/04/20 at 2:10.  Please call our office if your symptoms worsen. It was great seeing you today! Thank you for entrusting me with your care and choosing [facility].  Arnaldo Natal, CRNP   How to Take a Sitz Bath A sitz bath is a warm water bath that may be used to care for your rectum, genital area, or the area between your rectum and genitals (perineum). In a sitz bath, the water only comes up to your hips and covers your buttocks. A sitz bath may be done in a bathtub or with a portable sitz bath that fits over the toilet. Your health care provider may recommend a sitz bath to help:  Relieve pain and discomfort after delivering a baby.  Relieve pain and itching from hemorrhoids or anal fissures.  Relieve pain after certain surgeries.  Relax muscles that are sore or tight. How to take a sitz bath Take 3-4 sitz baths a day, or as many as told by your health care provider. Bathtub sitz bath To take a sitz bath in a bathtub: 1. Partially fill a bathtub with warm water. The water should be deep enough to cover your hips and buttocks when you are sitting in the tub. 2. Follow your health care provider's instructions if you are told to put medicine in the water. 3. Sit in the water. Open the tub  drain a little, and leave it open during your bath. 4. Turn on the warm water again, enough to replace the water that is draining out. Keep the water running throughout your bath. This helps keep the water at the right level and temperature. 5. Soak in the water for 15-20 minutes, or as long as told by your health care provider. 6. When you are done, be careful when you stand up. You may feel dizzy. 7. After the sitz bath, pat yourself dry. Do not rub your skin to dry it.   Over-the-toilet sitz bath To take a sitz bath with an over-the-toilet basin: 1. Follow the manufacturer's instructions. 2. Fill the basin with warm  water. 3. Follow your health care provider's instructions if you were told to put medicine in the water. 4. Sit on the seat. Make sure the water covers your buttocks and perineum. 5. Soak in the water for 15-20 minutes, or as long as told by your health care provider. 6. After the sitz bath, pat yourself dry. Do not rub your skin to dry it. 7. Clean and dry the basin between uses. 8. Discard the basin if it cracks, or according to the manufacturer's instructions.   Contact a health care provider if:  Your pain or itching gets worse. Do not continue with sitz baths if your symptoms get worse.  You have new symptoms. Do not continue with sitz baths until you talk with your health care provider. Summary  A sitz bath is a warm water bath in which the water only comes up to your hips and covers your buttocks.  A sitz bath may help relieve pain and discomfort after delivering a baby. It also may help with pain and itching from hemorrhoids or anal fissures, or pain after certain surgeries. It can also help to relax muscles that are sore or tight.  Take 3-4 sitz baths a day, or as many as told by your health care provider. Soak in the water for 15-20 minutes.  Do not continue with sitz baths if your symptoms get worse. This information is not intended to replace advice given to you by your health care provider. Make sure you discuss any questions you have with your health care provider. Document Revised: 02/09/2020 Document Reviewed: 02/09/2020 Elsevier Patient Education  2021 [employer].

## 2020-08-06 NOTE — Progress Notes (Signed)
Reviewed and agree with documentation and assessment and plan. K. Veena Nandigam , MD   

## 2020-08-20 ENCOUNTER — Ambulatory Visit: Payer: BC Managed Care – PPO | Admitting: Cardiology

## 2020-08-23 ENCOUNTER — Other Ambulatory Visit: Payer: Self-pay

## 2020-08-23 ENCOUNTER — Ambulatory Visit: Payer: BC Managed Care – PPO | Admitting: Cardiology

## 2020-08-23 ENCOUNTER — Encounter: Payer: Self-pay | Admitting: Cardiology

## 2020-08-23 VITALS — BP 120/77 | HR 64 | Temp 98.6°F | Resp 17 | Ht 60.0 in | Wt 175.8 lb

## 2020-08-23 DIAGNOSIS — I5022 Chronic systolic (congestive) heart failure: Secondary | ICD-10-CM

## 2020-08-23 MED ORDER — FUROSEMIDE 20 MG PO TABS: 20.0000 mg | ORAL_TABLET | ORAL | 3 refills | Status: DC | PRN

## 2020-08-23 NOTE — Progress Notes (Signed)
Patient is here for follow up visit.  Subjective:   Patient ID: Clent Jacks, female    DOB: 1981/05/31, 40 y.o.   MRN: [id number]   Chief Complaint  Patient presents with  . Follow-up    6 month  . HFrEF    HPI   40 year old Caucasian female with nonischemic dilated cardiomyopathy,HFrEF  Patient is doing well. She denies chest pain, shortness of breath, palpitations, leg edema, orthopnea, PND, TIA/syncope.   Current Outpatient Medications on File Prior to Visit  Medication Sig Dispense Refill  . AMBULATORY NON FORMULARY MEDICATION Medication Name: Diltiazem 2%/Lidocaine 2%   Using your index finger apply a small amount of medication inside the anal opening and to the external anal area twice daily x 6 weeks. 30 g 1  . CORLANOR 5 MG TABS tablet Take 1 tablet (5 mg total) by mouth 2 (two) times daily. 180 tablet 3  . ENTRESTO 97-103 MG TAKE 1 TABLET BY MOUTH  TWICE DAILY 180 tablet 0  . FARXIGA 10 MG TABS tablet TAKE 1 TABLET BY MOUTH ONCE DAILY BEFORE BREAKFAST 30 tablet 0  . magnesium oxide (MAG-OX) 400 MG tablet Take 1 tablet by mouth twice daily 60 tablet 0  . metoprolol succinate (TOPROL-XL) 50 MG 24 hr tablet Take 1 tablet (50 mg total) by mouth daily. 90 tablet 3  . ondansetron (ZOFRAN ODT) 4 MG disintegrating tablet 60m ODT q4 hours prn nausea/vomit 4 tablet 0  . pantoprazole (PROTONIX) 40 MG tablet Take 1 tablet (40 mg total) by mouth daily. 30 tablet 0  . spironolactone (ALDACTONE) 25 MG tablet Take 1 tablet (25 mg total) by mouth daily. 90 tablet 3  . TRI-LO-MILI 0.18/0.215/0.25 MG-25 MCG tab Take 1 tablet by mouth daily.     No current facility-administered medications on file prior to visit.    Cardiovascular studies:   EKG 08/23/2020: Sinus rhythm 64 bpm  Cannot exclude old anteroseptal infarct  Echocardiogram 12/01/2019:  Normal LV systolic function with visual EF 50-55%. Left ventricle cavity  is normal in size. Normal global wall motion. Normal  diastolic filling  pattern, normal LAP. Calculated EF 54%.  Mild (Grade I) aortic regurgitation.  Mild (Grade I) mitral regurgitation.  Mild tricuspid regurgitation.  Mild pulmonic regurgitation.  Compared to prior echo 09/25/2019, LVEF has improved from 15-20% to  50-55%.   RHC/LHC 09/26/2019: Normal coronaries Severe global hypokinesis, LVEF <15%  Cardiac MRI 07/23/2018: 1.  Mild left ventricular enlargement with borderline LVEF 50%. 2. There is no late gadolinium enhancement in the left ventricular myocardium. 3.  Normal right ventricular chamber size and function, RVEF 56%.  Recent Labs:  12/19/2019: Glucose 96, BUN/Cr 8/0.66. EGFR normal. Na/K 141/3.5.  NT-pro BNP 60  10/17/2019: Glucose 95, BUN/Cr 8/0.69. EGFR normal. Na/K 141/3.9.  NT-pro BNP 482  10/07/2019: Glucose 102, BUN/Cr 9/0.78. EGFR 108. Na/K 143/4.1. Chloride 107. Rest of the CMP normal H/H 12.2/36. MCV 91.7. Platelets 269. HbA1C 5.5% Chol 147, TG 130, HDL 61, LDL 60. TSH 1.483 normal MG 1.8  NT ProBNP 2,082  09/23/2019: BNP 101.2  02/07/2019: Glucose 88, BUN/Cr 10/0.77. EGFR >90. Na/K 137/4.0.  H/H 14/40. MCV 88. Platelets 269  Review of Systems  Cardiovascular: Negative for chest pain, dyspnea on exertion, leg swelling, palpitations and syncope.       Objective:    Vitals:   08/23/20 1450  BP: 120/77  Pulse: 64  Resp: 17  Temp: 98.6 F (37 C)  SpO2: 99%  Physical Exam Vitals and nursing note reviewed.  Constitutional:      General: She is not in acute distress. Neck:     Vascular: No JVD.  Cardiovascular:     Rate and Rhythm: Normal rate and regular rhythm.     Pulses: Intact distal pulses.     Heart sounds: Normal heart sounds. No murmur heard.   Pulmonary:     Effort: Pulmonary effort is normal.     Breath sounds: Normal breath sounds. No wheezing or rales.       Assessment & Recommendations:   40 year old Caucasian female with nonischemic dilated cardiomyopathy, now  with recovered LVEF  Chronic systolic heart failure [facility]): Nonischemic cardiomyopathy, NYHA class II.  Currently compensated.  She had relapse of HFrEF in 09/2018 (EF down to 15-20%), after near complete recovery on GDMT (Cardiac MRI EF 50% in 07/2018) LVEF recovered to normal (12/2019) Currently on Entresto to 97-103 mg bid, spironolactone to 25 mg daily, metoprolol succinate 50 mg daily, corlanor 5 mg bid, Farxiga 10 mg daily.  Given stability of EF and symptoms, will de-escalate with stopping [place]. Repeat echocardiogram in 6 months to ensure stability. Re-emphasized importance of contraception.  F/u in 6 months   Rama Mcclintock Esther Hardy, MD [facility] Pager: [phone] Office: [phone]

## 2020-08-24 ENCOUNTER — Encounter: Payer: Self-pay | Admitting: Cardiology

## 2020-09-02 ENCOUNTER — Other Ambulatory Visit: Payer: Self-pay | Admitting: Cardiology

## 2020-09-19 ENCOUNTER — Other Ambulatory Visit: Payer: Self-pay | Admitting: Gastroenterology

## 2020-09-19 DIAGNOSIS — K219 Gastro-esophageal reflux disease without esophagitis: Secondary | ICD-10-CM

## 2020-10-04 ENCOUNTER — Encounter: Payer: Self-pay | Admitting: Gastroenterology

## 2020-10-04 ENCOUNTER — Ambulatory Visit (INDEPENDENT_AMBULATORY_CARE_PROVIDER_SITE_OTHER): Payer: BC Managed Care – PPO | Admitting: Gastroenterology

## 2020-10-04 ENCOUNTER — Other Ambulatory Visit: Payer: BC Managed Care – PPO

## 2020-10-04 VITALS — BP 122/80 | HR 64 | Ht 60.0 in | Wt 177.4 lb

## 2020-10-04 DIAGNOSIS — K602 Anal fissure, unspecified: Secondary | ICD-10-CM

## 2020-10-04 DIAGNOSIS — R197 Diarrhea, unspecified: Secondary | ICD-10-CM | POA: Diagnosis not present

## 2020-10-04 DIAGNOSIS — R152 Fecal urgency: Secondary | ICD-10-CM

## 2020-10-04 NOTE — Patient Instructions (Signed)
Your provider has requested that you go to the basement level for lab work before leaving today. Press "B" on the elevator. The lab is located at the first door on the left as you exit the elevator.  We have given you samples of Creon to take 2 with meals and 1 with snacks , if they work for you we will send you in a new prescription   Follow up in 3 months  Due to recent changes in healthcare laws, you may see the results of your imaging and laboratory studies on MyChart before your provider has had a chance to review them.  We understand that in some cases there may be results that are confusing or concerning to you. Not all laboratory results come back in the same time frame and the provider may be waiting for multiple results in order to interpret others.  Please give [place] 48 hours in order for your provider to thoroughly review all the results before contacting the office for clarification of your results.   I appreciate the  opportunity to care for you  Thank You   Marsa Aris , MD

## 2020-10-04 NOTE — Progress Notes (Signed)
Tina Colon    [id number]    [birth year]  Primary Care Physician:Sagardia, Eilleen Kempf, MD  Referring Physician: Georgina Quint, MD [address] [place],  [place] [address]   Chief complaint: Rectal pain, nausea  HPI:  40 year old female with a past medical history of nonischemic dilated cardiomyopathy, CHF, EtOH abuse, pancreatitis, GERD, RUQ pain with N/V.   She continue to have fecal urgency and intermittent rectal pain. She is also having nausea but no vomiting. She has been abstaining from ETOH  RUQ sonogram 11/17/2018: Gallbladder:Questionable minimal layering sludge in the nondistended gallbladder. No convincing gallstones. No gallbladder wall thickening. No pericholecystic fluid. No sonographic Murphy sign. Common bile duct: Diameter: 4 mm Liver:No focal lesion identified. Within normal limits in parenchymal echogenicity. Portal vein is patent on color Doppler imaging with normal direction of blood flow towards the liver. IMPRESSION: 1. Questionable minimal layering sludge in the gallbladder, with no evidence of cholelithiasis or acute cholecystitis. 2. No biliary ductal dilatation. 3. Normal liver.  EGD 06/19/2015: Normal appearing esophagus and GE junction, the stomach was well visualized and normal in appearance, normal appearing duodenum  Echocardiogram 12/01/2019:  Normal LV systolic function with visual EF 50-55%. Left ventricle cavity  is normal in size. Normal global wall motion. Normal diastolic filling  pattern, normal LAP. Calculated EF 54%.  Mild (Grade I) aortic regurgitation.  Mild (Grade I) mitral regurgitation.  Mild tricuspid regurgitation.  Mild pulmonic regurgitation.  Compared to prior echo 09/25/2019, LVEF has improved from 15-20% to 50-55%.    Outpatient Encounter Medications as of 10/04/2020  Medication Sig  . AMBULATORY NON FORMULARY MEDICATION Medication Name: Diltiazem 2%/Lidocaine 2%   Using your index  finger apply a small amount of medication inside the anal opening and to the external anal area twice daily x 6 weeks.  Retia Passe 5 MG TABS tablet Take 1 tablet (5 mg total) by mouth 2 (two) times daily.  Marland Kitchen ENTRESTO 97-103 MG TAKE 1 TABLET BY MOUTH  TWICE DAILY  . furosemide (LASIX) 20 MG tablet Take 1 tablet (20 mg total) by mouth as needed for edema.  . magnesium oxide (MAG-OX) 400 MG tablet Take 1 tablet by mouth twice daily  . metoprolol succinate (TOPROL-XL) 50 MG 24 hr tablet Take 1 tablet (50 mg total) by mouth daily.  . ondansetron (ZOFRAN ODT) 4 MG disintegrating tablet 4mg  ODT q4 hours prn nausea/vomit  . pantoprazole (PROTONIX) 40 MG tablet TAKE 1 TABLET BY MOUTH ONCE DAILY  NEED OFFICE VISIT   . spironolactone (ALDACTONE) 25 MG tablet Take 1 tablet (25 mg total) by mouth daily.  . TRI-LO-MILI 0.18/0.215/0.25 MG-25 MCG tab Take 1 tablet by mouth daily.   No facility-administered encounter medications on file as of 10/04/2020.    Allergies as of 10/04/2020 - Review Complete 10/04/2020  Allergen Reaction Noted  . Penicillins Anaphylaxis 03/13/2013    Past Medical History:  Diagnosis Date  . Cardiomyopathy (HCC)   . CHF (congestive heart failure) (HCC)   . ETOH abuse   . Gastritis   . GERD (gastroesophageal reflux disease)   . Heart failure (HCC)   . Pancreatitis     Past Surgical History:  Procedure Laterality Date  . ESOPHAGOGASTRODUODENOSCOPY  06/19/2015    Dr 08/17/2015  . RIGHT/LEFT HEART CATH AND CORONARY ANGIOGRAPHY N/A 09/26/2019   Procedure: RIGHT/LEFT HEART CATH AND CORONARY ANGIOGRAPHY;  Surgeon: 09/28/2019, MD;  Location: [facility];  Service: Cardiovascular;  Laterality: N/A;  . WISDOM TOOTH EXTRACTION      Family History  Problem Relation Age of Onset  . Diabetes Mother   . Hyperlipidemia Mother   . Hypertension Father   . Gout Father   . Stomach cancer Maternal Grandfather        York Spaniel that his siblings died with cancer also but not  sure what kind  . Diabetes Other   . Heart disease Other   . Irritable bowel syndrome Other   . Gallstones Sister        Had her gallbladder removed   . Gallstones Paternal Aunt        said a few on her dad's side     Social History   Socioeconomic History  . Marital status: Married    Spouse name: Not on file  . Number of children: 0  . Years of education: Not on file  . Highest education level: Not on file  Occupational History  . Occupation: retail  Tobacco Use  . Smoking status: Former Smoker    Packs/day: 0.50    Years: 10.00    Pack years: 5.00    Types: Cigarettes    Quit date: 2011    Years since quitting: 11.3  . Smokeless tobacco: Never Used  Vaping Use  . Vaping Use: Never used  Substance and Sexual Activity  . Alcohol use: Not Currently    Comment: occ  . Drug use: Yes    Types: Marijuana    Comment: last smoked 09/20/2019  . Sexual activity: Not Currently  Other Topics Concern  . Not on file  Social History Narrative  . Not on file   Social Determinants of Health   Financial Resource Strain: Not on file  Food Insecurity: Not on file  Transportation Needs: Not on file  Physical Activity: Not on file  Stress: Not on file  Social Connections: Not on file  Intimate Partner Violence: Not on file      Review of systems: All other review of systems negative except as mentioned in the HPI.   Physical Exam: Vitals:   10/04/20 1418  BP: 122/80  Pulse: 64   Body mass index is 34.65 kg/m. Gen:      No acute distress HEENT:  sclera anicteric Abd:      soft, non-tender; no palpable masses, no distension Ext:    No edema Neuro: alert and oriented x 3 Psych: normal mood and affect  Data Reviewed:  Reviewed labs, radiology imaging, old records and pertinent past GI work up   Assessment and Plan/Recommendations: 40 year old very pleasant female with history of EtOH use with fecal urgency, rectal pain and nausea She is abstaining from  EtOH We will do a trial of Creon for pancreatic insufficiency, use Creon 72,000 units before meals and 78588 units snacks Check fecal elastase and fecal fat  If continues to have persistent diarrhea, will consider colonoscopy for further evaluation and exclude microscopic colitis  Anal fissure has healed  Return in 3 months or sooner if needed  This visit required 30 minutes of patient care (this includes precharting, chart review, review of results, face-to-face time used for counseling as well as treatment plan and follow-up. The patient was provided an opportunity to ask questions and all were answered. The patient agreed with the plan and demonstrated an understanding of the instructions.  Iona Beard , MD    CC: Georgina Quint, [place]

## 2020-10-08 ENCOUNTER — Other Ambulatory Visit: Payer: BC Managed Care – PPO

## 2020-10-08 DIAGNOSIS — R197 Diarrhea, unspecified: Secondary | ICD-10-CM

## 2020-10-08 DIAGNOSIS — K602 Anal fissure, unspecified: Secondary | ICD-10-CM

## 2020-10-08 DIAGNOSIS — R152 Fecal urgency: Secondary | ICD-10-CM

## 2020-10-12 ENCOUNTER — Telehealth: Payer: Self-pay | Admitting: Gastroenterology

## 2020-10-12 NOTE — Telephone Encounter (Signed)
Patient called and said the medication is not working seeking advise.

## 2020-10-12 NOTE — Telephone Encounter (Signed)
Left message on machine to call back need to know what medication the pt is referring to.

## 2020-10-16 LAB — FECAL FAT, QUALITATIVE: FECAL FAT, QUALITATIVE: NORMAL

## 2020-10-16 LAB — PANCREATIC ELASTASE, FECAL: Pancreatic Elastase-1, Stool: >500 mcg/g

## 2020-10-18 NOTE — Telephone Encounter (Signed)
Left a message on the machine to call back if she still needs assistance.

## 2020-10-21 ENCOUNTER — Other Ambulatory Visit: Payer: Self-pay | Admitting: Gastroenterology

## 2020-10-21 DIAGNOSIS — K219 Gastro-esophageal reflux disease without esophagitis: Secondary | ICD-10-CM

## 2020-10-23 ENCOUNTER — Telehealth: Payer: Self-pay | Admitting: Gastroenterology

## 2020-11-21 ENCOUNTER — Other Ambulatory Visit: Payer: Self-pay | Admitting: Gastroenterology

## 2020-11-21 ENCOUNTER — Other Ambulatory Visit: Payer: Self-pay | Admitting: Cardiology

## 2020-11-21 DIAGNOSIS — K219 Gastro-esophageal reflux disease without esophagitis: Secondary | ICD-10-CM

## 2020-11-21 DIAGNOSIS — I5022 Chronic systolic (congestive) heart failure: Secondary | ICD-10-CM

## 2020-11-28 ENCOUNTER — Other Ambulatory Visit: Payer: Self-pay

## 2020-11-28 DIAGNOSIS — I5022 Chronic systolic (congestive) heart failure: Secondary | ICD-10-CM

## 2020-11-28 MED ORDER — MAGNESIUM OXIDE 400 MG PO TABS: 1.0000 | ORAL_TABLET | Freq: Two times a day (BID) | ORAL | 0 refills | Status: DC

## 2020-11-28 MED ORDER — IVABRADINE HCL 5 MG PO TABS: 5.0000 mg | ORAL_TABLET | Freq: Two times a day (BID) | ORAL | 0 refills | Status: DC

## 2020-12-07 ENCOUNTER — Other Ambulatory Visit: Payer: Self-pay | Admitting: Cardiology

## 2020-12-07 DIAGNOSIS — I5022 Chronic systolic (congestive) heart failure: Secondary | ICD-10-CM

## 2020-12-31 ENCOUNTER — Encounter (HOSPITAL_BASED_OUTPATIENT_CLINIC_OR_DEPARTMENT_OTHER): Payer: Self-pay | Admitting: Urology

## 2020-12-31 ENCOUNTER — Other Ambulatory Visit: Payer: Self-pay

## 2020-12-31 ENCOUNTER — Emergency Department (HOSPITAL_BASED_OUTPATIENT_CLINIC_OR_DEPARTMENT_OTHER)
Admission: EM | Admit: 2020-12-31 | Discharge: 2020-12-31 | Disposition: A | Discharge status: Home / Self Care | Payer: BC Managed Care – PPO | Attending: Emergency Medicine | Admitting: Emergency Medicine

## 2020-12-31 DIAGNOSIS — Z79899 Other long term (current) drug therapy: Secondary | ICD-10-CM | POA: Diagnosis not present

## 2020-12-31 DIAGNOSIS — I5022 Chronic systolic (congestive) heart failure: Secondary | ICD-10-CM | POA: Diagnosis not present

## 2020-12-31 DIAGNOSIS — Z87891 Personal history of nicotine dependence: Secondary | ICD-10-CM | POA: Diagnosis not present

## 2020-12-31 DIAGNOSIS — Z955 Presence of coronary angioplasty implant and graft: Secondary | ICD-10-CM | POA: Diagnosis not present

## 2020-12-31 DIAGNOSIS — R222 Localized swelling, mass and lump, trunk: Secondary | ICD-10-CM | POA: Diagnosis present

## 2020-12-31 DIAGNOSIS — L02213 Cutaneous abscess of chest wall: Secondary | ICD-10-CM | POA: Insufficient documentation

## 2020-12-31 DIAGNOSIS — L0291 Cutaneous abscess, unspecified: Secondary | ICD-10-CM

## 2020-12-31 MED ORDER — DOXYCYCLINE HYCLATE 100 MG PO CAPS: 100.0000 mg | ORAL_CAPSULE | Freq: Two times a day (BID) | ORAL | 0 refills | Status: AC

## 2020-12-31 MED ORDER — LIDOCAINE HCL (PF) 1 % IJ SOLN
30.0000 mL | Freq: Once | INTRAMUSCULAR | Status: AC
  Administered 2020-12-31: 30 mL via INTRADERMAL
  Filled 2020-12-31: qty 30

## 2020-12-31 MED ORDER — LIDOCAINE-EPINEPHRINE-TETRACAINE (LET) TOPICAL GEL
3.0000 mL | Freq: Once | TOPICAL | Status: AC
  Administered 2020-12-31: 3 mL via TOPICAL
  Filled 2020-12-31: qty 3

## 2020-12-31 MED ORDER — ACETAMINOPHEN 325 MG PO TABS
650.0000 mg | ORAL_TABLET | Freq: Once | ORAL | Status: AC
  Administered 2020-12-31: 650 mg via ORAL
  Filled 2020-12-31: qty 2

## 2020-12-31 NOTE — ED Notes (Signed)
Unable to update patient's vital signs until provider finishes with I and D.

## 2020-12-31 NOTE — ED Notes (Signed)
ED Provider at bedside for I and D

## 2020-12-31 NOTE — ED Provider Notes (Signed)
[facility] EMERGENCY DEPARTMENT Provider Note   CSN: [id number] Arrival date & time: 12/31/20  1109     History Chief Complaint  Patient presents with   Abscess    Tina Colon is a 40 y.o. female.  HPI   40 y/o female with a h/o cardiomyopathy, CHF, ETOH abuse, gastritis, GERD, heart failure, pancreatitis, who presents to the ED Today for eval of abscess. Reports pain, swelling and redness to the right chest for the last 2 days. Denies drainage from the wound. Denies fevers.    Past Medical History:  Diagnosis Date   Cardiomyopathy (HCC)    CHF (congestive heart failure) (HCC)    ETOH abuse    Gastritis    GERD (gastroesophageal reflux disease)    Heart failure (HCC)    Pancreatitis     Patient Active Problem List   Diagnosis Date Noted   Obesity (BMI 30-39.9) 09/25/2019   Epigastric pain    Hx of cardiomyopathy    Family history of premature coronary artery disease    Prolonged Q-T interval on ECG    Class 1 obesity due to excess calories with serious comorbidity and body mass index (BMI) of 33.0 to 33.9 in adult    Former smoker    Marijuana use    Alcohol use    Nausea and vomiting    History of pancreatitis    NSTEMI (non-ST elevated myocardial infarction) (HCC) 09/23/2019   Chronic systolic heart failure (HCC) 08/12/2018   Bilateral pleural effusion 12/23/2017   Tachycardia 12/23/2017   Hypokalemia 12/23/2017   CHF (congestive heart failure) (HCC) 12/23/2017   Routine general medical examination at a health care facility 10/02/2017   Gastritis 06/07/2015   Alcohol abuse 06/07/2015   Cannabis abuse 06/07/2015    Past Surgical History:  Procedure Laterality Date   ESOPHAGOGASTRODUODENOSCOPY  06/19/2015    Dr Lavon Paganini   RIGHT/LEFT HEART CATH AND CORONARY ANGIOGRAPHY N/A 09/26/2019   Procedure: RIGHT/LEFT HEART CATH AND CORONARY ANGIOGRAPHY;  Surgeon: Elder Negus, MD;  Location: [facility]VAS[facility] CV LAB;  Service: Cardiovascular;   Laterality: N/A;   WISDOM TOOTH EXTRACTION       OB History   No obstetric history on file.     Family History  Problem Relation Age of Onset   Diabetes Mother    Hyperlipidemia Mother    Hypertension Father    Gout Father    Stomach cancer Maternal Emelia Loron        [place] Spaniel that his siblings died with cancer also but not sure what kind   Diabetes Other    Heart disease Other    Irritable bowel syndrome Other    Gallstones Sister        Had her gallbladder removed    Gallstones Paternal Aunt        said a few on her dad's side     Social History   Tobacco Use   Smoking status: Former    Packs/day: 0.50    Years: 10.00    Pack years: 5.00    Types: Cigarettes    Quit date: 2011    Years since quitting: 11.5   Smokeless tobacco: Never  Vaping Use   Vaping Use: Never used  Substance Use Topics   Alcohol use: Not Currently    Comment: occ   Drug use: Yes    Types: Marijuana    Comment: last smoked 09/20/2019    Home Medications Prior to Admission medications   Medication  Sig Start Date End Date Taking? Authorizing Provider  doxycycline (VIBRAMYCIN) 100 MG capsule Take 1 capsule (100 mg total) by mouth 2 (two) times daily for 7 days. 12/31/20 01/07/21 Yes Gussie Murton S, PA-C  AMBULATORY NON FORMULARY MEDICATION Medication Name: Diltiazem 2%/Lidocaine 2%   Using your index finger apply a small amount of medication inside the anal opening and to the external anal area twice daily x 6 weeks. 07/30/20   Arnaldo Natal, NP  ENTRESTO 97-103 MG Take 1 tablet by mouth twice daily 12/07/20   Patwardhan, Anabel Bene, MD  furosemide (LASIX) 20 MG tablet Take 1 tablet (20 mg total) by mouth as needed for edema. 08/23/20 11/21/20  Patwardhan, Anabel Bene, MD  ivabradine (CORLANOR) 5 MG TABS tablet Take 1 tablet (5 mg total) by mouth 2 (two) times daily. 11/28/20   Patwardhan, Anabel Bene, MD  magnesium oxide (MAG-OX) 400 MG tablet Take 1 tablet (400 mg total) by mouth 2 (two) times  daily. 11/28/20   Patwardhan, Anabel Bene, MD  metoprolol succinate (TOPROL-XL) 50 MG 24 hr tablet Take 1 tablet (50 mg total) by mouth daily. 12/28/19   Patwardhan, Anabel Bene, MD  ondansetron (ZOFRAN ODT) 4 MG disintegrating tablet 4mg  ODT q4 hours prn nausea/vomit 09/21/19   Pisciotta, 09/23/19, PA-C  pantoprazole (PROTONIX) 40 MG tablet Take 1 tablet (40 mg total) by mouth daily. 11/21/20   11/23/20, MD  spironolactone (ALDACTONE) 25 MG tablet Take 1 tablet (25 mg total) by mouth daily. 12/28/19   Patwardhan, Manish J, MD  TRI-LO-MILI 0.18/0.215/0.25 MG-25 MCG tab Take 1 tablet by mouth daily. 10/16/19   [provider]    Allergies    Penicillins  Review of Systems   Review of Systems  Constitutional:  Negative for fever.  Skin:  Positive for color change and wound.       abscess   Physical Exam Updated Vital Signs BP 113/73 (BP Location: Right Arm)   Pulse 78   Temp 98.8 F (37.1 C) (Oral)   Resp 19   Ht 5' (1.524 m)   Wt 79.4 kg   SpO2 99%   BMI 34.18 kg/m   Physical Exam Vitals and nursing note reviewed.  Constitutional:      General: She is not in acute distress.    Appearance: She is well-developed.  HENT:     Head: Normocephalic and atraumatic.  Eyes:     Conjunctiva/sclera: Conjunctivae normal.  Cardiovascular:     Rate and Rhythm: Normal rate.  Pulmonary:     Effort: Pulmonary effort is normal.  Musculoskeletal:        General: Normal range of motion.     Cervical back: Neck supple.  Skin:    General: Skin is warm and dry.     Comments: 1cm fluctuant area to the right chest wall that is TTP with some surrounding erythema that extends about 3-4cm out.   Neurological:     Mental Status: She is alert.    ED Results / Procedures / Treatments   Labs (all labs ordered are listed, but only abnormal results are displayed) Labs Reviewed - No data to display  EKG None  Radiology No results found.  Procedures .7/9/21Incision and  Drainage  Date/Time: 12/31/2020 3:47 PM Performed by: 01/02/2021, PA-C Authorized by: Karrie Meres, PA-C   Consent:    Consent obtained:  Verbal   Consent given by:  Patient   Risks, benefits, and alternatives were discussed: yes  Risks discussed:  Bleeding, incomplete drainage and pain   Alternatives discussed:  No treatment Universal protocol:    Procedure explained and questions answered to patient or proxy's satisfaction: yes     Immediately prior to procedure, a time out was called: yes     Patient identity confirmed:  Verbally with patient Location:    Type:  Abscess   Size:  1   Location:  Trunk   Trunk location:  Chest Pre-procedure details:    Skin preparation:  Povidone-iodine Sedation:    Sedation type:  None Anesthesia:    Anesthesia method:  Topical application and local infiltration   Topical anesthetic:  LET   Local anesthetic:  Lidocaine 1% w/o epi Procedure type:    Complexity:  Simple Procedure details:    Ultrasound guidance: no     Needle aspiration: no     Incision types:  Stab incision   Incision depth:  Dermal   Drainage:  Purulent   Drainage amount:  Moderate   Wound treatment:  Wound left open   Packing materials:  None Post-procedure details:    Procedure completion:  Tolerated   Medications Ordered in ED Medications  acetaminophen (TYLENOL) tablet 650 mg (650 mg Oral Given 12/31/20 1455)  lidocaine-EPINEPHrine-tetracaine (LET) topical gel (3 mLs Topical Given 12/31/20 1455)  lidocaine (PF) (XYLOCAINE) 1 % injection 30 mL (30 mLs Intradermal Given 12/31/20 1456)    ED Course  I have reviewed the triage vital signs and the nursing notes.  Pertinent labs & imaging results that were available during my care of the patient were reviewed by me and considered in my medical decision making (see chart for details).    MDM Rules/Calculators/A&P                          Patient with skin abscess amenable to incision and drainage.   Abscess was not large enough to warrant packing or drain. Encouraged home warm soaks and flushing.  Mild signs of cellulitis is surrounding skin.  Will d/c to home with doxycycline. Advised on f/u and return precautions. All questions answered, pt stable for discharge.    Final Clinical Impression(s) / ED Diagnoses Final diagnoses:  Abscess    Rx / DC Orders ED Discharge Orders          Ordered    doxycycline (VIBRAMYCIN) 100 MG capsule  2 times daily        12/31/20 1546             Karrie Meres, PA-C 12/31/20 1548    Tegeler, Canary Brim, MD 01/01/21 [phone]

## 2020-12-31 NOTE — Discharge Instructions (Signed)
You were given a prescription for antibiotics. Please take the antibiotic prescription fully.   Please follow up with your primary care provider within 5-7 days for re-evaluation of your symptoms. If you do not have a primary care provider, information for a healthcare clinic has been provided for you to make arrangements for follow up care.  Please return to the emergency room immediately if you experience any new or worsening symptoms or any symptoms that indicate worsening infection such as fevers, increased redness/swelling/pain, warmth, or drainage from the affected area.

## 2021-01-25 ENCOUNTER — Other Ambulatory Visit: Payer: Self-pay | Admitting: Gastroenterology

## 2021-01-25 ENCOUNTER — Other Ambulatory Visit: Payer: Self-pay | Admitting: Cardiology

## 2021-01-25 DIAGNOSIS — I5022 Chronic systolic (congestive) heart failure: Secondary | ICD-10-CM

## 2021-01-25 DIAGNOSIS — K219 Gastro-esophageal reflux disease without esophagitis: Secondary | ICD-10-CM

## 2021-02-06 ENCOUNTER — Other Ambulatory Visit: Payer: Self-pay | Admitting: Cardiology

## 2021-02-06 DIAGNOSIS — I5022 Chronic systolic (congestive) heart failure: Secondary | ICD-10-CM

## 2021-02-25 ENCOUNTER — Ambulatory Visit: Payer: BC Managed Care – PPO

## 2021-02-25 ENCOUNTER — Other Ambulatory Visit: Payer: Self-pay

## 2021-02-25 DIAGNOSIS — I5022 Chronic systolic (congestive) heart failure: Secondary | ICD-10-CM | POA: Diagnosis not present

## 2021-02-28 ENCOUNTER — Other Ambulatory Visit: Payer: Self-pay | Admitting: Gastroenterology

## 2021-02-28 ENCOUNTER — Other Ambulatory Visit: Payer: Self-pay | Admitting: Cardiology

## 2021-02-28 DIAGNOSIS — I5022 Chronic systolic (congestive) heart failure: Secondary | ICD-10-CM

## 2021-02-28 DIAGNOSIS — K219 Gastro-esophageal reflux disease without esophagitis: Secondary | ICD-10-CM

## 2021-03-06 ENCOUNTER — Other Ambulatory Visit: Payer: Self-pay

## 2021-03-06 ENCOUNTER — Ambulatory Visit: Payer: BC Managed Care – PPO | Admitting: Cardiology

## 2021-03-06 ENCOUNTER — Encounter: Payer: Self-pay | Admitting: Cardiology

## 2021-03-06 VITALS — BP 123/84 | HR 85 | Temp 98.2°F | Ht 60.0 in | Wt 173.0 lb

## 2021-03-06 DIAGNOSIS — I5022 Chronic systolic (congestive) heart failure: Secondary | ICD-10-CM

## 2021-03-06 NOTE — Progress Notes (Signed)
Patient is here for follow up visit.  Subjective:   Patient ID: Tina Colon, female    DOB: 1981/04/22, 40 y.o.   MRN: [id number]   Chief Complaint  Patient presents with   Chronic systolic heart failure    Follow-up    HPI   40 year old Caucasian female with nonischemic dilated cardiomyopathy,HFrEF  Patient is doing well. She denies chest pain, shortness of breath, palpitations, leg edema, orthopnea, PND, TIA/syncope. Reviewed recent echocardiogram results with the patient, details below.     Current Outpatient Medications on File Prior to Visit  Medication Sig Dispense Refill   AMBULATORY NON FORMULARY MEDICATION Medication Name: Diltiazem 2%/Lidocaine 2%   Using your index finger apply a small amount of medication inside the anal opening and to the external anal area twice daily x 6 weeks. 30 g 1   ENTRESTO 97-103 MG Take 1 tablet by mouth twice daily 60 tablet 0   furosemide (LASIX) 20 MG tablet Take 1 tablet (20 mg total) by mouth as needed for edema. 60 tablet 3   magnesium oxide (MAG-OX) 400 (240 Mg) MG tablet Take 1 tablet by mouth twice daily 60 tablet 0   metoprolol succinate (TOPROL-XL) 50 MG 24 hr tablet Take 1 tablet by mouth once daily 30 tablet 3   ondansetron (ZOFRAN ODT) 4 MG disintegrating tablet 53m ODT q4 hours prn nausea/vomit 4 tablet 0   spironolactone (ALDACTONE) 25 MG tablet Take 1 tablet by mouth once daily 90 tablet 0   TRI-LO-MILI 0.18/0.215/0.25 MG-25 MCG tab Take 1 tablet by mouth daily.     No current facility-administered medications on file prior to visit.    Cardiovascular studies:   EKG 03/06/2021: Sinus rhythm 66 bpm Possible old anteroseptal infarct  Echocardiogram 02/25/2021:  Left ventricle cavity is normal in size and wall thickness. Normal global  wall motion. Normal LV systolic function with EF 61%. Normal diastolic  filling pattern.  Mild (Grade I) mitral regurgitation.  Mild tricuspid regurgitation.  Mild pulmonic  regurgitation.  No evidence of pulmonary hypertension.  No significant change compared to previous study on 12/01/2019.  RHC/LHC 09/26/2019: Normal coronaries Severe global hypokinesis, LVEF <15%  Cardiac MRI 07/23/2018: 1.  Mild left ventricular enlargement with borderline LVEF 50%. 2. There is no late gadolinium enhancement in the left ventricular myocardium. 3.  Normal right ventricular chamber size and function, RVEF 56%.  Recent Labs:  12/19/2019: Glucose 96, BUN/Cr 8/0.66. EGFR normal. Na/K 141/3.5.  NT-pro BNP 60  10/17/2019: Glucose 95, BUN/Cr 8/0.69. EGFR normal. Na/K 141/3.9.  NT-pro BNP 482  10/07/2019: Glucose 102, BUN/Cr 9/0.78. EGFR 108. Na/K 143/4.1. Chloride 107. Rest of the CMP normal H/H 12.2/36. MCV 91.7. Platelets 269. HbA1C 5.5% Chol 147, TG 130, HDL 61, LDL 60. TSH 1.483 normal MG 1.8  NT ProBNP 2,082  09/23/2019: BNP 101.2  02/07/2019: Glucose 88, BUN/Cr 10/0.77. EGFR >90. Na/K 137/4.0.  H/H 14/40. MCV 88. Platelets 269  Review of Systems  Cardiovascular:  Negative for chest pain, dyspnea on exertion, leg swelling, palpitations and syncope.      Objective:    Vitals:   03/06/21 1241  BP: 123/84  Pulse: 85  Temp: 98.2 F (36.8 C)  SpO2: 98%     Physical Exam Vitals and nursing note reviewed.  Constitutional:      General: She is not in acute distress. Neck:     Vascular: No JVD.  Cardiovascular:     Rate and Rhythm: Normal rate and regular rhythm.  Pulses: Intact distal pulses.     Heart sounds: Normal heart sounds. No murmur heard. Pulmonary:     Effort: Pulmonary effort is normal.     Breath sounds: Normal breath sounds. No wheezing or rales.  Musculoskeletal:     Right lower leg: No edema.     Left lower leg: No edema.      Assessment & Recommendations:   40 year old Caucasian female with nonischemic dilated cardiomyopathy, now with recovered LVEF   Chronic systolic heart failure [facility]): Nonischemic cardiomyopathy, NYHA  class II.  Currently compensated.  She had relapse of HFrEF in 09/2018 (EF down to 15-20%), after near complete recovery on GDMT (Cardiac MRI EF 50% in 07/2018) LVEF recovered and stays normal (02/2021) Currently on Entresto to 97-103 mg bid, spironolactone to 25 mg daily, metoprolol succinate 50 mg daily Okay to stop corlanor 5 mg bid.   Avoid pregnancy  I will see her back in 3 months for clinical follow-up and repeat echocardiogram in 6 months.  If EF remains normal on Corlanor, I will see her once a year.     Nigel Mormon, MD [facility] [place] Pager: [phone] Office: [phone]

## 2021-03-13 ENCOUNTER — Other Ambulatory Visit: Payer: Self-pay | Admitting: Cardiology

## 2021-03-13 DIAGNOSIS — I5022 Chronic systolic (congestive) heart failure: Secondary | ICD-10-CM

## 2021-04-02 ENCOUNTER — Other Ambulatory Visit: Payer: Self-pay | Admitting: Gastroenterology

## 2021-04-02 ENCOUNTER — Other Ambulatory Visit: Payer: Self-pay | Admitting: Cardiology

## 2021-04-02 DIAGNOSIS — I5022 Chronic systolic (congestive) heart failure: Secondary | ICD-10-CM

## 2021-04-02 DIAGNOSIS — K219 Gastro-esophageal reflux disease without esophagitis: Secondary | ICD-10-CM

## 2021-05-01 ENCOUNTER — Other Ambulatory Visit: Payer: Self-pay | Admitting: Gastroenterology

## 2021-05-01 ENCOUNTER — Other Ambulatory Visit: Payer: Self-pay | Admitting: Cardiology

## 2021-05-01 DIAGNOSIS — K219 Gastro-esophageal reflux disease without esophagitis: Secondary | ICD-10-CM

## 2021-05-01 DIAGNOSIS — I5022 Chronic systolic (congestive) heart failure: Secondary | ICD-10-CM

## 2021-06-05 ENCOUNTER — Ambulatory Visit: Payer: BC Managed Care – PPO | Admitting: Cardiology

## 2021-06-05 ENCOUNTER — Other Ambulatory Visit: Payer: Self-pay

## 2021-06-05 ENCOUNTER — Encounter: Payer: Self-pay | Admitting: Cardiology

## 2021-06-05 ENCOUNTER — Inpatient Hospital Stay: Payer: BC Managed Care – PPO

## 2021-06-05 VITALS — BP 129/86 | HR 72 | Temp 97.8°F | Resp 16 | Ht 60.0 in | Wt 173.0 lb

## 2021-06-05 DIAGNOSIS — R002 Palpitations: Secondary | ICD-10-CM

## 2021-06-05 DIAGNOSIS — I428 Other cardiomyopathies: Secondary | ICD-10-CM | POA: Diagnosis not present

## 2021-06-05 DIAGNOSIS — I5022 Chronic systolic (congestive) heart failure: Secondary | ICD-10-CM | POA: Diagnosis not present

## 2021-06-05 NOTE — Progress Notes (Signed)
° ° °Patient is here for follow up visit. ° °Subjective:  ° °Patient ID: Tina Colon, female    DOB: 12/03/1980, 40 y.o.   MRN: [id number] ° ° °Chief Complaint  °Patient presents with  ° Congestive Heart Failure  ° Follow-up  °  3 month  ° ° °HPI  ° °40-year-old Caucasian female with nonischemic dilated cardiomyopathy,HFrEF ° °Patient is doing well. She denies chest pain, shortness of breath, leg edema, orthopnea, PND, TIA/syncope.  However, 2 days ago, she had an episode of palpitations that lasted for several hours, associated shortness of breath.  This episode is completely resolved. ° ° °Current Outpatient Medications on File Prior to Visit  °Medication Sig Dispense Refill  ° AMBULATORY NON FORMULARY MEDICATION Medication Name: Diltiazem 2%/Lidocaine 2% ° ° Using your index finger apply a small amount of medication inside the anal opening and to the external anal area twice daily x 6 weeks. 30 g 1  ° ENTRESTO 97-103 MG Take 1 tablet by mouth twice daily 60 tablet 0  ° furosemide (LASIX) 20 MG tablet Take 1 tablet (20 mg total) by mouth as needed for edema. 60 tablet 3  ° magnesium oxide (MAG-OX) 400 (240 Mg) MG tablet Take 1 tablet by mouth twice daily 60 tablet 0  ° metoprolol succinate (TOPROL-XL) 50 MG 24 hr tablet Take 1 tablet by mouth once daily 30 tablet 3  ° ondansetron (ZOFRAN ODT) 4 MG disintegrating tablet 4mg ODT q4 hours prn nausea/vomit 4 tablet 0  ° pantoprazole (PROTONIX) 40 MG tablet TAKE 1 TABLET BY MOUTH ONCE DAILY **NEED  OFFICE  VISIT** 30 tablet 0  ° spironolactone (ALDACTONE) 25 MG tablet Take 1 tablet by mouth once daily 90 tablet 0  ° TRI-LO-MILI 0.18/0.215/0.25 MG-25 MCG tab Take 1 tablet by mouth daily.    ° °No current facility-administered medications on file prior to visit.  ° ° °Cardiovascular studies:  ° °EKG 03/06/2021: °Sinus rhythm 66 bpm °Possible old anteroseptal infarct ° °Echocardiogram 02/25/2021:  °Left ventricle cavity is normal in size and wall thickness. Normal global   °wall motion. Normal LV systolic function with EF 61%. Normal diastolic  °filling pattern.  °Mild (Grade I) mitral regurgitation.  °Mild tricuspid regurgitation.  °Mild pulmonic regurgitation.  °No evidence of pulmonary hypertension.  °No significant change compared to previous study on 12/01/2019. ° °RHC/LHC 09/26/2019: °Normal coronaries °Severe global hypokinesis, LVEF <15% ° °Cardiac MRI 07/23/2018: °1.  Mild left ventricular enlargement with borderline LVEF 50%. °2. There is no late gadolinium enhancement in the left ventricular myocardium. °3.  Normal right ventricular chamber size and function, RVEF 56%. ° °Recent Labs:  °07/10/2020: °Glucose 87, BUN/Cr 13/0.66. EGFR 108. Na/K 137/3.8. Rest of the CMP normal °H/H 13/39. MCV 85. Platelets 228 ° °12/19/2019: °Glucose 96, BUN/Cr 8/0.66. EGFR normal. Na/K 141/3.5.  °NT-pro BNP 60 ° °10/17/2019: °Glucose 95, BUN/Cr 8/0.69. EGFR normal. Na/K 141/3.9.  °NT-pro BNP 482 ° °10/07/2019: °Glucose 102, BUN/Cr 9/0.78. EGFR 108. Na/K 143/4.1. Chloride 107. Rest of the CMP normal °H/H 12.2/36. MCV 91.7. Platelets 269. °HbA1C 5.5% °Chol 147, TG 130, HDL 61, LDL 60. °TSH 1.483 normal °MG 1.8  °NT ProBNP 2,082 ° °09/23/2019: °BNP 101.2 ° °02/07/2019: °Glucose 88, BUN/Cr 10/0.77. EGFR >90. Na/K 137/4.0.  °H/H 14/40. MCV 88. Platelets 269 ° °Review of Systems  °Cardiovascular:  Negative for chest pain, dyspnea on exertion, leg swelling, palpitations and syncope.  ° °   °Objective:  ° ° °Vitals:  ° 06/05/21 1250  °BP: 129/86  °  129/86  Pulse: 72  Resp: 16  Temp: 97.8 F (36.6 C)  SpO2: 98%     Physical Exam Vitals and nursing note reviewed.  Constitutional:      General: She is not in acute distress. Neck:     Vascular: No JVD.  Cardiovascular:     Rate and Rhythm: Normal rate and regular rhythm.     Pulses: Intact distal pulses.     Heart sounds: Normal heart sounds. No murmur heard. Pulmonary:     Effort: Pulmonary effort is normal.     Breath sounds: Normal breath sounds. No  wheezing or rales.  Musculoskeletal:     Right lower leg: No edema.     Left lower leg: No edema.      Assessment & Recommendations:   40 year old Caucasian female with nonischemic dilated cardiomyopathy, now with recovered LVEF   Palpitations: 1 episode lasting for several hours, associated shortness of breath.  Symptoms concerning for possible tachyarrhythmia such as atrial fibrillation.  Recommend 2-week cardiac telemetry.  Chronic systolic heart failure [facility]): Nonischemic cardiomyopathy, NYHA class II.  Currently compensated.  She had relapse of HFrEF in 09/2018 (EF down to 15-20%), after near complete recovery on GDMT (Cardiac MRI EF 50% in 07/2018) LVEF recovered and stays normal (02/2021) Currently on Entresto to 97-103 mg bid, spironolactone to 25 mg daily, metoprolol succinate 50 mg daily   F/u in 6 months, unless significant abnormality identified on cardiac telemetry.   Nigel Mormon, MD [facility] [place] Pager: [phone] Office: [phone]

## 2021-06-06 ENCOUNTER — Other Ambulatory Visit: Payer: Self-pay | Admitting: Gastroenterology

## 2021-06-06 DIAGNOSIS — K219 Gastro-esophageal reflux disease without esophagitis: Secondary | ICD-10-CM

## 2021-06-22 ENCOUNTER — Other Ambulatory Visit: Payer: Self-pay | Admitting: Cardiology

## 2021-06-22 DIAGNOSIS — I5022 Chronic systolic (congestive) heart failure: Secondary | ICD-10-CM

## 2021-06-28 DIAGNOSIS — R002 Palpitations: Secondary | ICD-10-CM | POA: Diagnosis not present

## 2021-07-03 ENCOUNTER — Other Ambulatory Visit: Payer: Self-pay | Admitting: Cardiology

## 2021-07-03 DIAGNOSIS — I5022 Chronic systolic (congestive) heart failure: Secondary | ICD-10-CM

## 2021-07-10 ENCOUNTER — Other Ambulatory Visit: Payer: Self-pay | Admitting: Gastroenterology

## 2021-07-10 DIAGNOSIS — K219 Gastro-esophageal reflux disease without esophagitis: Secondary | ICD-10-CM

## 2021-08-08 ENCOUNTER — Other Ambulatory Visit: Payer: Self-pay | Admitting: Cardiology

## 2021-08-08 ENCOUNTER — Other Ambulatory Visit: Payer: Self-pay | Admitting: Gastroenterology

## 2021-08-08 DIAGNOSIS — I5022 Chronic systolic (congestive) heart failure: Secondary | ICD-10-CM

## 2021-08-08 DIAGNOSIS — K219 Gastro-esophageal reflux disease without esophagitis: Secondary | ICD-10-CM

## 2021-08-19 ENCOUNTER — Other Ambulatory Visit: Payer: Self-pay | Admitting: *Deleted

## 2021-08-19 DIAGNOSIS — K219 Gastro-esophageal reflux disease without esophagitis: Secondary | ICD-10-CM

## 2021-08-19 MED ORDER — PANTOPRAZOLE SODIUM 40 MG PO TBEC: DELAYED_RELEASE_TABLET | ORAL | 0 refills | Status: DC

## 2021-09-08 ENCOUNTER — Other Ambulatory Visit: Payer: Self-pay | Admitting: Cardiology

## 2021-09-21 DIAGNOSIS — M79674 Pain in right toe(s): Secondary | ICD-10-CM | POA: Diagnosis not present

## 2021-10-09 ENCOUNTER — Other Ambulatory Visit: Payer: Self-pay | Admitting: Cardiology

## 2021-10-14 ENCOUNTER — Other Ambulatory Visit: Payer: Self-pay | Admitting: Gastroenterology

## 2021-10-14 DIAGNOSIS — K219 Gastro-esophageal reflux disease without esophagitis: Secondary | ICD-10-CM

## 2021-10-22 DIAGNOSIS — Z01419 Encounter for gynecological examination (general) (routine) without abnormal findings: Secondary | ICD-10-CM | POA: Diagnosis not present

## 2021-10-22 DIAGNOSIS — Z6835 Body mass index (BMI) 35.0-35.9, adult: Secondary | ICD-10-CM | POA: Diagnosis not present

## 2021-10-22 DIAGNOSIS — Z309 Encounter for contraceptive management, unspecified: Secondary | ICD-10-CM | POA: Diagnosis not present

## 2021-10-22 DIAGNOSIS — Z124 Encounter for screening for malignant neoplasm of cervix: Secondary | ICD-10-CM | POA: Diagnosis not present

## 2021-11-19 ENCOUNTER — Other Ambulatory Visit: Payer: Self-pay | Admitting: Gastroenterology

## 2021-11-19 DIAGNOSIS — K219 Gastro-esophageal reflux disease without esophagitis: Secondary | ICD-10-CM

## 2021-11-21 ENCOUNTER — Telehealth: Payer: Self-pay | Admitting: Gastroenterology

## 2021-11-21 DIAGNOSIS — K219 Gastro-esophageal reflux disease without esophagitis: Secondary | ICD-10-CM

## 2021-11-21 MED ORDER — PANTOPRAZOLE SODIUM 40 MG PO TBEC
DELAYED_RELEASE_TABLET | ORAL | 0 refills | Status: DC
Start: 2021-11-21 — End: 2021-12-16

## 2021-11-21 NOTE — Telephone Encounter (Signed)
Called patient to inform med Protonix sent to her pharmacy

## 2021-11-21 NOTE — Telephone Encounter (Signed)
Patient called requesting a refill on Protonix has run out of it. Please call the patient to advise.

## 2021-11-27 ENCOUNTER — Ambulatory Visit: Payer: BC Managed Care – PPO

## 2021-11-27 DIAGNOSIS — I5022 Chronic systolic (congestive) heart failure: Secondary | ICD-10-CM | POA: Diagnosis not present

## 2021-11-28 ENCOUNTER — Other Ambulatory Visit: Payer: Self-pay | Admitting: Cardiology

## 2021-11-28 DIAGNOSIS — I5022 Chronic systolic (congestive) heart failure: Secondary | ICD-10-CM

## 2021-12-05 ENCOUNTER — Ambulatory Visit: Payer: BC Managed Care – PPO | Admitting: Cardiology

## 2021-12-11 ENCOUNTER — Encounter: Payer: Self-pay | Admitting: Cardiology

## 2021-12-11 ENCOUNTER — Ambulatory Visit: Payer: BC Managed Care – PPO | Admitting: Cardiology

## 2021-12-11 VITALS — BP 123/74 | HR 83 | Temp 98.0°F | Resp 16 | Ht 60.0 in | Wt 181.0 lb

## 2021-12-11 DIAGNOSIS — I5022 Chronic systolic (congestive) heart failure: Secondary | ICD-10-CM

## 2021-12-11 DIAGNOSIS — I428 Other cardiomyopathies: Secondary | ICD-10-CM | POA: Diagnosis not present

## 2021-12-11 MED ORDER — SPIRONOLACTONE 25 MG PO TABS: 12.5000 mg | ORAL_TABLET | Freq: Every day | ORAL | 0 refills | Status: DC

## 2021-12-11 MED ORDER — ENTRESTO 97-103 MG PO TABS: 1.0000 | ORAL_TABLET | Freq: Two times a day (BID) | ORAL | 3 refills | Status: DC

## 2021-12-11 MED ORDER — METOPROLOL SUCCINATE ER 50 MG PO TB24: 50.0000 mg | ORAL_TABLET | Freq: Every day | ORAL | 3 refills | Status: DC

## 2021-12-11 NOTE — Progress Notes (Signed)
Patient is here for follow up visit.  Subjective:   Patient ID: Tina Colon, female    DOB: 1981-03-26, 41 y.o.   MRN: [id number]   No chief complaint on file.   HPI   41 year old Caucasian female with nonischemic dilated cardiomyopathy,HFrEF  Patient is doing well. She denies chest pain, shortness of breath, leg edema, orthopnea, PND, TIA/syncope.      Current Outpatient Medications:    AMBULATORY NON FORMULARY MEDICATION, Medication Name: Diltiazem 2%/Lidocaine 2%   Using your index finger apply a small amount of medication inside the anal opening and to the external anal area twice daily x 6 weeks., Disp: 30 g, Rfl: 1   ENTRESTO 97-103 MG, Take 1 tablet by mouth twice daily, Disp: 60 tablet, Rfl: 0   furosemide (LASIX) 20 MG tablet, Take 1 tablet (20 mg total) by mouth as needed for edema., Disp: 60 tablet, Rfl: 3   MAGNESIUM-OXIDE 400 (240 Mg) MG tablet, Take 1 tablet by mouth twice daily, Disp: 60 tablet, Rfl: 0   metoprolol succinate (TOPROL-XL) 50 MG 24 hr tablet, Take 1 tablet by mouth once daily, Disp: 30 tablet, Rfl: 0   ondansetron (ZOFRAN ODT) 4 MG disintegrating tablet, 21m ODT q4 hours prn nausea/vomit, Disp: 4 tablet, Rfl: 0   pantoprazole (PROTONIX) 40 MG tablet, Take one tablet by mouth twice a day  She needs an office appointment, Disp: 30 tablet, Rfl: 0   spironolactone (ALDACTONE) 25 MG tablet, Take 1 tablet by mouth once daily, Disp: 90 tablet, Rfl: 0   TRI-LO-MILI 0.18/0.215/0.25 MG-25 MCG tab, Take 1 tablet by mouth daily., Disp: , Rfl:   Cardiovascular studies:   EKG 12/11/2021: Sinus rhythm 83 bpm Incomplete LBBB   Echocardiogram 11/27/2021:  Normal LV systolic function with visual EF 50-55%. Left ventricle cavity  is normal in size. Normal left ventricular wall thickness. Normal global  wall motion. Normal diastolic filling pattern, normal LAP.  No significant valvular heart disease.  Compared to 02/25/2021 Mild MR/TR, PR have resolved otherwise  no  significant change.   RHC/LHC 09/26/2019: Normal coronaries Severe global hypokinesis, LVEF <15%  Cardiac MRI 07/23/2018: 1.  Mild left ventricular enlargement with borderline LVEF 50%. 2. There is no late gadolinium enhancement in the left ventricular myocardium. 3.  Normal right ventricular chamber size and function, RVEF 56%.  Recent Labs:  07/10/2020: Glucose 87, BUN/Cr 13/0.66. EGFR 108. Na/K 137/3.8. Rest of the CMP normal H/H 13/39. MCV 85. Platelets 228  Review of Systems  Cardiovascular:  Negative for chest pain, dyspnea on exertion, leg swelling, palpitations and syncope.       Objective:    Vitals:   12/11/21 1302  BP: 123/74  Pulse: 83  Resp: 16  Temp: 98 F (36.7 C)  SpO2: 96%     Physical Exam Vitals and nursing note reviewed.  Constitutional:      General: She is not in acute distress. Neck:     Vascular: No JVD.  Cardiovascular:     Rate and Rhythm: Normal rate and regular rhythm.     Pulses: Intact distal pulses.     Heart sounds: Normal heart sounds. No murmur heard. Pulmonary:     Effort: Pulmonary effort is normal.     Breath sounds: Normal breath sounds. No wheezing or rales.  Musculoskeletal:     Right lower leg: No edema.     Left lower leg: No edema.       Assessment & Recommendations:   41year old  Caucasian female with nonischemic dilated cardiomyopathy, now with recovered LVEF   Chronic systolic heart failure [facility]): Nonischemic cardiomyopathy, NYHA class II.  Currently compensated.  She had relapse of HFrEF in 09/2018 (EF down to 15-20%), after near complete recovery on GDMT (Cardiac MRI EF 50% in 07/2018) LVEF recovered and stays normal (11/2021) Currently on Entresto to 97-103 mg bid, spironolactone to 25 mg daily, metoprolol succinate 50 mg daily  Since she has done so well over a period of time, will attempt de-escalation of therapy. Will wean off spironolactone by reducing 12.5 mg daily for 2 weeks, then stop. Check  labs  F/u in 4 weeks   [place], MD [facility] [place] Pager: [phone] Office: [phone]

## 2021-12-12 NOTE — Progress Notes (Addendum)
12/16/2021 Tina Colon [id number] [birth year]  Referring provider: Horald Pollen, * Primary GI doctor: Dr. Silverio Decamp  ASSESSMENT AND PLAN:   Gastroesophageal reflux disease, unspecified whether esophagitis present -     pantoprazole (PROTONIX) 40 MG tablet; Take 1 tablet (40 mg total) by mouth daily. Well controlled on medications Will check LFTs, has had sludge in the past, no RUQ pain at this time.  Abstain from ETOH, weight loss, antireflux measures discussed  Diarrhea, unspecified type with fecal urgency  -Fecal calprotectin and CRP/ESR to rule out inflammation/IBD  -TTG/IGA to evaluate for celiac disease.  Had normal pancreatitic elastase/fecal fat last visit, creon was not helpful - Discussed FODMAP diet, avoid lactose. Abstain ETOH. -will not schedule for colonoscopy at this time, if labs are abnormal or not improving can consider endoscopic evaluation, will be due at age 18 otherwise.  Discussed possible IBS-D and xifaxin.  -     CBC with Differential/Platelet; Future -     Comprehensive metabolic panel; Future -     TSH; Future -     Sedimentation rate; Future -     High sensitivity CRP; Future -     Calprotectin, Fecal; Future -     Tissue transglutaminase, IgA; Future -     IgA; Future  Rectal fissure Wants refill- will refill, no pain at this time -     AMBULATORY NON FORMULARY MEDICATION; Medication Name: Diltiazem 2%/Lidocaine 2%  Using your index finger apply a small amount of medication inside the anal opening and to the external anal area twice daily x 6 weeks.    History of Present Illness:  41 y.o. female  with a past medical history of nonischemic dilated cardiomyopathy (echo 12/01/2019 EF 50-55% mild aortic regurg mild MR EF had improved from 15-20%), alcohol abuse, pancreatitis, GERD and others listed below, returns to clinic today for evaluation of fecal urgency and GERD. 06/19/2015 EGD Normal appearing esophagus and GE junction, the  stomach was well visualized and normal in appearance, normal appearing duodenum 11/17/2018 right upper quadrant ultrasound questionable minimal sludge no evidence of cholelithiasis or acute cholecystitis no ductal dilation, normal liver. 10/04/2020 visit with Dr. Silverio Decamp for fecal urgency, rectal pain and nausea, abstaining from alcohol do trial of Creon, normal fecal elastase and fecal fat.  She states she continues to have fecal urgency and states this is worse. Some nausea with BM, no AB pain cramping.  No fecal incontinence. Has complete bowel movements.  States if she eats she will have a BM 30 mins later, at least 3-6 x a day.  Formed or loose stools, never constipation.  No AB bloating.  She continues to drink 2 beers a day, does not smoke, no drug use.  Has had some rectal burning/itching. Denies hematochezia.  She is on pantoprazole once a day, every day and this helps with GERd/nausea/vomiting.  Has not tried imodium.    Current Medications:   Current Outpatient Medications (Endocrine & Metabolic):    TRI-LO-MILI 0.18/0.215/0.25 MG-25 MCG tab, Take 1 tablet by mouth daily.  Current Outpatient Medications (Cardiovascular):    metoprolol succinate (TOPROL-XL) 50 MG 24 hr tablet, Take 1 tablet (50 mg total) by mouth daily. Take with or immediately following a meal.   sacubitril-valsartan (ENTRESTO) 97-103 MG, Take 1 tablet by mouth 2 (two) times daily.   spironolactone (ALDACTONE) 25 MG tablet, Take 0.5 tablets (12.5 mg total) by mouth daily.   furosemide (LASIX) 20 MG tablet, Take 1 tablet (20 mg total)  by mouth as needed for edema.     Current Outpatient Medications (Other):    MAGNESIUM-OXIDE 400 (240 Mg) MG tablet, Take 1 tablet by mouth twice daily   ondansetron (ZOFRAN ODT) 4 MG disintegrating tablet, 43m ODT q4 hours prn nausea/vomit   AMBULATORY NON FORMULARY MEDICATION, Medication Name: Diltiazem 2%/Lidocaine 2%   Using your index finger apply a small amount of  medication inside the anal opening and to the external anal area twice daily x 6 weeks.   pantoprazole (PROTONIX) 40 MG tablet, Take 1 tablet (40 mg total) by mouth daily.  Surgical History:  She  has a past surgical history that includes Wisdom tooth extraction; Esophagogastroduodenoscopy (06/19/2015); and RIGHT/LEFT HEART CATH AND CORONARY ANGIOGRAPHY (N/A, 09/26/2019). Family History:  Her family history includes Diabetes in her mother and another family member; Gallstones in her paternal aunt and sister; Gout in her father; Heart disease in an other family member; Hyperlipidemia in her mother; Hypertension in her father; Irritable bowel syndrome in an other family member; Stomach cancer in her maternal grandfather. Social History:   reports that she quit smoking about 12 years ago. Her smoking use included cigarettes. She has a 5.00 pack-year smoking history. She has never used smokeless tobacco. She reports that she does not currently use alcohol. She reports current drug use. Drug: Marijuana.  Current Medications, Allergies, Past Medical History, Past Surgical History, Family History and Social History were reviewed in [employer].  Physical Exam: BP 98/90   Pulse 100   Ht 5' 1"  (1.549 m)   Wt 182 lb (82.6 kg)   BMI 34.39 kg/m  General:   Pleasant, well developed female in no acute distress Heart : Regular rate and rhythm; no murmurs Pulm: Clear anteriorly; no wheezing Abdomen:  Soft, Obese AB, Active bowel sounds. mild tenderness in the epigastrium. Without guarding and Without rebound, No organomegaly appreciated. Rectal: Not evaluated Extremities:  without  edema. Neurologic:  Alert and  oriented x4;  No focal deficits.  Psych:  Cooperative. Normal mood and affect.   AVladimir Crofts PA-C 12/16/21

## 2021-12-16 ENCOUNTER — Encounter: Payer: Self-pay | Admitting: Physician Assistant

## 2021-12-16 ENCOUNTER — Ambulatory Visit (INDEPENDENT_AMBULATORY_CARE_PROVIDER_SITE_OTHER): Payer: BC Managed Care – PPO | Admitting: Physician Assistant

## 2021-12-16 ENCOUNTER — Other Ambulatory Visit (INDEPENDENT_AMBULATORY_CARE_PROVIDER_SITE_OTHER): Payer: BC Managed Care – PPO

## 2021-12-16 VITALS — BP 98/90 | HR 100 | Ht 61.0 in | Wt 182.0 lb

## 2021-12-16 DIAGNOSIS — R152 Fecal urgency: Secondary | ICD-10-CM

## 2021-12-16 DIAGNOSIS — R197 Diarrhea, unspecified: Secondary | ICD-10-CM

## 2021-12-16 DIAGNOSIS — K602 Anal fissure, unspecified: Secondary | ICD-10-CM

## 2021-12-16 DIAGNOSIS — K219 Gastro-esophageal reflux disease without esophagitis: Secondary | ICD-10-CM

## 2021-12-16 LAB — COMPREHENSIVE METABOLIC PANEL
ALT: 11 U/L (ref 0–35)
AST: 12 U/L (ref 0–37)
Albumin: 4.4 g/dL (ref 3.5–5.2)
Alkaline Phosphatase: 43 U/L (ref 39–117)
BUN: 14 mg/dL (ref 6–23)
CO2: 23 mEq/L (ref 19–32)
Calcium: 9.6 mg/dL (ref 8.4–10.5)
Chloride: 105 mEq/L (ref 96–112)
Creatinine, Ser: 0.7 mg/dL (ref 0.40–1.20)
GFR: 106.06 mL/min (ref >60.00)
Glucose, Bld: 119 mg/dL — ABNORMAL HIGH (ref 70–99)
Potassium: 3.9 mEq/L (ref 3.5–5.1)
Sodium: 137 mEq/L (ref 135–145)
Total Bilirubin: 0.3 mg/dL (ref 0.2–1.2)
Total Protein: 7.2 g/dL (ref 6.0–8.3)

## 2021-12-16 LAB — CBC WITH DIFFERENTIAL/PLATELET
Basophils Absolute: 0.1 10*3/uL (ref 0.0–0.1)
Basophils Relative: 1.1 % (ref 0.0–3.0)
Eosinophils Absolute: 0.1 10*3/uL (ref 0.0–0.7)
Eosinophils Relative: 1.4 % (ref 0.0–5.0)
HCT: 40.1 % (ref 36.0–46.0)
Hemoglobin: 13.6 g/dL (ref 12.0–15.0)
Lymphocytes Relative: 21.4 % (ref 12.0–46.0)
Lymphs Abs: 1.7 10*3/uL (ref 0.7–4.0)
MCHC: 33.8 g/dL (ref 30.0–36.0)
MCV: 90.5 fl (ref 78.0–100.0)
Monocytes Absolute: 0.4 10*3/uL (ref 0.1–1.0)
Monocytes Relative: 5.1 % (ref 3.0–12.0)
Neutro Abs: 5.7 10*3/uL (ref 1.4–7.7)
Neutrophils Relative %: 71 % (ref 43.0–77.0)
Platelets: 221 10*3/uL (ref 150.0–400.0)
RBC: 4.43 Mil/uL (ref 3.87–5.11)
RDW: 13.4 % (ref 11.5–15.5)
WBC: 8 10*3/uL (ref 4.0–10.5)

## 2021-12-16 LAB — HIGH SENSITIVITY CRP: CRP, High Sensitivity: 9.39 mg/L — ABNORMAL HIGH (ref 0.000–5.000)

## 2021-12-16 LAB — TSH: TSH: 1.35 u[IU]/mL (ref 0.35–5.50)

## 2021-12-16 LAB — SEDIMENTATION RATE: Sed Rate: 22 mm/hr — ABNORMAL HIGH (ref 0–20)

## 2021-12-16 MED ORDER — AMBULATORY NON FORMULARY MEDICATION: 1 refills | Status: DC

## 2021-12-16 MED ORDER — PANTOPRAZOLE SODIUM 40 MG PO TBEC: 40.0000 mg | DELAYED_RELEASE_TABLET | Freq: Every day | ORAL | 3 refills | Status: DC

## 2021-12-16 NOTE — Patient Instructions (Addendum)
Your provider has requested that you go to the basement level for lab work before leaving today. Press "B" on the elevator. The lab is located at the first door on the left as you exit the elevator.  [facility]:  The following medication has been prescribed:  Diltiazem/lidocaine This medication MUST be compounded by a compounding pharmacy. Your prescription has been sent to:  [facility] Address: [address] [place], [place] [address]  [phone]  Please DO NOT go directly from our office to pick up this medication! Give the pharmacy 1 day to process the prescription. Extra time is required for them to compound your medication.   Okay to take imodium as needed Stop alcohol  If diarrhea is severe - 4+ watery diarrhea episodes, take imodium to reduce fluid loss, and start on electrolyte supplemented fluids.   Please go to the ER if you have any severe AB pain, unable to hold down food/water, blood in stool or vomit, chest pain, shortness of breath, or any worsening symptoms.    Consider keeping a food diary- common causes of diarrhea are dairy, certain carbs...   FODMAP stands for fermentable oligo-, di-, mono-saccharides and polyols (1). These are the scientific terms used to classify groups of carbs that are notorious for triggering digestive symptoms like bloating, gas and stomach pain.   FODMAPs are found in a wide range of foods in varying amounts. Some foods contain just one type, while others contain several.  The main dietary sources of the four groups of FODMAPs include:  Oligosaccharides: Wheat, rye, legumes and various fruits and vegetables, such as garlic and onions.  Disaccharides: Milk, yogurt and soft cheese. Lactose is the main carb.  Monosaccharides: Various fruit including figs and mangoes, and sweeteners such as honey and agave nectar. Fructose is the main carb.  Polyols: Certain fruits and vegetables including blackberries and  lychee, as well as some low-calorie sweeteners like those in sugar-free gum.   Keep a food diary. This will help you identify foods that cause symptoms. Write down: What you eat and when. What symptoms you have. When symptoms occur in relation to your meals. Avoid foods that cause symptoms. Talk with your dietitian about other ways to get the same nutrients that are in these foods. Eat your meals slowly, in a relaxed setting. Aim to eat 5-6 small meals per day. Do not skip meals. Drink enough fluids to keep your urine clear or pale yellow. If dairy products cause your symptoms to flare up, try eating less of them. You might be able to handle yogurt better than other dairy products because it contains bacteria that help with digestion.

## 2021-12-17 LAB — TISSUE TRANSGLUTAMINASE, IGA: (tTG) Ab, IgA: <1 U/mL

## 2021-12-17 LAB — IGA: Immunoglobulin A: 211 mg/dL (ref 47–310)

## 2021-12-19 ENCOUNTER — Other Ambulatory Visit: Payer: BC Managed Care – PPO

## 2021-12-19 DIAGNOSIS — R197 Diarrhea, unspecified: Secondary | ICD-10-CM

## 2021-12-19 DIAGNOSIS — R152 Fecal urgency: Secondary | ICD-10-CM | POA: Diagnosis not present

## 2021-12-23 DIAGNOSIS — I5022 Chronic systolic (congestive) heart failure: Secondary | ICD-10-CM | POA: Diagnosis not present

## 2021-12-23 LAB — CALPROTECTIN, FECAL: Calprotectin, Fecal: 34 ug/g (ref 0–120)

## 2021-12-24 LAB — PRO B NATRIURETIC PEPTIDE: NT-Pro BNP: 90 pg/mL (ref 0–130)

## 2021-12-30 DIAGNOSIS — Z0001 Encounter for general adult medical examination with abnormal findings: Secondary | ICD-10-CM | POA: Diagnosis not present

## 2022-01-08 ENCOUNTER — Encounter: Payer: Self-pay | Admitting: Cardiology

## 2022-01-08 ENCOUNTER — Ambulatory Visit: Payer: BC Managed Care – PPO | Admitting: Cardiology

## 2022-01-08 DIAGNOSIS — I5022 Chronic systolic (congestive) heart failure: Secondary | ICD-10-CM

## 2022-01-08 MED ORDER — SPIRONOLACTONE 25 MG PO TABS: 12.5000 mg | ORAL_TABLET | ORAL | 0 refills | Status: DC

## 2022-01-08 NOTE — Progress Notes (Signed)
Patient is here for follow up visit.  Subjective:   Patient ID: Tina Colon, female    DOB: [birth year], 41 y.o.   MRN: [id number]   Chief Complaint  Patient presents with   Congestive Heart Failure   Follow-up    4 week   Results    Echo and labs     HPI   41 year old Caucasian female with nonischemic dilated cardiomyopathy,HFrEF  Patient is doing well. She denies chest pain, shortness of breath, leg edema, orthopnea, PND, TIA/syncope.  Patient is currently taking spironolactone 12.5 mg daily.  She would like to come off it, if possible.    Current Outpatient Medications:    AMBULATORY NON FORMULARY MEDICATION, Medication Name: Diltiazem 2%/Lidocaine 2%   Using your index finger apply a small amount of medication inside the anal opening and to the external anal area twice daily x 6 weeks., Disp: 30 g, Rfl: 1   MAGNESIUM-OXIDE 400 (240 Mg) MG tablet, Take 1 tablet by mouth twice daily, Disp: 60 tablet, Rfl: 0   metoprolol succinate (TOPROL-XL) 50 MG 24 hr tablet, Take 1 tablet (50 mg total) by mouth daily. Take with or immediately following a meal., Disp: 90 tablet, Rfl: 3   ondansetron (ZOFRAN ODT) 4 MG disintegrating tablet, 84m ODT q4 hours prn nausea/vomit, Disp: 4 tablet, Rfl: 0   pantoprazole (PROTONIX) 40 MG tablet, Take 1 tablet (40 mg total) by mouth daily., Disp: 90 tablet, Rfl: 3   sacubitril-valsartan (ENTRESTO) 97-103 MG, Take 1 tablet by mouth 2 (two) times daily., Disp: 180 tablet, Rfl: 3   spironolactone (ALDACTONE) 25 MG tablet, Take 0.5 tablets (12.5 mg total) by mouth daily., Disp: 15 tablet, Rfl: 0   TRI-LO-MILI 0.18/0.215/0.25 MG-25 MCG tab, Take 1 tablet by mouth daily., Disp: , Rfl:    furosemide (LASIX) 20 MG tablet, Take 1 tablet (20 mg total) by mouth as needed for edema., Disp: 60 tablet, Rfl: 3  Cardiovascular studies:   EKG 12/11/2021: Sinus rhythm 83 bpm Incomplete LBBB   Echocardiogram 11/27/2021:  Normal LV systolic function with visual  EF 50-55%. Left ventricle cavity  is normal in size. Normal left ventricular wall thickness. Normal global  wall motion. Normal diastolic filling pattern, normal LAP.  No significant valvular heart disease.  Compared to 02/25/2021 Mild MR/TR, PR have resolved otherwise no  significant change.   RHC/LHC 09/26/2019: Normal coronaries Severe global hypokinesis, LVEF <15%  Cardiac MRI 07/23/2018: 1.  Mild left ventricular enlargement with borderline LVEF 50%. 2. There is no late gadolinium enhancement in the left ventricular myocardium. 3.  Normal right ventricular chamber size and function, RVEF 56%.  Recent Labs:  12/16/2021: Glucose 119, BUN/Cr 14/0.7. EGFR 106. Na/K 137/3.9. Rest of the CMP normal NT proBNP 90 CRP 9.3 high (Nonspecific) H/H 13/40. MCV 90. Platelets 221 TSH 1.3 normal   Review of Systems  Cardiovascular:  Negative for chest pain, dyspnea on exertion, leg swelling, palpitations and syncope.       Objective:    Vitals:   01/08/22 1442  BP: 134/83  Pulse: 80  Resp: 16  Temp: 98 F (36.7 C)  SpO2: 96%      Physical Exam Vitals and nursing note reviewed.  Constitutional:      General: She is not in acute distress. Neck:     Vascular: No JVD.  Cardiovascular:     Rate and Rhythm: Normal rate and regular rhythm.     Pulses: Intact distal pulses.  Heart sounds: Normal heart sounds. No murmur heard. Pulmonary:     Effort: Pulmonary effort is normal.     Breath sounds: Normal breath sounds. No wheezing or rales.  Musculoskeletal:     Right lower leg: No edema.     Left lower leg: No edema.       Assessment & Recommendations:   41 year old Caucasian female with nonischemic dilated cardiomyopathy, now with recovered LVEF   Chronic systolic heart failure [facility]): Nonischemic cardiomyopathy, NYHA class II.  Currently compensated.  She had relapse of HFrEF in 09/2018 (EF down to 15-20%), after near complete recovery on GDMT (Cardiac MRI EF 50% in  07/2018) LVEF recovered and stays normal (11/2021) Currently on Entresto to 97-103 mg bid, spironolactone to 25 mg daily, metoprolol succinate 50 mg daily  Since she has done so well over a period of time, will attempt de-escalation of therapy. Okay to stop spironolactone. If she notices any swelling in future, she could take 12.5 mg as needed once daily.   F/u in 3 months  [place], MD [facility] Pager: [phone] Office: [phone]

## 2022-01-14 DIAGNOSIS — Z1231 Encounter for screening mammogram for malignant neoplasm of breast: Secondary | ICD-10-CM | POA: Diagnosis not present

## 2022-01-18 ENCOUNTER — Other Ambulatory Visit: Payer: Self-pay | Admitting: Cardiology

## 2022-03-22 ENCOUNTER — Other Ambulatory Visit: Payer: Self-pay | Admitting: Cardiology

## 2022-04-10 ENCOUNTER — Encounter: Payer: Self-pay | Admitting: Cardiology

## 2022-04-10 ENCOUNTER — Ambulatory Visit: Payer: BC Managed Care – PPO | Admitting: Cardiology

## 2022-04-10 VITALS — BP 136/93 | HR 88 | Resp 16 | Ht 61.0 in | Wt 182.0 lb

## 2022-04-10 DIAGNOSIS — I5022 Chronic systolic (congestive) heart failure: Secondary | ICD-10-CM | POA: Diagnosis not present

## 2022-04-10 DIAGNOSIS — I10 Essential (primary) hypertension: Secondary | ICD-10-CM

## 2022-04-10 DIAGNOSIS — I428 Other cardiomyopathies: Secondary | ICD-10-CM

## 2022-04-10 NOTE — Progress Notes (Signed)
Patient is here for follow up visit.  Subjective:   Patient ID: Tina Colon, female    DOB: 08-22-1980, 41 y.o.   MRN: [id number]   Chief Complaint  Patient presents with   Congestive Heart Failure   Follow-up    3 month     HPI   42 year old Caucasian female with nonischemic dilated cardiomyopathy,HFrEF  Patient is doing well. She denies chest pain, shortness of breath, leg edema, orthopnea, PND, TIA/syncope.  Patient is currently taking spironolactone 12.5 mg daily.  Blood pressure elevated today. She has not taken his medications today.     Current Outpatient Medications:    AMBULATORY NON FORMULARY MEDICATION, Medication Name: Diltiazem 2%/Lidocaine 2%   Using your index finger apply a small amount of medication inside the anal opening and to the external anal area twice daily x 6 weeks., Disp: 30 g, Rfl: 1   furosemide (LASIX) 20 MG tablet, Take 1 tablet (20 mg total) by mouth as needed for edema., Disp: 60 tablet, Rfl: 3   MAGNESIUM-OXIDE 400 (240 Mg) MG tablet, Take 1 tablet by mouth twice daily, Disp: 60 tablet, Rfl: 0   metoprolol succinate (TOPROL-XL) 50 MG 24 hr tablet, Take 1 tablet (50 mg total) by mouth daily. Take with or immediately following a meal., Disp: 90 tablet, Rfl: 3   ondansetron (ZOFRAN ODT) 4 MG disintegrating tablet, 77m ODT q4 hours prn nausea/vomit, Disp: 4 tablet, Rfl: 0   pantoprazole (PROTONIX) 40 MG tablet, Take 1 tablet (40 mg total) by mouth daily., Disp: 90 tablet, Rfl: 3   sacubitril-valsartan (ENTRESTO) 97-103 MG, Take 1 tablet by mouth 2 (two) times daily., Disp: 180 tablet, Rfl: 3   spironolactone (ALDACTONE) 25 MG tablet, Take 0.5 tablets (12.5 mg total) by mouth as directed. Daily as needed only in case of leg edema, Disp: 1 tablet, Rfl: 0   TRI-LO-MILI 0.18/0.215/0.25 MG-25 MCG tab, Take 1 tablet by mouth daily., Disp: , Rfl:   Cardiovascular studies:   EKG 12/11/2021: Sinus rhythm 83 bpm Incomplete LBBB   Echocardiogram  11/27/2021:  Normal LV systolic function with visual EF 50-55%. Left ventricle cavity  is normal in size. Normal left ventricular wall thickness. Normal global  wall motion. Normal diastolic filling pattern, normal LAP.  No significant valvular heart disease.  Compared to 02/25/2021 Mild MR/TR, PR have resolved otherwise no  significant change.   RHC/LHC 09/26/2019: Normal coronaries Severe global hypokinesis, LVEF <15%  Cardiac MRI 07/23/2018: 1.  Mild left ventricular enlargement with borderline LVEF 50%. 2. There is no late gadolinium enhancement in the left ventricular myocardium. 3.  Normal right ventricular chamber size and function, RVEF 56%.  Recent Labs:  12/16/2021: Glucose 119, BUN/Cr 14/0.7. EGFR 106. Na/K 137/3.9. Rest of the CMP normal NT proBNP 90 CRP 9.3 high (Nonspecific) H/H 13/40. MCV 90. Platelets 221 TSH 1.3 normal   Review of Systems  Cardiovascular:  Negative for chest pain, dyspnea on exertion, leg swelling, palpitations and syncope.       Objective:    Vitals:   04/10/22 1405 04/10/22 1406  BP: (!) 148/102 (!) 136/93  Pulse: 85 88  Resp: 16   SpO2: 96% 98%      Physical Exam Vitals and nursing note reviewed.  Constitutional:      General: She is not in acute distress. Neck:     Vascular: No JVD.  Cardiovascular:     Rate and Rhythm: Normal rate and regular rhythm.     Pulses: Intact  distal pulses.     Heart sounds: Normal heart sounds. No murmur heard. Pulmonary:     Effort: Pulmonary effort is normal.     Breath sounds: Normal breath sounds. No wheezing or rales.  Musculoskeletal:     Right lower leg: No edema.     Left lower leg: No edema.       Assessment & Recommendations:   41 year old Caucasian female with nonischemic dilated cardiomyopathy, now with recovered LVEF   Chronic systolic heart failure [facility]): Nonischemic cardiomyopathy, NYHA class II.  Currently compensated.  She had relapse of HFrEF in 09/2018 (EF down to  15-20%), after near complete recovery on GDMT (Cardiac MRI EF 50% in 07/2018) LVEF recovered and stays normal (11/2021) Continue Entresto to 97-103 mg bid, metoprolol succinate 50 mg daily, spironolactone 12.5 mg daily. Echocardiogram in 10/2022  F/u in 6/20243 months   Nigel Mormon, MD Pager: [phone] Office: [phone]

## 2022-04-14 ENCOUNTER — Other Ambulatory Visit: Payer: Self-pay | Admitting: Cardiology

## 2022-04-17 ENCOUNTER — Emergency Department (HOSPITAL_BASED_OUTPATIENT_CLINIC_OR_DEPARTMENT_OTHER): Payer: BC Managed Care – PPO

## 2022-04-17 ENCOUNTER — Encounter (HOSPITAL_BASED_OUTPATIENT_CLINIC_OR_DEPARTMENT_OTHER): Payer: Self-pay

## 2022-04-17 ENCOUNTER — Emergency Department (HOSPITAL_BASED_OUTPATIENT_CLINIC_OR_DEPARTMENT_OTHER)
Admission: EM | Admit: 2022-04-17 | Discharge: 2022-04-18 | Disposition: A | Discharge status: Home / Self Care | Payer: BC Managed Care – PPO | Attending: Emergency Medicine | Admitting: Emergency Medicine

## 2022-04-17 DIAGNOSIS — R000 Tachycardia, unspecified: Secondary | ICD-10-CM | POA: Insufficient documentation

## 2022-04-17 DIAGNOSIS — Z79899 Other long term (current) drug therapy: Secondary | ICD-10-CM | POA: Diagnosis not present

## 2022-04-17 DIAGNOSIS — R197 Diarrhea, unspecified: Secondary | ICD-10-CM | POA: Insufficient documentation

## 2022-04-17 DIAGNOSIS — R9431 Abnormal electrocardiogram [ECG] [EKG]: Secondary | ICD-10-CM | POA: Insufficient documentation

## 2022-04-17 DIAGNOSIS — R112 Nausea with vomiting, unspecified: Secondary | ICD-10-CM | POA: Insufficient documentation

## 2022-04-17 DIAGNOSIS — R079 Chest pain, unspecified: Secondary | ICD-10-CM | POA: Diagnosis not present

## 2022-04-17 DIAGNOSIS — R1013 Epigastric pain: Secondary | ICD-10-CM | POA: Insufficient documentation

## 2022-04-17 DIAGNOSIS — I110 Hypertensive heart disease with heart failure: Secondary | ICD-10-CM | POA: Diagnosis not present

## 2022-04-17 DIAGNOSIS — I509 Heart failure, unspecified: Secondary | ICD-10-CM | POA: Insufficient documentation

## 2022-04-17 DIAGNOSIS — R109 Unspecified abdominal pain: Secondary | ICD-10-CM | POA: Diagnosis not present

## 2022-04-17 LAB — TROPONIN I (HIGH SENSITIVITY)
Troponin I (High Sensitivity): 4 ng/L (ref <18)
Troponin I (High Sensitivity): 5 ng/L (ref <18)

## 2022-04-17 LAB — COMPREHENSIVE METABOLIC PANEL
ALT: 16 U/L (ref 0–44)
AST: 26 U/L (ref 15–41)
Albumin: 4.2 g/dL (ref 3.5–5.0)
Alkaline Phosphatase: 42 U/L (ref 38–126)
Anion gap: 13 (ref 5–15)
BUN: 11 mg/dL (ref 6–20)
CO2: 23 mmol/L (ref 22–32)
Calcium: 9.2 mg/dL (ref 8.9–10.3)
Chloride: 101 mmol/L (ref 98–111)
Creatinine, Ser: 0.71 mg/dL (ref 0.44–1.00)
GFR, Estimated: >60 mL/min (ref >60)
Glucose, Bld: 169 mg/dL — ABNORMAL HIGH (ref 70–99)
Potassium: 3.1 mmol/L — ABNORMAL LOW (ref 3.5–5.1)
Sodium: 137 mmol/L (ref 135–145)
Total Bilirubin: 0.5 mg/dL (ref 0.3–1.2)
Total Protein: 7.8 g/dL (ref 6.5–8.1)

## 2022-04-17 LAB — CBC
HCT: 39.7 % (ref 36.0–46.0)
Hemoglobin: 13.7 g/dL (ref 12.0–15.0)
MCH: 30.3 pg (ref 26.0–34.0)
MCHC: 34.5 g/dL (ref 30.0–36.0)
MCV: 87.8 fL (ref 80.0–100.0)
Platelets: 264 10*3/uL (ref 150–400)
RBC: 4.52 MIL/uL (ref 3.87–5.11)
RDW: 13.1 % (ref 11.5–15.5)
WBC: 10.8 10*3/uL — ABNORMAL HIGH (ref 4.0–10.5)
nRBC: 0 % (ref 0.0–0.2)

## 2022-04-17 LAB — URINALYSIS, ROUTINE W REFLEX MICROSCOPIC
Bilirubin Urine: NEGATIVE
Glucose, UA: 100 mg/dL — AB
Ketones, ur: 40 mg/dL — AB
Leukocytes,Ua: NEGATIVE
Nitrite: NEGATIVE
Protein, ur: 100 mg/dL — AB
Specific Gravity, Urine: 1.025 (ref 1.005–1.030)
pH: 7.5 (ref 5.0–8.0)

## 2022-04-17 LAB — URINALYSIS, MICROSCOPIC (REFLEX)

## 2022-04-17 LAB — HCG, QUANTITATIVE, PREGNANCY: hCG, Beta Chain, Quant, S: <1 m[IU]/mL (ref <5)

## 2022-04-17 LAB — LIPASE, BLOOD: Lipase: 27 U/L (ref 11–51)

## 2022-04-17 MED ORDER — IOHEXOL 300 MG/ML  SOLN
100.0000 mL | Freq: Once | INTRAMUSCULAR | Status: AC | PRN
  Administered 2022-04-17: 100 mL via INTRAVENOUS

## 2022-04-17 MED ORDER — ALUM & MAG HYDROXIDE-SIMETH 200-200-20 MG/5ML PO SUSP
15.0000 mL | Freq: Once | ORAL | Status: AC
  Administered 2022-04-17: 15 mL via ORAL
  Filled 2022-04-17: qty 30

## 2022-04-17 MED ORDER — LORAZEPAM 2 MG/ML IJ SOLN
0.5000 mg | Freq: Once | INTRAMUSCULAR | Status: AC
  Administered 2022-04-17: 0.5 mg via INTRAVENOUS
  Filled 2022-04-17: qty 1

## 2022-04-17 MED ORDER — PANTOPRAZOLE SODIUM 40 MG IV SOLR: 40.0000 mg | Freq: Once | INTRAVENOUS | Status: DC

## 2022-04-17 MED ORDER — LACTATED RINGERS IV BOLUS
1000.0000 mL | Freq: Once | INTRAVENOUS | Status: AC
  Administered 2022-04-17: 1000 mL via INTRAVENOUS

## 2022-04-17 MED ORDER — DROPERIDOL 2.5 MG/ML IJ SOLN
1.2500 mg | Freq: Once | INTRAMUSCULAR | Status: AC
  Administered 2022-04-17: 1.25 mg via INTRAVENOUS
  Filled 2022-04-17: qty 2

## 2022-04-17 NOTE — ED Notes (Signed)
Pt has returned to their room 

## 2022-04-17 NOTE — ED Provider Notes (Signed)
[facility] EMERGENCY DEPARTMENT Provider Note   CSN: [id number] Arrival date & time: 04/17/22  1700     History Chief Complaint  Patient presents with   Abdominal Pain    Tina Colon is a 41 y.o. female with history of hypokalemia, CHF, NSTEMI, pancreatitis, hypertension presents emergency department for evaluation of epigastric abdominal pain that started around 1300 today with nausea, vomiting, and diarrhea.  Patient denies any coffee-ground emesis or hematemesis.  Denies any constipation, melena, hematochezia, fevers, chest pain, or shortness of breath.  Denies any recent cough or cold symptoms.  No medications trialed prior to arrival.  Patient is a former cigarette user.  Reports daily MJ use. Reports near daily consumption of ETOH, of 1-2 beers. She reports she has had this before without diagnosis. Does see GI for her h/o GERD, but reports she isn't on any medications, but then mentions that she is on pantoprazole.    Abdominal Pain Associated symptoms: diarrhea, nausea and vomiting   Associated symptoms: no chest pain, no chills, no constipation, no dysuria, no fever, no hematuria and no shortness of breath        Home Medications Prior to Admission medications   Medication Sig Start Date End Date Taking? Authorizing Provider  trimethobenzamide (TIGAN) 300 MG capsule Take 1 capsule (300 mg total) by mouth 3 (three) times daily. 04/18/22  Yes Achille Rich, PA-C  AMBULATORY NON FORMULARY MEDICATION Medication Name: Diltiazem 2%/Lidocaine 2%   Using your index finger apply a small amount of medication inside the anal opening and to the external anal area twice daily x 6 weeks. 12/16/21   Doree Albee, PA-C  furosemide (LASIX) 20 MG tablet Take 1 tablet (20 mg total) by mouth as needed for edema. 08/23/20 04/10/22  Patwardhan, Anabel Bene, MD  MAGNESIUM-OXIDE 400 (240 Mg) MG tablet Take 1 tablet by mouth twice daily 04/14/22   Patwardhan, Anabel Bene, MD  metoprolol  succinate (TOPROL-XL) 50 MG 24 hr tablet Take 1 tablet (50 mg total) by mouth daily. Take with or immediately following a meal. 12/11/21   Patwardhan, Manish J, MD  pantoprazole (PROTONIX) 40 MG tablet Take 1 tablet (40 mg total) by mouth daily. 12/16/21   Doree Albee, PA-C  sacubitril-valsartan (ENTRESTO) 97-103 MG Take 1 tablet by mouth 2 (two) times daily. 12/11/21   Patwardhan, Anabel Bene, MD  spironolactone (ALDACTONE) 25 MG tablet Take 0.5 tablets (12.5 mg total) by mouth as directed. Daily as needed only in case of leg edema 01/08/22   Patwardhan, Manish J, MD  TRI-LO-MILI 0.18/0.215/0.25 MG-25 MCG tab Take 1 tablet by mouth daily. 10/16/19   [provider]      Allergies    Penicillins    Review of Systems   Review of Systems  Constitutional:  Negative for chills and fever.  Respiratory:  Negative for shortness of breath.   Cardiovascular:  Negative for chest pain.  Gastrointestinal:  Positive for abdominal pain, diarrhea, nausea and vomiting. Negative for blood in stool and constipation.  Genitourinary:  Negative for dysuria and hematuria.    Physical Exam Updated Vital Signs BP (!) 140/90   Pulse (!) 103   Temp 98 F (36.7 C) (Oral)   Resp 20   Ht 5' (1.524 m)   Wt 78 kg   SpO2 96%   BMI 33.59 kg/m  Physical Exam Vitals and nursing note reviewed.  Constitutional:      Appearance: Normal appearance.     Comments: Uncomfortable, dry  heaving  HENT:     Head: Normocephalic and atraumatic.  Eyes:     General: No scleral icterus. Cardiovascular:     Rate and Rhythm: Regular rhythm. Tachycardia present.  Pulmonary:     Effort: Pulmonary effort is normal. No respiratory distress.     Breath sounds: Normal breath sounds.  Abdominal:     General: Bowel sounds are normal. There is no distension.     Palpations: Abdomen is soft.     Tenderness: There is abdominal tenderness in the epigastric area.     Comments: Abdominal exam limited 2/2 body habitus. Generalized  tenderness, but mainly to the epigastric area. NBS.  Musculoskeletal:        General: No deformity.     Cervical back: Normal range of motion.  Skin:    General: Skin is warm and dry.  Neurological:     General: No focal deficit present.     Mental Status: She is alert. Mental status is at baseline.     ED Results / Procedures / Treatments   Labs (all labs ordered are listed, but only abnormal results are displayed) Labs Reviewed  COMPREHENSIVE METABOLIC PANEL - Abnormal; Notable for the following components:      Result Value   Potassium 3.1 (*)    Glucose, Bld 169 (*)    All other components within normal limits  CBC - Abnormal; Notable for the following components:   WBC 10.8 (*)    All other components within normal limits  URINALYSIS, ROUTINE W REFLEX MICROSCOPIC - Abnormal; Notable for the following components:   Glucose, UA 100 (*)    Hgb urine dipstick SMALL (*)    Ketones, ur 40 (*)    Protein, ur 100 (*)    All other components within normal limits  URINALYSIS, MICROSCOPIC (REFLEX) - Abnormal; Notable for the following components:   Bacteria, UA RARE (*)    All other components within normal limits  LIPASE, BLOOD  HCG, QUANTITATIVE, PREGNANCY  TROPONIN I (HIGH SENSITIVITY)  TROPONIN I (HIGH SENSITIVITY)    EKG EKG Interpretation  Date/Time:  Friday April 18 2022 00:47:42 EST Ventricular Rate:  64 PR Interval:  159 QRS Duration: 103 QT Interval:  435 QTC Calculation: 449 R Axis:   16 Text Interpretation: Sinus rhythm Multiple premature complexes, vent & supraven Confirmed by Palumbo, April ([id number]) on 04/19/2022 9:11:54 AM  Radiology CT ABDOMEN PELVIS W CONTRAST  Result Date: 04/17/2022 CLINICAL DATA:  Epigastric pain, emesis, and diarrhea EXAM: CT ABDOMEN AND PELVIS WITH CONTRAST TECHNIQUE: Multidetector CT imaging of the abdomen and pelvis was performed using the standard protocol following bolus administration of intravenous contrast. RADIATION DOSE  REDUCTION: This exam was performed according to the departmental dose-optimization program which includes automated exposure control, adjustment of the mA and/or kV according to patient size and/or use of iterative reconstruction technique. CONTRAST:  OMNIPAQUE IOHEXOL 300 MG/ML  SOLN COMPARISON:  10/14/2018 FINDINGS: Lower chest: No focal pulmonary opacity or pleural effusion. No pericardial effusion. Hepatobiliary: The liver is normal in contour. No focal hepatic lesion. No intra or extrahepatic biliary ductal dilatation. The gallbladder is unremarkable. The hepatic and portal veins are patent. Pancreas: Unremarkable. No pancreatic ductal dilatation or surrounding inflammatory changes. Spleen: Normal in size without focal abnormality. Adrenals/Urinary Tract: Adrenal glands are unremarkable. The kidneys enhance symmetrically with no hydronephrosis. The bladder is unremarkable for degree of distension. Stomach/Bowel: Stomach is within normal limits. Appendix appears normal. No evidence of bowel wall thickening, distention, or  inflammatory changes. Vascular/Lymphatic: No significant vascular findings are present. No enlarged abdominal or pelvic lymph nodes. Reproductive: Uterus and bilateral adnexa are unremarkable. Other: No free fluid or free air in the abdomen or pelvis. Musculoskeletal: No acute fracture or suspicious osseous lesion. Redemonstrated 9 mm anterolisthesis of L5 on S1. IMPRESSION: No acute process in the abdomen or pelvis. Unchanged grade 2 anterolisthesis of L5 on S1. Electronically Signed   By: Wiliam Ke M.D.   On: 04/17/2022 20:58   DG Chest 2 View  Result Date: 04/17/2022 CLINICAL DATA:  Epigastric abdominal pain EXAM: CHEST - 2 VIEW COMPARISON:  None Available. FINDINGS: The heart size and mediastinal contours are within normal limits. Both lungs are clear. The visualized skeletal structures are unremarkable. IMPRESSION: No active cardiopulmonary disease. Electronically Signed    By: Helyn Numbers M.D.   On: 04/17/2022 19:55     Procedures Procedures   Medications Ordered in ED Medications  lactated ringers bolus 1,000 mL (0 mLs Intravenous Stopped 04/17/22 2105)  alum & mag hydroxide-simeth (MAALOX/MYLANTA) 200-200-20 MG/5ML suspension 15 mL (15 mLs Oral Given 04/17/22 1932)  LORazepam (ATIVAN) injection 0.5 mg (0.5 mg Intravenous Given 04/17/22 1917)  iohexol (OMNIPAQUE) 300 MG/ML solution 100 mL (100 mLs Intravenous Contrast Given 04/17/22 2021)  droperidol (INAPSINE) 2.5 MG/ML injection 1.25 mg (1.25 mg Intravenous Given 04/17/22 2229)    ED Course/ Medical Decision Making/ A&P                           Medical Decision Making Amount and/or Complexity of Data Reviewed Labs: ordered. Radiology: ordered.  Risk OTC drugs. Prescription drug management.   41 year old female presents the emergency department for evaluation of epigastric pain with nausea, vomiting, and diarrhea.  Differential diagnosis includes was not limited to colitis, diverticulitis, small bowel obstruction, viral gastroenteritis, electrolyte normality, dehydration.  Vital signs show blood pressure 129/102, mildly tachycardic, otherwise afebrile, satting well on room air without increased work of breathing.  Physical exam as noted above.  I independently reviewed and interpreted the patient's labs.  CMP shows mildly decreased potassium 3.1 although patient has known history of hypokalemia.  Glucose mildly elevated at 169 otherwise no electrolyte or LFT abnormalities.  Lipase within normal limits.  Initial troponin at 4, with repeat at 5.  CBC shows slight leukocytosis at 10.8, could be hemoconcentration.  No anemia.  Normal platelets.  Urinalysis shows 100 glucose with small mout of hemoglobin. Ketones and Protein present in the urine as well.  CT imaging shows No acute process in the abdomen or pelvis. Unchanged grade 2 anterolisthesis of L5 on S1.  CXR shows No active cardiopulmonary  disease.  The patient was initially given maalox, 1L LR, and some ativan for her symptoms.   She was resting peacefully while her labs and imaging were done and processed, then started to have nausea and dry-heaving again. The patient had a h/o QT prolongation, however new EKG does not show this. Will order droperidol for the patient's symptoms.   She had been here without nausea or dryheaving. She reports that she feels better and has been able to tolerate PO without emesis. Will discharge her home with Tigan. She has been here 3 hours after droperidol administration. A New EKG was taken as I noticed the patient having PACs and PVCs on the monitor. She is asymptomatic from this. Discussed with Dr. Read Drivers, who reports that she is already established with cardiology and can follow up with  them outpatient.   I doubt any ACS given flat troponins and reassuring EKG. CT imaging without abnormality. Patient is appearing comfortable. Considered admission, but patient does not meet criteria. Patient is safe for discharge home with close outpatient follow up.  We reviewed the labs and imaging reports. Possibly cannabinoid hyperemesis syndrome given the patient's daily MJ use. We discussed following up with her cardiologist and GI provider. We discussed new prescription, Tigan, and to stop smoking MJ. We discussed strict return precautions and red flag symptoms, the patient verbalizes understanding and agrees to the plan. The patient is stable and being discharged home in good condition.    Final Clinical Impression(s) / ED Diagnoses Final diagnoses:  Epigastric pain  Nausea vomiting and diarrhea  Abnormal EKG    Rx / DC Orders ED Discharge Orders          Ordered    trimethobenzamide (TIGAN) 300 MG capsule  3 times daily        04/18/22 0111              Achille Rich, PA-C 04/20/22 0926    Melene Plan, DO 04/21/22 [phone]

## 2022-04-17 NOTE — ED Triage Notes (Signed)
Pt states epigastric pain and emesis since 1300. Pt states it started while at work . Pt states emesis has bene bile and undigested. Pt endorses diarrhea as well.   Unable to obtain labs during triage immediately, pt to restroom for diarrhea.

## 2022-04-17 NOTE — ED Notes (Signed)
Patient transported to CT 

## 2022-04-18 ENCOUNTER — Telehealth (HOSPITAL_BASED_OUTPATIENT_CLINIC_OR_DEPARTMENT_OTHER): Payer: Self-pay | Admitting: Emergency Medicine

## 2022-04-18 ENCOUNTER — Telehealth (HOSPITAL_BASED_OUTPATIENT_CLINIC_OR_DEPARTMENT_OTHER): Payer: Self-pay | Admitting: Nurse Practitioner

## 2022-04-18 MED ORDER — TRIMETHOBENZAMIDE HCL 300 MG PO CAPS: 300.0000 mg | ORAL_CAPSULE | Freq: Three times a day (TID) | ORAL | 0 refills | Status: DC

## 2022-04-18 NOTE — Telephone Encounter (Signed)
Patient was seen yesterday and prescribed tigan for N/V. Product is no longer available at pharmacies. Patient requesting another medication.  Rx for zofran submitted to [facility] in [place].

## 2022-04-18 NOTE — Discharge Instructions (Addendum)
You were seen in the emergency department today for evaluation of your abdominal pain, nausea, vomiting, and diarrhea.  Your labs show some signs of dehydration, otherwise unremarkable.  Your CT imaging does not show any acute abdominal pelvic findings.  This could be viral gastroenteritis.  I will send you home with some Tigan which is some antinausea medication you can take as needed.  While you were here, you did have multiple rounds of PVCs.  I do see that you are seen by her cardiologist before for palpitations.  I would like for you to follow-up with your cardiologist for evaluation of this.  If you have any concerns, new or worsening symptoms, please return to the nearest emergency department for evaluation.  Contact a doctor if: Your belly pain changes or gets worse. You are not hungry, or you lose weight without trying. You are having trouble pooping (constipated) or have watery poop (diarrhea) for more than 2-3 days. You have pain when you pee or poop. Your belly pain wakes you up at night. Your pain gets worse with meals, after eating, or with certain foods. You are vomiting and cannot keep anything down. You have a fever. You have blood in your pee. Get help right away if: Your pain does not go away as soon as your doctor says it should. You cannot stop vomiting. Your pain is only in areas of your belly, such as the right side or the left lower part of the belly. You have bloody or black poop, or poop that looks like tar. You have very bad pain, cramping, or bloating in your belly. You have signs of not having enough fluid or water in your body (dehydration), such as: Dark pee, very little pee, or no pee. Cracked lips. Dry mouth. Sunken eyes. Sleepiness. Weakness. You have trouble breathing or chest pain.

## 2022-05-23 ENCOUNTER — Other Ambulatory Visit: Payer: Self-pay | Admitting: Cardiology

## 2022-05-31 ENCOUNTER — Encounter (HOSPITAL_BASED_OUTPATIENT_CLINIC_OR_DEPARTMENT_OTHER): Payer: Self-pay | Admitting: Emergency Medicine

## 2022-05-31 ENCOUNTER — Other Ambulatory Visit: Payer: Self-pay

## 2022-05-31 ENCOUNTER — Emergency Department (HOSPITAL_BASED_OUTPATIENT_CLINIC_OR_DEPARTMENT_OTHER)
Admission: EM | Admit: 2022-05-31 | Discharge: 2022-06-01 | Disposition: A | Discharge status: Home / Self Care | Payer: BC Managed Care – PPO | Attending: Emergency Medicine | Admitting: Emergency Medicine

## 2022-05-31 DIAGNOSIS — R112 Nausea with vomiting, unspecified: Secondary | ICD-10-CM

## 2022-05-31 DIAGNOSIS — Z1152 Encounter for screening for COVID-19: Secondary | ICD-10-CM | POA: Diagnosis not present

## 2022-05-31 DIAGNOSIS — R1084 Generalized abdominal pain: Secondary | ICD-10-CM | POA: Diagnosis not present

## 2022-05-31 DIAGNOSIS — Z79899 Other long term (current) drug therapy: Secondary | ICD-10-CM | POA: Diagnosis not present

## 2022-05-31 DIAGNOSIS — R1013 Epigastric pain: Secondary | ICD-10-CM | POA: Diagnosis not present

## 2022-05-31 LAB — COMPREHENSIVE METABOLIC PANEL
ALT: 13 U/L (ref 0–44)
AST: 23 U/L (ref 15–41)
Albumin: 4.2 g/dL (ref 3.5–5.0)
Alkaline Phosphatase: 40 U/L (ref 38–126)
Anion gap: 13 (ref 5–15)
BUN: 12 mg/dL (ref 6–20)
CO2: 19 mmol/L — ABNORMAL LOW (ref 22–32)
Calcium: 9.2 mg/dL (ref 8.9–10.3)
Chloride: 106 mmol/L (ref 98–111)
Creatinine, Ser: 0.68 mg/dL (ref 0.44–1.00)
GFR, Estimated: >60 mL/min (ref >60)
Glucose, Bld: 201 mg/dL — ABNORMAL HIGH (ref 70–99)
Potassium: 3.1 mmol/L — ABNORMAL LOW (ref 3.5–5.1)
Sodium: 138 mmol/L (ref 135–145)
Total Bilirubin: 0.6 mg/dL (ref 0.3–1.2)
Total Protein: 7.9 g/dL (ref 6.5–8.1)

## 2022-05-31 LAB — CBC WITH DIFFERENTIAL/PLATELET
Abs Immature Granulocytes: 0.08 10*3/uL — ABNORMAL HIGH (ref 0.00–0.07)
Basophils Absolute: 0.1 10*3/uL (ref 0.0–0.1)
Basophils Relative: 0 %
Eosinophils Absolute: 0 10*3/uL (ref 0.0–0.5)
Eosinophils Relative: 0 %
HCT: 36.6 % (ref 36.0–46.0)
Hemoglobin: 12.7 g/dL (ref 12.0–15.0)
Immature Granulocytes: 1 %
Lymphocytes Relative: 7 %
Lymphs Abs: 1.1 10*3/uL (ref 0.7–4.0)
MCH: 30.5 pg (ref 26.0–34.0)
MCHC: 34.7 g/dL (ref 30.0–36.0)
MCV: 87.8 fL (ref 80.0–100.0)
Monocytes Absolute: 0.3 10*3/uL (ref 0.1–1.0)
Monocytes Relative: 2 %
Neutro Abs: 14.8 10*3/uL — ABNORMAL HIGH (ref 1.7–7.7)
Neutrophils Relative %: 90 %
Platelets: 259 10*3/uL (ref 150–400)
RBC: 4.17 MIL/uL (ref 3.87–5.11)
RDW: 13.1 % (ref 11.5–15.5)
WBC: 16.4 10*3/uL — ABNORMAL HIGH (ref 4.0–10.5)
nRBC: 0 % (ref 0.0–0.2)

## 2022-05-31 LAB — LIPASE, BLOOD: Lipase: 32 U/L (ref 11–51)

## 2022-05-31 LAB — RESP PANEL BY RT-PCR (RSV, FLU A&B, COVID)  RVPGX2
Influenza A by PCR: NEGATIVE
Influenza B by PCR: NEGATIVE
Resp Syncytial Virus by PCR: NEGATIVE
SARS Coronavirus 2 by RT PCR: NEGATIVE

## 2022-05-31 MED ORDER — ALUM & MAG HYDROXIDE-SIMETH 200-200-20 MG/5ML PO SUSP
30.0000 mL | Freq: Once | ORAL | Status: AC
  Administered 2022-06-01: 30 mL via ORAL
  Filled 2022-05-31: qty 30

## 2022-05-31 MED ORDER — LIDOCAINE VISCOUS HCL 2 % MT SOLN
15.0000 mL | Freq: Once | OROMUCOSAL | Status: AC
  Administered 2022-06-01: 15 mL via ORAL
  Filled 2022-05-31: qty 15

## 2022-05-31 MED ORDER — POTASSIUM CHLORIDE CRYS ER 20 MEQ PO TBCR
40.0000 meq | EXTENDED_RELEASE_TABLET | Freq: Once | ORAL | Status: AC
  Administered 2022-06-01: 40 meq via ORAL
  Filled 2022-05-31: qty 2

## 2022-05-31 MED ORDER — SODIUM CHLORIDE 0.9 % IV BOLUS
1000.0000 mL | Freq: Once | INTRAVENOUS | Status: AC
  Administered 2022-05-31: 1000 mL via INTRAVENOUS

## 2022-05-31 MED ORDER — DROPERIDOL 2.5 MG/ML IJ SOLN
1.2500 mg | Freq: Once | INTRAMUSCULAR | Status: AC
  Administered 2022-05-31: 1.25 mg via INTRAVENOUS
  Filled 2022-05-31: qty 2

## 2022-05-31 MED ORDER — LORAZEPAM 2 MG/ML IJ SOLN
0.5000 mg | Freq: Once | INTRAMUSCULAR | Status: AC
  Administered 2022-05-31: 0.5 mg via INTRAVENOUS
  Filled 2022-05-31: qty 1

## 2022-05-31 NOTE — ED Triage Notes (Signed)
Acute onset epigastric pain and vomiting that started ~1700. Nausea started ~30 min after eating. Pt loudly retching during triage, but no vomiting. Denies fever, chest pain. States "a little shortness of breath". Pt diaphoretic and moaning. Reports lightheadedness.

## 2022-05-31 NOTE — ED Provider Notes (Signed)
[facility] EMERGENCY DEPARTMENT Provider Note   CSN: [id number] Arrival date & time: 05/31/22  1924     History  Chief Complaint  Patient presents with   Abdominal Pain    Tina Colon is a 41 y.o. female.   Abdominal Pain Pain location:  Epigastric Pain quality: aching   Pain radiates to:  Does not radiate Pain severity:  Severe Onset quality:  Gradual Duration:  1 hour Timing:  Constant Progression:  Unchanged Chronicity:  Recurrent Context: retching   Worsened by:  Nothing Ineffective treatments:  None tried Associated symptoms: nausea and vomiting   Associated symptoms: no chest pain, no chills, no constipation, no cough, no diarrhea, no dysuria, no fatigue and no fever        Home Medications Prior to Admission medications   Medication Sig Start Date End Date Taking? Authorizing Provider  AMBULATORY NON FORMULARY MEDICATION Medication Name: Diltiazem 2%/Lidocaine 2%   Using your index finger apply a small amount of medication inside the anal opening and to the external anal area twice daily x 6 weeks. 12/16/21   Doree Albee, PA-C  furosemide (LASIX) 20 MG tablet Take 1 tablet (20 mg total) by mouth as needed for edema. 08/23/20 04/10/22  Elder Negus, MD  MAGnesium-Oxide 400 (240 Mg) MG tablet Take 1 tablet by mouth twice daily 05/23/22   Patwardhan, Anabel Bene, MD  metoprolol succinate (TOPROL-XL) 50 MG 24 hr tablet Take 1 tablet (50 mg total) by mouth daily. Take with or immediately following a meal. 12/11/21   Patwardhan, Manish J, MD  pantoprazole (PROTONIX) 40 MG tablet Take 1 tablet (40 mg total) by mouth daily. 12/16/21   Doree Albee, PA-C  sacubitril-valsartan (ENTRESTO) 97-103 MG Take 1 tablet by mouth 2 (two) times daily. 12/11/21   Patwardhan, Anabel Bene, MD  spironolactone (ALDACTONE) 25 MG tablet Take 0.5 tablets (12.5 mg total) by mouth as directed. Daily as needed only in case of leg edema 01/08/22   Patwardhan, Manish J, MD   TRI-LO-MILI 0.18/0.215/0.25 MG-25 MCG tab Take 1 tablet by mouth daily. 10/16/19   [provider]  trimethobenzamide (TIGAN) 300 MG capsule Take 1 capsule (300 mg total) by mouth 3 (three) times daily. 04/18/22   Achille Rich, PA-C      Allergies    Penicillins    Review of Systems   Review of Systems  Constitutional:  Negative for chills, fatigue and fever.  HENT:  Negative for congestion.   Respiratory:  Negative for cough.   Cardiovascular:  Negative for chest pain.  Gastrointestinal:  Positive for abdominal pain, nausea and vomiting. Negative for abdominal distention, constipation and diarrhea.  Genitourinary:  Negative for dysuria, flank pain and frequency.  Musculoskeletal:  Negative for back pain.  Neurological:  Negative for light-headedness and headaches.  Psychiatric/Behavioral:  Negative for agitation and confusion.   All other systems reviewed and are negative.   Physical Exam Updated Vital Signs BP 132/86   Pulse 80   Resp 20   Ht 5' (1.524 m)   Wt 79.4 kg   LMP 05/10/2022   SpO2 100%   BMI 34.18 kg/m  Physical Exam Vitals and nursing note reviewed.  Constitutional:      General: She is not in acute distress.    Appearance: She is well-developed.  HENT:     Head: Normocephalic and atraumatic.     Nose: Nose normal. No congestion or rhinorrhea.     Mouth/Throat:  Mouth: Mucous membranes are dry.  Eyes:     Extraocular Movements: Extraocular movements intact.     Conjunctiva/sclera: Conjunctivae normal.     Pupils: Pupils are equal, round, and reactive to light.  Cardiovascular:     Rate and Rhythm: Normal rate and regular rhythm.     Heart sounds: No murmur heard. Pulmonary:     Effort: Pulmonary effort is normal. No respiratory distress.     Breath sounds: Normal breath sounds. No wheezing, rhonchi or rales.  Chest:     Chest wall: No tenderness.  Abdominal:     General: Bowel sounds are normal.     Palpations: Abdomen is soft.      Tenderness: There is no abdominal tenderness. There is no right CVA tenderness, left CVA tenderness, guarding or rebound.  Musculoskeletal:        General: No swelling or tenderness.     Cervical back: Neck supple.     Right lower leg: No edema.     Left lower leg: No edema.  Skin:    General: Skin is warm and dry.     Capillary Refill: Capillary refill takes less than 2 seconds.     Findings: No erythema or rash.  Neurological:     General: No focal deficit present.     Mental Status: She is alert.  Psychiatric:        Mood and Affect: Mood normal.     ED Results / Procedures / Treatments   Labs (all labs ordered are listed, but only abnormal results are displayed) Labs Reviewed  CBC WITH DIFFERENTIAL/PLATELET - Abnormal; Notable for the following components:      Result Value   WBC 16.4 (*)    Neutro Abs 14.8 (*)    Abs Immature Granulocytes 0.08 (*)    All other components within normal limits  COMPREHENSIVE METABOLIC PANEL - Abnormal; Notable for the following components:   Potassium 3.1 (*)    CO2 19 (*)    Glucose, Bld 201 (*)    All other components within normal limits  RESP PANEL BY RT-PCR (RSV, FLU A&B, COVID)  RVPGX2  LIPASE, BLOOD  LACTIC ACID, PLASMA  LACTIC ACID, PLASMA  PREGNANCY, URINE  URINALYSIS, ROUTINE W REFLEX MICROSCOPIC    EKG EKG Interpretation  Date/Time:  Saturday May 31 2022 20:11:21 EST Ventricular Rate:  79 PR Interval:  146 QRS Duration: 101 QT Interval:  393 QTC Calculation: 451 R Axis:   -19 Text Interpretation: Sinus arrhythmia Ventricular premature complex Borderline left axis deviation Probable anteroseptal infarct, old when compared to prior, similar appearance . NO STEMI Confirmed by Theda Belfast ([id number]) on 05/31/2022 8:52:52 PM  Radiology No results found.  Procedures Procedures    Medications Ordered in ED Medications  alum & mag hydroxide-simeth (MAALOX/MYLANTA) 200-200-20 MG/5ML suspension 30 mL (30 mLs Oral  Not Given 05/31/22 2202)    And  lidocaine (XYLOCAINE) 2 % viscous mouth solution 15 mL (15 mLs Oral Not Given 05/31/22 2202)  potassium chloride SA (KLOR-CON M) CR tablet 40 mEq (has no administration in time range)  droperidol (INAPSINE) 2.5 MG/ML injection 1.25 mg (1.25 mg Intravenous Given 05/31/22 2137)  sodium chloride 0.9 % bolus 1,000 mL (0 mLs Intravenous Stopped 05/31/22 2332)  LORazepam (ATIVAN) injection 0.5 mg (0.5 mg Intravenous Given 05/31/22 2219)    ED Course/ Medical Decision Making/ A&P  Medical Decision Making Amount and/or Complexity of Data Reviewed Labs: ordered.  Risk OTC drugs. Prescription drug management.    LAPRECIOUS AUSTILL is a 41 y.o. female with a past medical history significant for CHF, nonischemic cardiomyopathy, hypertension, gastritis, pancreatitis, and previous visit for suspected marijuana hyperemesis who presents with nausea, vomiting, and epigastric discomfort.  Patient reports that she was seen here last month with the exact same presentation.  She says that she has had no trauma and was feeling at her baseline today until around 4:30 PM when she started having the symptoms.  She reports that this 10 out of 10 epigastric abdominal pain with nausea and vomiting and retching.  She denies fevers, chills, congestion, or cough.  Denies any chest pain or shortness of breath.  Denies any constipation, diarrhea, or dysuria.  Reports some darker urine.  She reports she continues to smoke marijuana.  She is unsure of sick contacts.  On exam, lungs clear and chest nontender.  Abdomen was nontender.  Patient cannot get comfortable and was nausea and dry heaving.  She is moving all extremities.  Mucous membranes are slightly dry.  Good pulses in extremities.  Bowel sounds appreciated.  Back and flanks nontender.  Lungs clear.  EKG does not show prolonged QTc and appears similar to prior.  Chart review shows that she had a workup a month  ago for the same symptoms that included CT scan was reassuring.  We had a shared decision-making conversation and patient does not want imaging at this time.  She does not feel she needs ultrasound or CT.  She reports she would like the same medications that helped her last time and got her feeling better.  Chart review shows that she was given droperidol, Ativan, and GI cocktail.  These were ordered.  Her QTc was not prolonged today.  We will get screening labs and give her fluids.  Will hold on more extensive imaging workup after the conversation with patient and family.  Will get repeat labs including urinalysis for darkened urine.  Clinically I suspect recurrent hyperemesis or pancreatitis.  Anticipate reassessment after workup to determine disposition.  On reassessment she is sleeping comfortably without nausea vomiting or evidence of acute pain.  She is still getting the fluids.  Care transferred to oncoming team to await results of urinalysis and reassessment.  Her lipase was normal.  CBC was similar with mildly increased leukocytosis may be due to demargination from all the retching and vomiting.  Potassium was unchanged at 3.1 so we will give oral potassium.  Otherwise normal LFTs and kidney function.  If urinalysis and pregnancy test are negative and she is feeling better, anticipate discharge home like last time.         Final Clinical Impression(s) / ED Diagnoses Final diagnoses:  Generalized abdominal pain  Nausea and vomiting, unspecified vomiting type    Clinical Impression: 1. Generalized abdominal pain   2. Nausea and vomiting, unspecified vomiting type     Disposition: Care transferred to oncoming team to wait for results of urinalysis, pregnancy test, and reassess after medications.  Suspect marijuana related hyperemesis versus recurrent gastritis.  If workup reassuring, dissipate discharge home.   This note was prepared with assistance of Manufacturing engineer. Occasional wrong-word or sound-a-like substitutions may have occurred due to the inherent limitations of voice recognition software.     Edd Reppert, Canary Brim, MD 05/31/22 2350

## 2022-06-01 LAB — URINALYSIS, ROUTINE W REFLEX MICROSCOPIC
Bilirubin Urine: NEGATIVE
Glucose, UA: 250 mg/dL — AB
Ketones, ur: >=80 mg/dL — AB
Leukocytes,Ua: NEGATIVE
Nitrite: NEGATIVE
Protein, ur: 30 mg/dL — AB
Specific Gravity, Urine: 1.025 (ref 1.005–1.030)
pH: 6 (ref 5.0–8.0)

## 2022-06-01 LAB — URINALYSIS, MICROSCOPIC (REFLEX)

## 2022-06-01 LAB — PREGNANCY, URINE: Preg Test, Ur: NEGATIVE

## 2022-06-01 NOTE — ED Provider Notes (Signed)
Care of the patient assumed at the change of shift pending UA. She is here for vomiting which was improved with Droperidol and Ativan.  Physical Exam  BP (!) 148/108   Pulse (!) 112   Temp 97.9 F (36.6 C) (Axillary)   Resp 19   Ht 5' (1.524 m)   Wt 79.4 kg   LMP 05/10/2022   SpO2 92%   BMI 34.18 kg/m   Physical Exam  Procedures  Procedures  ED Course / MDM    Medical Decision Making Amount and/or Complexity of Data Reviewed Labs: ordered.  Risk OTC drugs. Prescription drug management.   UA with ketones but no infection. HCG is neg. Patient is feeling better, tolerating PO and is amenable to going home.        Pollyann Savoy, MD 06/01/22 [phone]

## 2022-06-01 NOTE — ED Notes (Signed)
Provider okay with pt vitals and sinus tach. Pt states she feels better.

## 2022-06-02 ENCOUNTER — Emergency Department (HOSPITAL_BASED_OUTPATIENT_CLINIC_OR_DEPARTMENT_OTHER): Payer: BC Managed Care – PPO

## 2022-06-02 ENCOUNTER — Emergency Department (HOSPITAL_BASED_OUTPATIENT_CLINIC_OR_DEPARTMENT_OTHER)
Admission: EM | Admit: 2022-06-02 | Discharge: 2022-06-02 | Disposition: A | Discharge status: Home / Self Care | Payer: BC Managed Care – PPO | Attending: Emergency Medicine | Admitting: Emergency Medicine

## 2022-06-02 ENCOUNTER — Encounter (HOSPITAL_BASED_OUTPATIENT_CLINIC_OR_DEPARTMENT_OTHER): Payer: Self-pay

## 2022-06-02 ENCOUNTER — Other Ambulatory Visit: Payer: Self-pay

## 2022-06-02 DIAGNOSIS — R112 Nausea with vomiting, unspecified: Secondary | ICD-10-CM | POA: Diagnosis not present

## 2022-06-02 DIAGNOSIS — F12188 Cannabis abuse with other cannabis-induced disorder: Secondary | ICD-10-CM | POA: Diagnosis not present

## 2022-06-02 DIAGNOSIS — R000 Tachycardia, unspecified: Secondary | ICD-10-CM | POA: Insufficient documentation

## 2022-06-02 DIAGNOSIS — I110 Hypertensive heart disease with heart failure: Secondary | ICD-10-CM | POA: Diagnosis not present

## 2022-06-02 DIAGNOSIS — I509 Heart failure, unspecified: Secondary | ICD-10-CM | POA: Insufficient documentation

## 2022-06-02 DIAGNOSIS — R1084 Generalized abdominal pain: Secondary | ICD-10-CM | POA: Insufficient documentation

## 2022-06-02 DIAGNOSIS — D72829 Elevated white blood cell count, unspecified: Secondary | ICD-10-CM | POA: Diagnosis not present

## 2022-06-02 DIAGNOSIS — R1110 Vomiting, unspecified: Secondary | ICD-10-CM | POA: Diagnosis not present

## 2022-06-02 DIAGNOSIS — Z79899 Other long term (current) drug therapy: Secondary | ICD-10-CM | POA: Insufficient documentation

## 2022-06-02 LAB — COMPREHENSIVE METABOLIC PANEL
ALT: 13 U/L (ref 0–44)
AST: 24 U/L (ref 15–41)
Albumin: 3.9 g/dL (ref 3.5–5.0)
Alkaline Phosphatase: 42 U/L (ref 38–126)
Anion gap: 11 (ref 5–15)
BUN: 11 mg/dL (ref 6–20)
CO2: 22 mmol/L (ref 22–32)
Calcium: 9.2 mg/dL (ref 8.9–10.3)
Chloride: 102 mmol/L (ref 98–111)
Creatinine, Ser: 0.65 mg/dL (ref 0.44–1.00)
GFR, Estimated: >60 mL/min (ref >60)
Glucose, Bld: 136 mg/dL — ABNORMAL HIGH (ref 70–99)
Potassium: 3.3 mmol/L — ABNORMAL LOW (ref 3.5–5.1)
Sodium: 135 mmol/L (ref 135–145)
Total Bilirubin: 0.8 mg/dL (ref 0.3–1.2)
Total Protein: 7.5 g/dL (ref 6.5–8.1)

## 2022-06-02 LAB — CBC
HCT: 41.1 % (ref 36.0–46.0)
Hemoglobin: 14.2 g/dL (ref 12.0–15.0)
MCH: 30.1 pg (ref 26.0–34.0)
MCHC: 34.5 g/dL (ref 30.0–36.0)
MCV: 87.3 fL (ref 80.0–100.0)
Platelets: 281 10*3/uL (ref 150–400)
RBC: 4.71 MIL/uL (ref 3.87–5.11)
RDW: 13.5 % (ref 11.5–15.5)
WBC: 18.2 10*3/uL — ABNORMAL HIGH (ref 4.0–10.5)
nRBC: 0 % (ref 0.0–0.2)

## 2022-06-02 LAB — URINALYSIS, ROUTINE W REFLEX MICROSCOPIC
Glucose, UA: NEGATIVE mg/dL
Ketones, ur: 40 mg/dL — AB
Leukocytes,Ua: NEGATIVE
Nitrite: NEGATIVE
Protein, ur: >=300 mg/dL — AB
Specific Gravity, Urine: 1.02 (ref 1.005–1.030)
pH: 5.5 (ref 5.0–8.0)

## 2022-06-02 LAB — URINALYSIS, MICROSCOPIC (REFLEX): WBC, UA: NONE SEEN WBC/hpf (ref 0–5)

## 2022-06-02 LAB — LIPASE, BLOOD: Lipase: 35 U/L (ref 11–51)

## 2022-06-02 MED ORDER — ONDANSETRON HCL 4 MG/2ML IJ SOLN
4.0000 mg | Freq: Once | INTRAMUSCULAR | Status: AC
  Administered 2022-06-02: 4 mg via INTRAVENOUS
  Filled 2022-06-02: qty 2

## 2022-06-02 MED ORDER — PROMETHAZINE HCL 25 MG RE SUPP: 25.0000 mg | Freq: Four times a day (QID) | RECTAL | 0 refills | Status: DC | PRN

## 2022-06-02 MED ORDER — KETOROLAC TROMETHAMINE 15 MG/ML IJ SOLN
15.0000 mg | Freq: Once | INTRAMUSCULAR | Status: AC
  Administered 2022-06-02: 15 mg via INTRAVENOUS
  Filled 2022-06-02: qty 1

## 2022-06-02 MED ORDER — IOHEXOL 300 MG/ML  SOLN
100.0000 mL | Freq: Once | INTRAMUSCULAR | Status: AC | PRN
  Administered 2022-06-02: 65 mL via INTRAVENOUS

## 2022-06-02 MED ORDER — METOPROLOL TARTRATE 25 MG PO TABS
50.0000 mg | ORAL_TABLET | Freq: Once | ORAL | Status: AC
  Administered 2022-06-02: 50 mg via ORAL
  Filled 2022-06-02: qty 2

## 2022-06-02 MED ORDER — LORAZEPAM 2 MG/ML IJ SOLN
1.0000 mg | Freq: Once | INTRAMUSCULAR | Status: AC
  Administered 2022-06-02: 1 mg via INTRAVENOUS
  Filled 2022-06-02: qty 1

## 2022-06-02 MED ORDER — SODIUM CHLORIDE 0.9 % IV BOLUS
1000.0000 mL | Freq: Once | INTRAVENOUS | Status: AC
  Administered 2022-06-02: 1000 mL via INTRAVENOUS

## 2022-06-02 MED ORDER — ONDANSETRON 4 MG PO TBDP: 4.0000 mg | ORAL_TABLET | Freq: Three times a day (TID) | ORAL | 0 refills | Status: DC | PRN

## 2022-06-02 NOTE — ED Triage Notes (Signed)
Pt complains of continued vomiting since she was seen Saturday. Unable to keep liquids down. Unable to take heart failure meds. Coughing up blood and has developed knot right side. Tender to touch

## 2022-06-02 NOTE — Discharge Instructions (Addendum)
You came to the emergency department today with recurrent nausea and vomiting.  Your CT scan does not show any explanation for this.   The swelling in your side feels as though it is inflammation in the muscles from your constant vomiting.  I have sent nausea medication to the pharmacy.  If you cannot tolerate the oral medication you may use the suppositories instead.  I would like you to follow-up with your PCP about your recurrent symptoms and emergency department visits.  I have also placed a referral to GI and you should receive a phone call from them in the next week to schedule an appointment.  As we discussed, marijuana can contribute to the symptoms.  This is referred to as cannabinoid hyperemesis syndrome.  You may discuss this with your PCP or GI but do your best to stop smoking marijuana.  It was a pleasure to meet you and we hope you feel better. Merry Christmas!

## 2022-06-02 NOTE — ED Provider Notes (Signed)
[facility] EMERGENCY DEPARTMENT Provider Note   CSN: [id number] Arrival date & time: 06/02/22  1339     History  Chief Complaint  Patient presents with   Emesis    Tina Colon is a 41 y.o. female with a past medical history of CHF, NSTEMI, substance use disorder and hypertension presenting today due to emesis.  She was discharged yesterday for the same but says that she continues to vomit at home and is unable to hold down any food or liquids.  Was not discharged with any medications.  Denies any fevers or chills.  No diarrhea.  Says that she now has a "bump" on her right flank and has been coughing up blood.   Emesis Associated symptoms: no chills, no diarrhea and no fever        Home Medications Prior to Admission medications   Medication Sig Start Date End Date Taking? Authorizing Provider  AMBULATORY NON FORMULARY MEDICATION Medication Name: Diltiazem 2%/Lidocaine 2%   Using your index finger apply a small amount of medication inside the anal opening and to the external anal area twice daily x 6 weeks. 12/16/21   Doree Albee, PA-C  furosemide (LASIX) 20 MG tablet Take 1 tablet (20 mg total) by mouth as needed for edema. 08/23/20 04/10/22  Elder Negus, MD  MAGnesium-Oxide 400 (240 Mg) MG tablet Take 1 tablet by mouth twice daily 05/23/22   Patwardhan, Anabel Bene, MD  metoprolol succinate (TOPROL-XL) 50 MG 24 hr tablet Take 1 tablet (50 mg total) by mouth daily. Take with or immediately following a meal. 12/11/21   Patwardhan, Manish J, MD  pantoprazole (PROTONIX) 40 MG tablet Take 1 tablet (40 mg total) by mouth daily. 12/16/21   Doree Albee, PA-C  sacubitril-valsartan (ENTRESTO) 97-103 MG Take 1 tablet by mouth 2 (two) times daily. 12/11/21   Patwardhan, Anabel Bene, MD  spironolactone (ALDACTONE) 25 MG tablet Take 0.5 tablets (12.5 mg total) by mouth as directed. Daily as needed only in case of leg edema 01/08/22   Patwardhan, Manish J, MD  TRI-LO-MILI  0.18/0.215/0.25 MG-25 MCG tab Take 1 tablet by mouth daily. 10/16/19   [provider]  trimethobenzamide (TIGAN) 300 MG capsule Take 1 capsule (300 mg total) by mouth 3 (three) times daily. 04/18/22   Achille Rich, PA-C      Allergies    Penicillins    Review of Systems   Review of Systems  Constitutional:  Negative for chills and fever.  Gastrointestinal:  Positive for nausea and vomiting. Negative for blood in stool, constipation and diarrhea.    Physical Exam Updated Vital Signs BP (!) 135/101 (BP Location: Left Arm)   Pulse (!) 130   Temp 98.4 F (36.9 C)   Resp 20   Ht 5' (1.524 m)   Wt 79.4 kg   LMP 05/10/2022   SpO2 97%   BMI 34.18 kg/m  Physical Exam Vitals and nursing note reviewed.  Constitutional:      Appearance: Normal appearance.  HENT:     Head: Normocephalic and atraumatic.     Mouth/Throat:     Mouth: Mucous membranes are dry.     Pharynx: Oropharynx is clear.  Eyes:     General: No scleral icterus.    Conjunctiva/sclera: Conjunctivae normal.  Cardiovascular:     Rate and Rhythm: Regular rhythm. Tachycardia present.  Pulmonary:     Effort: Pulmonary effort is normal. No respiratory distress.  Abdominal:     General: Abdomen  is flat.     Palpations: Abdomen is soft.     Tenderness: There is abdominal tenderness (generalized).  Skin:    General: Skin is warm and dry.     Findings: No rash.  Neurological:     Mental Status: She is alert.  Psychiatric:        Mood and Affect: Mood normal.     ED Results / Procedures / Treatments   Labs (all labs ordered are listed, but only abnormal results are displayed) Labs Reviewed  COMPREHENSIVE METABOLIC PANEL - Abnormal; Notable for the following components:      Result Value   Potassium 3.3 (*)    Glucose, Bld 136 (*)    All other components within normal limits  CBC - Abnormal; Notable for the following components:   WBC 18.2 (*)    All other components within normal limits  LIPASE,  BLOOD  URINALYSIS, ROUTINE W REFLEX MICROSCOPIC    EKG None  Radiology No results found.  Procedures Procedures   Medications Ordered in ED Medications  sodium chloride 0.9 % bolus 1,000 mL (1,000 mLs Intravenous New Bag/Given 06/02/22 1644)  metoprolol tartrate (LOPRESSOR) tablet 50 mg (50 mg Oral Given 06/02/22 1640)  ketorolac (TORADOL) 15 MG/ML injection 15 mg (15 mg Intravenous Given 06/02/22 1604)  ondansetron (ZOFRAN) injection 4 mg (4 mg Intravenous Given 06/02/22 1604)  LORazepam (ATIVAN) injection 1 mg (1 mg Intravenous Given 06/02/22 1604)  iohexol (OMNIPAQUE) 300 MG/ML solution 100 mL (65 mLs Intravenous Contrast Given 06/02/22 1609)  ondansetron (ZOFRAN) injection 4 mg (4 mg Intravenous Given 06/02/22 1806)    ED Course/ Medical Decision Making/ A&P                           Medical Decision Making Amount and/or Complexity of Data Reviewed Labs: ordered. Radiology: ordered.  Risk Prescription drug management.   41 year old female presenting today with emesis.  Differential includes cholecystitis, pancreatitis, gastroenteritis, substance use.    This is not an exhaustive differential.    Past Medical History / Co-morbidities / Social History: Marijuana use, recurrent nausea and vomiting   Additional history: I reviewed patient's visit to the ED yesterday.  She was treated with GI cocktail, droperidol Ativan and fluids   Physical Exam: Pertinent physical exam findings include Generalized abdominal tenderness  Lab Tests: I ordered, and personally interpreted labs.  The pertinent results include: WBC 18.2, 16.4  2 days ago   Imaging Studies: CT imaging ordered due to patient's increased leukocytosis and bounce back.   Cardiac Monitoring:  The patient was maintained on a cardiac monitor.  I viewed and interpreted the cardiac monitored which showed an underlying rhythm of: Sinus tachycardia   Medications: I ordered medication including fluids,  Ativan, Zofran, Toradol and patient is at home metoprolol.  She reported feeling better and was tolerating p.o.  MDM/Disposition: This is a 41 year old female presenting today with nausea and vomiting.  She was evaluated yesterday for the same.  Unremarkable evaluation yesterday.  Slight increase in her leukocytosis today so I pursued a CT scan.  This did not show any etiology for her nausea and vomiting.  It did show some inflammation around her lung bases however I suspect this is nonspecific and I have a low suspicion pneumonia at this time.  She is not having any shortness of breath or chest pain, is not febrile and I suspect she is tachycardic secondary to dehydration.  This did resolve  after fluids.  Additionally, she was unable to hold down her beta-blocker for the past 2 days so I respect some degree of reflex tachycardia.  She reported feeling better and was tolerating p.o. intake.  I have some suspicion that her presentations are secondary to cannabinoid hyperemesis syndrome.  She reports that she continues to smoke marijuana.  We discussed the importance of cessation.  She was also given a referral to gastroenterology for further evaluation.  Return precautions discussed however I sent outpatient antiemetics to her pharmacy.  EKG confirmed by Dr. Lockie Mola prior to dc woth anti emetics      Final Clinical Impression(s) / ED Diagnoses Final diagnoses:  Cannabinoid hyperemesis syndrome  Nausea and vomiting, unspecified vomiting type    Rx / DC Orders ED Discharge Orders          Ordered    Ambulatory referral to Gastroenterology        06/02/22 1800    promethazine (PHENERGAN) 25 MG suppository  Every 6 hours PRN        06/02/22 1813    ondansetron (ZOFRAN-ODT) 4 MG disintegrating tablet  Every 8 hours PRN        06/02/22 1813           Results and diagnoses were explained to the patient. Return precautions discussed in full. Patient had no additional questions and expressed  complete understanding.   This chart was dictated using voice recognition software.  Despite best efforts to proofread,  errors can occur which can change the documentation meaning.    Saddie Benders, PA-C 06/02/22 1828    Virgina Norfolk, DO 06/02/22 2011

## 2022-06-02 NOTE — ED Notes (Signed)
Patient transported to CT 

## 2022-06-10 DIAGNOSIS — R1013 Epigastric pain: Secondary | ICD-10-CM | POA: Diagnosis not present

## 2022-06-10 DIAGNOSIS — R9431 Abnormal electrocardiogram [ECG] [EKG]: Secondary | ICD-10-CM | POA: Diagnosis not present

## 2022-06-10 DIAGNOSIS — R112 Nausea with vomiting, unspecified: Secondary | ICD-10-CM | POA: Diagnosis not present

## 2022-06-10 DIAGNOSIS — E876 Hypokalemia: Secondary | ICD-10-CM | POA: Diagnosis not present

## 2022-06-11 ENCOUNTER — Telehealth: Payer: Self-pay | Admitting: Cardiology

## 2022-06-11 DIAGNOSIS — R9431 Abnormal electrocardiogram [ECG] [EKG]: Secondary | ICD-10-CM | POA: Diagnosis not present

## 2022-06-11 NOTE — Telephone Encounter (Signed)
Okay with me.  Reason: Emesis  Thanks MJP

## 2022-06-11 NOTE — Telephone Encounter (Signed)
Patient called to make hospital f/u appointment. Says she was in an out of the hospital since Christmas Eve for various stomach issues. It looks like the hospital placed a referral to [facility] gastroenterology. Patient states she currently does not want to see a specialist there, she would like to move away from that group. She requested a referral to Dr. Ulyses Amor office if possible, as he is currently accepting new patients according to patient. Please advise. She is scheduled to follow up with you on 06/30/2022.

## 2022-06-30 ENCOUNTER — Encounter: Payer: Self-pay | Admitting: Cardiology

## 2022-06-30 ENCOUNTER — Ambulatory Visit: Payer: BC Managed Care – PPO | Admitting: Cardiology

## 2022-06-30 VITALS — BP 139/97 | HR 91 | Resp 16 | Ht 60.0 in | Wt 174.0 lb

## 2022-06-30 DIAGNOSIS — I10 Essential (primary) hypertension: Secondary | ICD-10-CM

## 2022-06-30 DIAGNOSIS — R110 Nausea: Secondary | ICD-10-CM

## 2022-06-30 DIAGNOSIS — I5022 Chronic systolic (congestive) heart failure: Secondary | ICD-10-CM | POA: Diagnosis not present

## 2022-06-30 MED ORDER — SPIRONOLACTONE 25 MG PO TABS: 25.0000 mg | ORAL_TABLET | Freq: Every day | ORAL | 3 refills | Status: DC

## 2022-06-30 NOTE — Progress Notes (Signed)
Patient is here for follow up visit.  Subjective:   Patient ID: Tina Colon, female    DOB: [birth year], 42 y.o.   MRN: [id number]   Chief Complaint  Patient presents with   Congestive Heart Failure   Hospitalization Follow-up     HPI   42 year old Caucasian femaleemale with nonischemic dilated cardiomyopathy,HFrEF  Patient is stable from cardiac standpoint, denies any chest pain, shortness of breath symptoms.  Blood pressure is elevated today.  She has no GI complaints. Patient was recently seen in the ER for nausea symptoms.  She was found to have mild hypokalemia.  CT abdomen did not show significant etiology. Patient also has pain during defecation, associated nausea.  She was told to have anal fissures in the past. Patient had sought referral for [facility] for second opinion, having seen her GI previously.  This is still pending.      Current Outpatient Medications:    AMBULATORY NON FORMULARY MEDICATION, Medication Name: Diltiazem 2%/Lidocaine 2%   Using your index finger apply a small amount of medication inside the anal opening and to the external anal area twice daily x 6 weeks., Disp: 30 g, Rfl: 1   furosemide (LASIX) 20 MG tablet, Take 1 tablet (20 mg total) by mouth as needed for edema., Disp: 60 tablet, Rfl: 3   MAGnesium-Oxide 400 (240 Mg) MG tablet, Take 1 tablet by mouth twice daily, Disp: 180 tablet, Rfl: 1   metoprolol succinate (TOPROL-XL) 50 MG 24 hr tablet, Take 1 tablet (50 mg total) by mouth daily. Take with or immediately following a meal., Disp: 90 tablet, Rfl: 3   pantoprazole (PROTONIX) 40 MG tablet, Take 1 tablet (40 mg total) by mouth daily., Disp: 90 tablet, Rfl: 3   promethazine (PHENERGAN) 25 MG suppository, Place 1 suppository (25 mg total) rectally every 6 (six) hours as needed for nausea or vomiting., Disp: 12 each, Rfl: 0   sacubitril-valsartan (ENTRESTO) 97-103 MG, Take 1 tablet by mouth 2 (two) times daily., Disp: 180 tablet, Rfl: 3   TRI-LO-MILI  0.18/0.215/0.25 MG-25 MCG tab, Take 1 tablet by mouth daily., Disp: , Rfl:   Cardiovascular studies:   EKG 12/11/2021: Sinus rhythm 83 bpm Incomplete LBBB   Echocardiogram 11/27/2021:  Normal LV systolic function with visual EF 50-55%. Left ventricle cavity  is normal in size. Normal left ventricular wall thickness. Normal global  wall motion. Normal diastolic filling pattern, normal LAP.  No significant valvular heart disease.  Compared to 02/25/2021 Mild MR/TR, PR have resolved otherwise no  significant change.   RHC/LHC 09/26/2019: Normal coronaries Severe global hypokinesis, LVEF <15%  Cardiac MRI 07/23/2018: 1.  Mild left ventricular enlargement with borderline LVEF 50%. 2. There is no late gadolinium enhancement in the left ventricular myocardium. 3.  Normal right ventricular chamber size and function, RVEF 56%.  Recent Labs:  06/02/2022: Glucose 136, BUN/Cr 11/0.65. EGFR >60. Na/K 135/3.3. Rest of the CMP normal H/H 14/41. MCV 87. Platelets 251. WBC 1k  12/16/2021: Glucose 119, BUN/Cr 14/0.7. EGFR 106. Na/K 137/3.9. Rest of the CMP normal NT proBNP 90 CRP 9.3 high (Nonspecific) H/H 13/40. MCV 90. Platelets 221 TSH 1.3 normal   Review of Systems  Cardiovascular:  Negative for chest pain, dyspnea on exertion, leg swelling, palpitations and syncope.  Gastrointestinal:  Positive for nausea.       Objective:    Vitals:   06/30/22 1406 06/30/22 1413  BP: (!) 148/95 (!) 139/97  Pulse: 91 91  Resp: 16  SpO2: 97%       Physical Exam Vitals and nursing note reviewed.  Constitutional:      General: She is not in acute distress. Neck:     Vascular: No JVD.  Cardiovascular:     Rate and Rhythm: Normal rate and regular rhythm.     Pulses: Intact distal pulses.     Heart sounds: Normal heart sounds. No murmur heard. Pulmonary:     Effort: Pulmonary effort is normal.     Breath sounds: Normal breath sounds. No wheezing or rales.  Musculoskeletal:     Right  lower leg: No edema.     Left lower leg: No edema.      Assessment & Recommendations:   42 year old Caucasian female with nonischemic dilated cardiomyopathy, now with recovered LVEF   Chronic systolic heart failure [facility]): Nonischemic cardiomyopathy, NYHA class II.  Currently compensated.  She had relapse of HFrEF in 09/2018 (EF down to 15-20%), after near complete recovery on GDMT (Cardiac MRI EF 50% in 07/2018) LVEF recovered and stays normal (11/2021) Continue Entresto to 97-103 mg bid, metoprolol succinate 50 mg daily. Given hypertension and hypokalemia, I will resume spironolactone 25 mg daily.  Check BMP in 1 week.  Echocardiogram in 10/2022  Given multitude of GI complaints, second opinion referral to [facility], at patient's request.  F/u in 4 weeks for hypertension   Nigel Mormon, MD Pager: [phone] Office: [phone]

## 2022-07-01 ENCOUNTER — Encounter: Payer: Self-pay | Admitting: Cardiology

## 2022-07-02 ENCOUNTER — Other Ambulatory Visit: Payer: Self-pay | Admitting: Cardiology

## 2022-07-02 DIAGNOSIS — I5022 Chronic systolic (congestive) heart failure: Secondary | ICD-10-CM

## 2022-07-07 DIAGNOSIS — I10 Essential (primary) hypertension: Secondary | ICD-10-CM | POA: Diagnosis not present

## 2022-07-08 LAB — BASIC METABOLIC PANEL
BUN/Creatinine Ratio: 14 (ref 9–23)
BUN: 10 mg/dL (ref 6–24)
CO2: 22 mmol/L (ref 20–29)
Calcium: 9.6 mg/dL (ref 8.7–10.2)
Chloride: 102 mmol/L (ref 96–106)
Creatinine, Ser: 0.71 mg/dL (ref 0.57–1.00)
Glucose: 93 mg/dL (ref 70–99)
Potassium: 4.4 mmol/L (ref 3.5–5.2)
Sodium: 138 mmol/L (ref 134–144)
eGFR: 108 mL/min/{1.73_m2} (ref >59)

## 2022-07-30 ENCOUNTER — Ambulatory Visit: Payer: BC Managed Care – PPO | Admitting: Cardiology

## 2022-07-30 ENCOUNTER — Encounter: Payer: Self-pay | Admitting: Cardiology

## 2022-07-30 VITALS — BP 130/83 | HR 91 | Resp 16 | Ht 60.0 in | Wt 175.0 lb

## 2022-07-30 DIAGNOSIS — I5022 Chronic systolic (congestive) heart failure: Secondary | ICD-10-CM | POA: Diagnosis not present

## 2022-07-30 DIAGNOSIS — I10 Essential (primary) hypertension: Secondary | ICD-10-CM | POA: Diagnosis not present

## 2022-07-30 MED ORDER — FUROSEMIDE 20 MG PO TABS: 20.0000 mg | ORAL_TABLET | ORAL | 3 refills | Status: DC | PRN

## 2022-07-30 MED ORDER — METOPROLOL SUCCINATE ER 50 MG PO TB24: 50.0000 mg | ORAL_TABLET | Freq: Every day | ORAL | 3 refills | Status: DC

## 2022-07-30 MED ORDER — SPIRONOLACTONE 25 MG PO TABS: 25.0000 mg | ORAL_TABLET | Freq: Every day | ORAL | 3 refills | Status: DC

## 2022-07-30 MED ORDER — ENTRESTO 97-103 MG PO TABS: 1.0000 | ORAL_TABLET | Freq: Two times a day (BID) | ORAL | 3 refills | Status: DC

## 2022-07-30 NOTE — Progress Notes (Signed)
Patient is here for follow up visit.  Subjective:   Patient ID: Tina Colon, female    DOB: 04/26/1981, 42 y.o.   MRN: [id number]   Chief Complaint  Patient presents with   Congestive Heart Failure   Hypertension   Follow-up    4 week   Results    Lab results     HPI   42 year old Caucasian female with nonischemic dilated cardiomyopathy,HFrEF  Patient is doing well, denies chest pain, shortness of breath, palpitations, leg edema, orthopnea, PND, TIA/syncope.  Reviewed recent test results with the patient, details below.     Current Outpatient Medications:    AMBULATORY NON FORMULARY MEDICATION, Medication Name: Diltiazem 2%/Lidocaine 2%   Using your index finger apply a small amount of medication inside the anal opening and to the external anal area twice daily x 6 weeks., Disp: 30 g, Rfl: 1   ENTRESTO 97-103 MG, Take 1 tablet by mouth twice daily, Disp: 60 tablet, Rfl: 0   MAGnesium-Oxide 400 (240 Mg) MG tablet, Take 1 tablet by mouth twice daily, Disp: 180 tablet, Rfl: 1   metoprolol succinate (TOPROL-XL) 50 MG 24 hr tablet, Take 1 tablet (50 mg total) by mouth daily. Take with or immediately following a meal., Disp: 90 tablet, Rfl: 3   pantoprazole (PROTONIX) 40 MG tablet, Take 1 tablet (40 mg total) by mouth daily., Disp: 90 tablet, Rfl: 3   spironolactone (ALDACTONE) 25 MG tablet, Take 1 tablet (25 mg total) by mouth daily., Disp: 30 tablet, Rfl: 3   TRI-LO-MILI 0.18/0.215/0.25 MG-25 MCG tab, Take 1 tablet by mouth daily., Disp: , Rfl:    furosemide (LASIX) 20 MG tablet, Take 1 tablet (20 mg total) by mouth as needed for edema., Disp: 60 tablet, Rfl: 3   promethazine (PHENERGAN) 25 MG suppository, Place 1 suppository (25 mg total) rectally every 6 (six) hours as needed for nausea or vomiting. (Patient not taking: Reported on 07/30/2022), Disp: 12 each, Rfl: 0  Cardiovascular studies:   EKG 12/11/2021: Sinus rhythm 83 bpm Incomplete LBBB   Echocardiogram  11/27/2021:  Normal LV systolic function with visual EF 50-55%. Left ventricle cavity  is normal in size. Normal left ventricular wall thickness. Normal global  wall motion. Normal diastolic filling pattern, normal LAP.  No significant valvular heart disease.  Compared to 02/25/2021 Mild MR/TR, PR have resolved otherwise no  significant change.   RHC/LHC 09/26/2019: Normal coronaries Severe global hypokinesis, LVEF <15%  Cardiac MRI 07/23/2018: 1.  Mild left ventricular enlargement with borderline LVEF 50%. 2. There is no late gadolinium enhancement in the left ventricular myocardium. 3.  Normal right ventricular chamber size and function, RVEF 56%.  Recent Labs:  07/07/2022: Glucose 93, BUN/Cr 10/0.71. EGFR 108. Na/K 138/4.4.   06/02/2022: Glucose 136, BUN/Cr 11/0.65. EGFR >60. Na/K 135/3.3. Rest of the CMP normal H/H 14/41. MCV 87. Platelets 251. WBC 1k  12/16/2021: Glucose 119, BUN/Cr 14/0.7. EGFR 106. Na/K 137/3.9. Rest of the CMP normal NT proBNP 90 CRP 9.3 high (Nonspecific) H/H 13/40. MCV 90. Platelets 221 TSH 1.3 normal   Review of Systems  Cardiovascular:  Negative for chest pain, dyspnea on exertion, leg swelling, palpitations and syncope.       Objective:    Vitals:   07/30/22 1431  BP: 130/83  Pulse: 91  Resp: 16  SpO2: 97%      Physical Exam Vitals and nursing note reviewed.  Constitutional:      General: She is not in acute distress.  Neck:     Vascular: No JVD.  Cardiovascular:     Rate and Rhythm: Normal rate and regular rhythm.     Pulses: Intact distal pulses.     Heart sounds: Normal heart sounds. No murmur heard. Pulmonary:     Effort: Pulmonary effort is normal.     Breath sounds: Normal breath sounds. No wheezing or rales.  Musculoskeletal:     Right lower leg: No edema.     Left lower leg: No edema.       Assessment & Recommendations:   42 year old Caucasian female with nonischemic dilated cardiomyopathy, now with recovered  LVEF   Chronic systolic heart failure [facility]): Nonischemic cardiomyopathy, NYHA class II.  Currently compensated.  She had relapse of HFrEF in 09/2018 (EF down to 15-20%), after near complete recovery on GDMT (Cardiac MRI EF 50% in 07/2018) LVEF recovered and stays normal (11/2021) Continue Entresto to 97-103 mg bid, metoprolol succinate 50 mg daily. Given hypertension and hypokalemia, I will resumed spironolactone 25 mg daily, tolerating well.  Echocardiogram in 10/2022. Refilled meds for one year.   Given multitude of GI complaints, second opinion referral to [facility], at patient's request.  F/u in 1 year   Nigel Mormon, MD Pager: [phone] Office: [phone]

## 2022-09-24 ENCOUNTER — Telehealth: Payer: Self-pay | Admitting: Gastroenterology

## 2022-09-24 NOTE — Telephone Encounter (Signed)
Good afternoon Dr. Lavon Paganini  The following patient is requesting release of your care and transfer to Dr. Chales Abrahams. She expressed that she just wants to have a stable GI doctor and not a PA. Are you willing to release this patient? Please advise.

## 2022-09-24 NOTE — Telephone Encounter (Signed)
Good afternoon Dr. Chales Abrahams  The following patient is requesting you to be her GI provider. She is currently a patient of Dr. Lavon Paganini and she has approved her release of care. Are you willing to accept this patient's transfer request. Please advise to schedule.

## 2022-09-24 NOTE — Telephone Encounter (Signed)
Sure, unfortunately my schedule is booked for many weeks ahead.

## 2022-09-25 NOTE — Telephone Encounter (Signed)
Left VM to call and schedule

## 2022-09-25 NOTE — Telephone Encounter (Signed)
No problems by me RG 

## 2022-10-13 ENCOUNTER — Ambulatory Visit: Payer: BC Managed Care – PPO

## 2022-10-13 DIAGNOSIS — I5022 Chronic systolic (congestive) heart failure: Secondary | ICD-10-CM | POA: Diagnosis not present

## 2022-10-20 MED ORDER — DAPAGLIFLOZIN PROPANEDIOL 10 MG PO TABS
10.0000 mg | ORAL_TABLET | Freq: Every day | ORAL | 3 refills | Status: DC
Start: 2022-10-20 — End: 2023-09-14

## 2022-10-20 NOTE — Progress Notes (Signed)
Spoke with the patient.  Slight drop in LVEF compared to before.  Started on Farxiga 10 mg daily.  Discussed hydration.  Also discussed possible side effects of Farxiga, including lightheadedness, AKI, UTI, vaginal infections.  Repeat echocardiogram in 3 months.  Keep follow-up in February unless any new symptoms suggestive of heart failure arise.   Elder Negus, MD

## 2022-11-10 ENCOUNTER — Ambulatory Visit: Payer: BC Managed Care – PPO | Admitting: Cardiology

## 2022-12-21 ENCOUNTER — Other Ambulatory Visit: Payer: Self-pay | Admitting: Physician Assistant

## 2022-12-21 DIAGNOSIS — K219 Gastro-esophageal reflux disease without esophagitis: Secondary | ICD-10-CM

## 2023-01-19 ENCOUNTER — Ambulatory Visit (INDEPENDENT_AMBULATORY_CARE_PROVIDER_SITE_OTHER): Payer: BC Managed Care – PPO | Admitting: Gastroenterology

## 2023-01-19 ENCOUNTER — Encounter: Payer: Self-pay | Admitting: Gastroenterology

## 2023-01-19 VITALS — BP 118/80 | HR 96 | Ht 60.0 in | Wt 181.0 lb

## 2023-01-19 DIAGNOSIS — K580 Irritable bowel syndrome with diarrhea: Secondary | ICD-10-CM | POA: Diagnosis not present

## 2023-01-19 DIAGNOSIS — Z8719 Personal history of other diseases of the digestive system: Secondary | ICD-10-CM | POA: Diagnosis not present

## 2023-01-19 DIAGNOSIS — Z83719 Family history of colon polyps, unspecified: Secondary | ICD-10-CM | POA: Diagnosis not present

## 2023-01-19 DIAGNOSIS — K219 Gastro-esophageal reflux disease without esophagitis: Secondary | ICD-10-CM

## 2023-01-19 MED ORDER — PANTOPRAZOLE SODIUM 40 MG PO TBEC
40.0000 mg | DELAYED_RELEASE_TABLET | Freq: Every day | ORAL | 4 refills | Status: DC
Start: 2023-01-19 — End: 2024-04-05

## 2023-01-19 NOTE — Patient Instructions (Addendum)
_______________________________________________________  If your blood pressure at your visit was 140/90 or greater, please contact your primary care physician to follow up on this.  _______________________________________________________  If you are age 44 or older, your body mass index should be between 23-30. Your Body mass index is 35.35 kg/m. If this is out of the aforementioned range listed, please consider follow up with your Primary Care Provider.  If you are age 55 or younger, your body mass index should be between 19-25. Your Body mass index is 35.35 kg/m. If this is out of the aformentioned range listed, please consider follow up with your Primary Care Provider.   ________________________________________________________  The [facility] providers would like to encourage you to use [facility] to communicate with providers for non-urgent requests or questions.  Due to long hold times on the telephone, sending your provider a message by [facility] may be a faster and more efficient way to get a response.  Please allow 48 business hours for a response.  Please remember that this is for non-urgent requests.  _______________________________________________________  Cut down on your magnesium to 200mg  daily Continue Protonix  Due for EGD/Colonoscopy for 09-2023. Please call 2 months prior to schedule this. A letter will be sent as it gets closer.  Keep upcoming appointments  Thank you,  Dr. Lynann Bologna

## 2023-01-19 NOTE — Progress Notes (Signed)
Chief Complaint: For GI workup  Referring Provider:  Jim Like, NP      ASSESSMENT AND PLAN;   #1. GERD  #2. FH colon polyps (sis had polyps at 48)  #3. 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 Neg CT AP 05/2022  #4. H/O rectal fissure   Plan: -Continue protonix 40mg  po every day #90, 4RF -EGD/colon with miralax (at age 42) after cardiac clearance (she has appt Feb 2025) -Decrease Mg 200 every day -Stop all alcohol.   I discussed EGD/Colonoscopy- the indications, risks, alternatives and potential complications including, but not limited to bleeding, infection, reaction to meds, damage to internal organs, cardiac and/or pulmonary problems, and perforation requiring surgery. The possibility that significant findings could be missed was explained. All ? were answered. Pt consents to proceed. HPI:    Tina Colon is a 42 y.o. female  past medical history of nonischemic dilated cardiomyopathy (echo 12/01/2019 EF 50-55% mild aortic regurg mild MR EF had improved from 15-20%), alcohol abuse, pancreatitis, GERD   Lower abdominal pain with postprandial softer BMs 3 to 4/day Had negative CT Abdo/pelvis 05/2022 No nocturnal symptoms Has been taking magnesium 400 once a day  Dull rectal pain-attributed to anal fissure.  Better now.  Occ beer but not every day now  Reflux is under better control with Protonix.  No odynophagia or dysphagia. no nonsteroidals  She has appointment with cardiology February 2025  Previous GI workup: EGD 06/19/2015 EGD Normal appearing esophagus and GE junction, the stomach was well visualized and normal in appearance, normal appearing duodenum  Past Medical History:  Diagnosis Date   Cardiomyopathy (HCC)    CHF (congestive heart failure) (HCC)    ETOH abuse    Gastritis    GERD (gastroesophageal reflux disease)    Heart failure (HCC)    Pancreatitis     Past Surgical History:  Procedure Laterality Date   ESOPHAGOGASTRODUODENOSCOPY   06/19/2015    Dr Lavon Paganini   RIGHT/LEFT HEART CATH AND CORONARY ANGIOGRAPHY N/A 09/26/2019   Procedure: RIGHT/LEFT HEART CATH AND CORONARY ANGIOGRAPHY;  Surgeon: Elder Negus, MD;  Location: [facility] INVASIVE CV [facility];  Service: Cardiovascular;  Laterality: N/A;   WISDOM TOOTH EXTRACTION      Family History  Problem Relation Age of Onset   Diabetes Mother    Hyperlipidemia Mother    Hypertension Father    Gout Father    Stomach cancer Maternal Emelia Loron        York Spaniel that his siblings died with cancer also but not sure what kind   Diabetes Other    Heart disease Other    Irritable bowel syndrome Other    Gallstones Sister        Had her gallbladder removed    Gallstones Paternal Aunt        said a few on her dad's side     Social History   Tobacco Use   Smoking status: Former    Current packs/day: 0.00    Average packs/day: 0.5 packs/day for 10.0 years (5.0 ttl pk-yrs)    Types: Cigarettes    Start date: 2001    Quit date: 2011    Years since quitting: 13.6   Smokeless tobacco: Never  Vaping Use   Vaping status: Never Used  Substance Use Topics   Alcohol use: Not Currently    Comment: occ   Drug use: Yes    Types: Marijuana    Comment: last smoked 09/20/2019    Current Outpatient Medications  Medication Sig Dispense Refill   AMBULATORY NON FORMULARY MEDICATION Medication Name: Diltiazem 2%/Lidocaine 2%   Using your index finger apply a small amount of medication inside the anal opening and to the external anal area twice daily x 6 weeks. 30 g 1   dapagliflozin propanediol (FARXIGA) 10 MG TABS tablet Take 1 tablet (10 mg total) by mouth daily before breakfast. 90 tablet 3   MAGnesium-Oxide 400 (240 Mg) MG tablet Take 1 tablet by mouth twice daily 180 tablet 1   metoprolol succinate (TOPROL-XL) 50 MG 24 hr tablet Take 1 tablet (50 mg total) by mouth daily. Take with or immediately following a meal. 90 tablet 3   pantoprazole (PROTONIX) 40 MG tablet Take 1 tablet by  mouth once daily 90 tablet 0   sacubitril-valsartan (ENTRESTO) 97-103 MG Take 1 tablet by mouth 2 (two) times daily. 180 tablet 3   spironolactone (ALDACTONE) 25 MG tablet Take 1 tablet (25 mg total) by mouth daily. 90 tablet 3   TRI-LO-MILI 0.18/0.215/0.25 MG-25 MCG tab Take 1 tablet by mouth daily.     furosemide (LASIX) 20 MG tablet Take 1 tablet (20 mg total) by mouth as needed for edema. 60 tablet 3   promethazine (PHENERGAN) 25 MG suppository Place 1 suppository (25 mg total) rectally every 6 (six) hours as needed for nausea or vomiting. (Patient not taking: Reported on 01/19/2023) 12 each 0   No current facility-administered medications for this visit.    Allergies  Allergen Reactions   Penicillins Anaphylaxis    Has patient had a PCN reaction causing immediate rash, facial/tongue/throat swelling, SOB or lightheadedness with hypotension:Yes Has patient had a PCN reaction causing severe rash involving mucus membranes or skin necrosis:unknown Has patient had a PCN reaction that required hospitalization:Yes Has patient had a PCN reaction occurring within the last 10 years:No If all of the above answers are "NO", then may proceed with Cephalosporin use.     Review of Systems:  Constitutional: Denies fever, chills, diaphoresis, appetite change and fatigue.  HEENT: Denies photophobia, eye pain, redness, hearing loss, ear pain, congestion, sore throat, rhinorrhea, sneezing, mouth sores, neck pain, neck stiffness and tinnitus.   Respiratory: Denies SOB, DOE, cough, chest tightness,  and wheezing.   Cardiovascular: Denies chest pain, palpitations and leg swelling.  Genitourinary: Denies dysuria, urgency, frequency, hematuria, flank pain and difficulty urinating.  Musculoskeletal: Denies myalgias, back pain, joint swelling, arthralgias and gait problem.  Skin: No rash.  Neurological: Denies dizziness, seizures, syncope, weakness, light-headedness, numbness and headaches.  Hematological:  Denies adenopathy. Easy bruising, personal or family bleeding history  Psychiatric/Behavioral: No anxiety or depression     Physical Exam:    BP 118/80   Pulse 96   Ht 5' (1.524 m)   Wt 181 lb (82.1 kg)   BMI 35.35 kg/m  Wt Readings from Last 3 Encounters:  01/19/23 181 lb (82.1 kg)  07/30/22 175 lb (79.4 kg)  06/30/22 174 lb (78.9 kg)   Constitutional:  Well-developed, in no acute distress. Psychiatric: Normal mood and affect. Behavior is normal. HEENT: Pupils normal.  Conjunctivae are normal. No scleral icterus. Neck supple.  Cardiovascular: Normal rate, regular rhythm. No edema Pulmonary/chest: Effort normal and breath sounds normal. No wheezing, rales or rhonchi. Abdominal: Soft, nondistended. Nontender. Bowel sounds active throughout. There are no masses palpable. No hepatomegaly. Rectal: Deferred.  To be performed at the time of colonoscopy Neurological: Alert and oriented to person place and time. Skin: Skin is warm and dry. No rashes noted.  Data Reviewed: I have personally reviewed following labs and imaging studies  CBC:    Latest Ref Rng & Units 06/02/2022    3:09 PM 05/31/2022    9:43 PM 04/17/2022    6:44 PM  CBC  WBC 4.0 - 10.5 K/uL 18.2  16.4  10.8   Hemoglobin 12.0 - 15.0 g/dL 86.5  78.4  69.6   Hematocrit 36.0 - 46.0 % 41.1  36.6  39.7   Platelets 150 - 400 K/uL 281  259  264     CMP:    Latest Ref Rng & Units 07/07/2022   12:21 PM 06/02/2022    3:09 PM 05/31/2022    9:43 PM  CMP  Glucose 70 - 99 mg/dL 93  295  284   BUN 6 - 24 mg/dL 10  11  12    Creatinine 0.57 - 1.00 mg/dL 1.32  4.40  1.02   Sodium 134 - 144 mmol/L 138  135  138   Potassium 3.5 - 5.2 mmol/L 4.4  3.3  3.1   Chloride 96 - 106 mmol/L 102  102  106   CO2 20 - 29 mmol/L 22  22  19    Calcium 8.7 - 10.2 mg/dL 9.6  9.2  9.2   Total Protein 6.5 - 8.1 g/dL  7.5  7.9   Total Bilirubin 0.3 - 1.2 mg/dL  0.8  0.6   Alkaline Phos 38 - 126 U/L  42  40   AST 15 - 41 U/L  24  23   ALT 0 -  44 U/L  13  13         Edman Circle, MD 01/19/2023, 11:25 AM  Cc: Jim Like, NP

## 2023-01-26 ENCOUNTER — Other Ambulatory Visit: Payer: BC Managed Care – PPO

## 2023-01-26 DIAGNOSIS — U071 COVID-19: Secondary | ICD-10-CM | POA: Diagnosis not present

## 2023-02-23 DIAGNOSIS — Z6834 Body mass index (BMI) 34.0-34.9, adult: Secondary | ICD-10-CM | POA: Diagnosis not present

## 2023-02-23 DIAGNOSIS — Z01419 Encounter for gynecological examination (general) (routine) without abnormal findings: Secondary | ICD-10-CM | POA: Diagnosis not present

## 2023-02-23 DIAGNOSIS — Z1231 Encounter for screening mammogram for malignant neoplasm of breast: Secondary | ICD-10-CM | POA: Diagnosis not present

## 2023-03-16 ENCOUNTER — Other Ambulatory Visit: Payer: BC Managed Care – PPO

## 2023-03-18 ENCOUNTER — Ambulatory Visit (HOSPITAL_COMMUNITY): Payer: BC Managed Care – PPO | Attending: Cardiology

## 2023-03-18 DIAGNOSIS — I5022 Chronic systolic (congestive) heart failure: Secondary | ICD-10-CM | POA: Diagnosis present

## 2023-03-19 LAB — ECHOCARDIOGRAM COMPLETE: Area-P 1/2: 3.68 cm2

## 2023-07-14 ENCOUNTER — Other Ambulatory Visit: Payer: Self-pay | Admitting: Cardiology

## 2023-07-27 ENCOUNTER — Ambulatory Visit: Payer: Self-pay | Admitting: Cardiology

## 2023-08-08 ENCOUNTER — Other Ambulatory Visit: Payer: Self-pay | Admitting: Cardiology

## 2023-08-09 ENCOUNTER — Other Ambulatory Visit: Payer: Self-pay | Admitting: Cardiology

## 2023-09-07 ENCOUNTER — Other Ambulatory Visit: Payer: Self-pay | Admitting: Cardiology

## 2023-09-07 DIAGNOSIS — I5022 Chronic systolic (congestive) heart failure: Secondary | ICD-10-CM

## 2023-09-09 ENCOUNTER — Other Ambulatory Visit: Payer: Self-pay | Admitting: Cardiology

## 2023-09-14 ENCOUNTER — Ambulatory Visit: Payer: BC Managed Care – PPO | Attending: Cardiology | Admitting: Cardiology

## 2023-09-14 ENCOUNTER — Encounter: Payer: Self-pay | Admitting: Cardiology

## 2023-09-14 VITALS — BP 110/74 | HR 84 | Resp 16 | Ht 60.0 in | Wt 190.0 lb

## 2023-09-14 DIAGNOSIS — I428 Other cardiomyopathies: Secondary | ICD-10-CM

## 2023-09-14 DIAGNOSIS — I5022 Chronic systolic (congestive) heart failure: Secondary | ICD-10-CM | POA: Diagnosis not present

## 2023-09-14 MED ORDER — SPIRONOLACTONE 25 MG PO TABS: 25.0000 mg | ORAL_TABLET | Freq: Every day | ORAL | 3 refills | Status: AC

## 2023-09-14 MED ORDER — METOPROLOL SUCCINATE ER 50 MG PO TB24
50.0000 mg | ORAL_TABLET | Freq: Every day | ORAL | 3 refills | Status: AC
Start: 2023-09-14 — End: ?

## 2023-09-14 MED ORDER — DAPAGLIFLOZIN PROPANEDIOL 10 MG PO TABS: 10.0000 mg | ORAL_TABLET | Freq: Every day | ORAL | 3 refills | Status: DC

## 2023-09-14 MED ORDER — ENTRESTO 97-103 MG PO TABS: 1.0000 | ORAL_TABLET | Freq: Two times a day (BID) | ORAL | 3 refills | Status: AC

## 2023-09-14 NOTE — Progress Notes (Signed)
 Cardiology Office Note:  .   Date:  09/14/2023  ID:  Tina Colon, DOB 02/28/1981, MRN [id number] PCP: Jim Like, NP  [facility] Providers Cardiologist:  Truett Mainland, MD PCP: Jim Like, NP  Chief Complaint  Patient presents with   Chronic systolic heart failure   Follow-up     Tina Colon is a 43 y.o. female with nonischemic dilated cardiomyopathy w/recovered LVEF  Patient is doing well.  She is compliant with her medical therapy.  She denies any complaints of exertional dyspnea, leg edema.   Vitals:   09/14/23 1455  BP: 110/74  Pulse: 84  Resp: 16  SpO2: 98%      Review of Systems  Cardiovascular:  Negative for chest pain, dyspnea on exertion, leg swelling, palpitations and syncope.        Studies Reviewed: Marland Kitchen        EKG 09/14/2023: Normal sinus rhythm Normal ECG When compared with ECG of 02-Jun-2022 16:02, No significant change since    Independently interpreted 10/2022: Hb 14.2 Cr 0.71  Independently interpreted Echocardiogram 03/2023: EF 50-55%. Normal RV systolic function. No significant valvular abnormality.     Physical Exam Vitals and nursing note reviewed.  Constitutional:      General: She is not in acute distress. Neck:     Vascular: No JVD.  Cardiovascular:     Rate and Rhythm: Normal rate and regular rhythm.     Heart sounds: Normal heart sounds. No murmur heard. Pulmonary:     Effort: Pulmonary effort is normal.     Breath sounds: Normal breath sounds. No wheezing or rales.  Musculoskeletal:     Right lower leg: No edema.     Left lower leg: No edema.      VISIT DIAGNOSES:   ICD-10-CM   1. Nonischemic cardiomyopathy (HCC)  I42.8 EKG 12-Lead    Basic metabolic panel with GFR    Pro b natriuretic peptide (BNP)    CBC    2. Chronic systolic heart failure (HCC)  Z36.64 dapagliflozin propanediol (FARXIGA) 10 MG TABS tablet    metoprolol succinate (TOPROL-XL) 50 MG 24 hr tablet     sacubitril-valsartan (ENTRESTO) 97-103 MG       Tina Colon is a 43 y.o. female with nonischemic dilated cardiomyopathy w/recovered LVEF, IBS-D  Assessment & Plan  Nonischemic cardiomyopathy: Currently compensated.  She had relapse of HFrEF in 09/2018 (EF down to 15-20%), after near complete recovery on GDMT (Cardiac MRI EF 50% in 07/2018) LVEF recovered and stays normal (11/2021) Continue Entresto to 97-103 mg bid, metoprolol succinate 50 mg daily. spironolactone 25 mg daily, Farxiga 10 mg daily. Refilled meds for one year.  Check CBC, BMP, proBNP today.  Preoperative risk stratification: Patient is to undergo EGD and colonoscopy through her GI doctor Dr. Chales Abrahams soon.  No cardiac contraindications, low cardiac risk, okay to proceed.   Meds ordered this encounter  Medications   dapagliflozin propanediol (FARXIGA) 10 MG TABS tablet    Sig: Take 1 tablet (10 mg total) by mouth daily before breakfast.    Dispense:  90 tablet    Refill:  3   metoprolol succinate (TOPROL-XL) 50 MG 24 hr tablet    Sig: Take 1 tablet (50 mg total) by mouth daily. Take with or immediately following a meal.    Dispense:  90 tablet    Refill:  3   sacubitril-valsartan (ENTRESTO) 97-103 MG    Sig: Take 1 tablet by mouth 2 (  two) times daily.    Dispense:  180 tablet    Refill:  3    Pt must keep upcoming appt in April 2025 with Dr. Rosemary Holms before anymore refills. Thank you Final Attempt   spironolactone (ALDACTONE) 25 MG tablet    Sig: Take 1 tablet (25 mg total) by mouth daily.    Dispense:  90 tablet    Refill:  3     F/u in 1 year  Signed, Elder Negus, MD

## 2023-09-14 NOTE — Patient Instructions (Signed)
 Medication Instructions:  REFILLS SENT IN TODAY   STOP Lasix   *If you need a refill on your cardiac medications before your next appointment, please call your pharmacy*  Lab Work: BMP CBC PROBNP  If you have labs (blood work) drawn today and your tests are completely normal, you will receive your results only by: MyChart Message (if you have MyChart) OR A paper copy in the mail If you have any lab test that is abnormal or we need to change your treatment, we will call you to review the results.   Follow-Up: At [facility], you and your health needs are our priority.  As part of our continuing mission to provide you with exceptional heart care, our providers are all part of one team.  This team includes your primary Cardiologist (physician) and Advanced Practice Providers or APPs (Physician Assistants and Nurse Practitioners) who all work together to provide you with the care you need, when you need it.  Your next appointment:   1 year(s)  Provider:   Elder Negus, MD     We recommend signing up for the patient portal called "MyChart".  Sign up information is provided on this After Visit Summary.  MyChart is used to connect with patients for Virtual Visits (Telemedicine).  Patients are able to view lab/test results, encounter notes, upcoming appointments, etc.  Non-urgent messages can be sent to your provider as well.   To learn more about what you can do with MyChart, go to [email or web address].   Other Instructions      1st Floor: - Lobby - Registration  - Pharmacy  - Lab - Cafe  2nd Floor: - PV Lab - Diagnostic Testing (echo, CT, nuclear med)  3rd Floor: - Vacant  4th Floor: - TCTS (cardiothoracic surgery) - AFib Clinic - Structural Heart Clinic - Vascular Surgery  - Vascular Ultrasound  5th Floor: - HeartCare Cardiology (general and EP) - Clinical Pharmacy for coumadin, hypertension, lipid, weight-loss medications, and med management  appointments    Valet parking services will be available as well.

## 2023-09-15 ENCOUNTER — Encounter: Payer: Self-pay | Admitting: Cardiology

## 2023-09-15 LAB — CBC
Hematocrit: 40.7 % (ref 34.0–46.6)
Hemoglobin: 13.8 g/dL (ref 11.1–15.9)
MCH: 30.3 pg (ref 26.6–33.0)
MCHC: 33.9 g/dL (ref 31.5–35.7)
MCV: 90 fL (ref 79–97)
Platelets: 275 10*3/uL (ref 150–450)
RBC: 4.55 x10E6/uL (ref 3.77–5.28)
RDW: 12.4 % (ref 11.7–15.4)
WBC: 6.6 10*3/uL (ref 3.4–10.8)

## 2023-09-15 LAB — BASIC METABOLIC PANEL WITH GFR
BUN/Creatinine Ratio: 17 (ref 9–23)
BUN: 11 mg/dL (ref 6–24)
CO2: 20 mmol/L (ref 20–29)
Calcium: 9.6 mg/dL (ref 8.7–10.2)
Chloride: 101 mmol/L (ref 96–106)
Creatinine, Ser: 0.63 mg/dL (ref 0.57–1.00)
Glucose: 88 mg/dL (ref 70–99)
Potassium: 4.2 mmol/L (ref 3.5–5.2)
Sodium: 137 mmol/L (ref 134–144)
eGFR: 112 mL/min/{1.73_m2} (ref >59)

## 2023-09-15 LAB — PRO B NATRIURETIC PEPTIDE: NT-Pro BNP: 54 pg/mL (ref 0–130)

## 2023-09-19 ENCOUNTER — Emergency Department (HOSPITAL_BASED_OUTPATIENT_CLINIC_OR_DEPARTMENT_OTHER)

## 2023-09-19 ENCOUNTER — Encounter (HOSPITAL_BASED_OUTPATIENT_CLINIC_OR_DEPARTMENT_OTHER): Payer: Self-pay | Admitting: Emergency Medicine

## 2023-09-19 ENCOUNTER — Emergency Department (HOSPITAL_BASED_OUTPATIENT_CLINIC_OR_DEPARTMENT_OTHER): Admission: EM | Admit: 2023-09-19 | Discharge: 2023-09-19 | Disposition: A | Discharge status: Home / Self Care

## 2023-09-19 ENCOUNTER — Other Ambulatory Visit: Payer: Self-pay

## 2023-09-19 DIAGNOSIS — M79661 Pain in right lower leg: Secondary | ICD-10-CM | POA: Insufficient documentation

## 2023-09-19 DIAGNOSIS — M7989 Other specified soft tissue disorders: Secondary | ICD-10-CM | POA: Insufficient documentation

## 2023-09-19 DIAGNOSIS — Z79899 Other long term (current) drug therapy: Secondary | ICD-10-CM | POA: Insufficient documentation

## 2023-09-19 DIAGNOSIS — I509 Heart failure, unspecified: Secondary | ICD-10-CM | POA: Insufficient documentation

## 2023-09-19 LAB — CBC WITH DIFFERENTIAL/PLATELET
Abs Immature Granulocytes: 0.03 10*3/uL (ref 0.00–0.07)
Basophils Absolute: 0.1 10*3/uL (ref 0.0–0.1)
Basophils Relative: 1 %
Eosinophils Absolute: 0.2 10*3/uL (ref 0.0–0.5)
Eosinophils Relative: 2 %
HCT: 37.5 % (ref 36.0–46.0)
Hemoglobin: 12.7 g/dL (ref 12.0–15.0)
Immature Granulocytes: 0 %
Lymphocytes Relative: 31 %
Lymphs Abs: 3.1 10*3/uL (ref 0.7–4.0)
MCH: 30 pg (ref 26.0–34.0)
MCHC: 33.9 g/dL (ref 30.0–36.0)
MCV: 88.4 fL (ref 80.0–100.0)
Monocytes Absolute: 0.6 10*3/uL (ref 0.1–1.0)
Monocytes Relative: 6 %
Neutro Abs: 6.1 10*3/uL (ref 1.7–7.7)
Neutrophils Relative %: 60 %
Platelets: 275 10*3/uL (ref 150–400)
RBC: 4.24 MIL/uL (ref 3.87–5.11)
RDW: 13 % (ref 11.5–15.5)
WBC: 10 10*3/uL (ref 4.0–10.5)
nRBC: 0 % (ref 0.0–0.2)

## 2023-09-19 LAB — COMPREHENSIVE METABOLIC PANEL WITH GFR
ALT: 12 U/L (ref 0–44)
AST: 15 U/L (ref 15–41)
Albumin: 4.4 g/dL (ref 3.5–5.0)
Alkaline Phosphatase: 40 U/L (ref 38–126)
Anion gap: 9 (ref 5–15)
BUN: 13 mg/dL (ref 6–20)
CO2: 26 mmol/L (ref 22–32)
Calcium: 9.5 mg/dL (ref 8.9–10.3)
Chloride: 103 mmol/L (ref 98–111)
Creatinine, Ser: 0.72 mg/dL (ref 0.44–1.00)
GFR, Estimated: >60 mL/min (ref >60)
Glucose, Bld: 87 mg/dL (ref 70–99)
Potassium: 3.7 mmol/L (ref 3.5–5.1)
Sodium: 138 mmol/L (ref 135–145)
Total Bilirubin: 0.3 mg/dL (ref 0.0–1.2)
Total Protein: 7.4 g/dL (ref 6.5–8.1)

## 2023-09-19 LAB — HCG, QUANTITATIVE, PREGNANCY: hCG, Beta Chain, Quant, S: <1 m[IU]/mL (ref <5)

## 2023-09-19 LAB — BRAIN NATRIURETIC PEPTIDE: B Natriuretic Peptide: 29.8 pg/mL (ref 0.0–100.0)

## 2023-09-19 MED ORDER — DOXYCYCLINE HYCLATE 100 MG PO CAPS: 100.0000 mg | ORAL_CAPSULE | Freq: Two times a day (BID) | ORAL | 0 refills | Status: DC

## 2023-09-19 MED ORDER — PREDNISONE 20 MG PO TABS: 40.0000 mg | ORAL_TABLET | Freq: Every day | ORAL | 0 refills | Status: DC

## 2023-09-19 NOTE — Discharge Instructions (Addendum)
 You were seen in the ER today for evaluation of your leg pain.  Your DVT scan was negative.  This may be some inflammation or superficial infection of the skin.  For this, we are prescribing you 2 medications.  One is doxycycline which you will take twice daily for the next 7 days.  The other is called prednisone which will take once daily for the next 5 days.  Please make sure that you are observing your leg noting any changes such as change in color, change in size, change in temperature, weakness, change in sensation.  Of life you to follow-up with your primary care doctor within next few days for evaluation of this.  If you have any other concerns, new or worsening symptoms, please return to your nearest emergency department for reevaluation.  Contact a doctor if: You have a fever. You do not start to get better after 1-2 days of treatment. Your bone or joint under the infected area starts to hurt after the skin has healed. Your infection comes back in the same area or another area. Signs of this may include: You have a swollen bump in the area. Your red area gets larger, turns dark in color, or hurts more. You have more fluid coming from the wound. Pus or a bad smell develops in your infected area. You have more pain. You feel sick and have muscle aches and weakness. You develop vomiting or watery poop that will not go away. Get help right away if: You see red streaks coming from the area. You notice the skin turns purple or black and falls off. These symptoms may be an emergency. Get help right away. Call 911. Do not wait to see if the symptoms will go away. Do not drive yourself to the hospital.

## 2023-09-19 NOTE — ED Provider Notes (Signed)
 [place] EMERGENCY DEPARTMENT AT [facility] Provider Note   CSN: [id number] Arrival date & time: 09/19/23  1541     History {Add pertinent medical, surgical, social history, OB history to HPI:1} No chief complaint on file.   Tina Colon is a 43 y.o. female.  HPI     Home Medications Prior to Admission medications   Medication Sig Start Date End Date Taking? Authorizing Provider  doxycycline (VIBRAMYCIN) 100 MG capsule Take 1 capsule (100 mg total) by mouth 2 (two) times daily. 09/19/23  Yes Spence Dux, PA-C  predniSONE (DELTASONE) 20 MG tablet Take 2 tablets (40 mg total) by mouth daily. 09/19/23  Yes Spence Dux, PA-C  AMBULATORY NON FORMULARY MEDICATION Medication Name: Diltiazem 2%/Lidocaine 2%   Using your index finger apply a small amount of medication inside the anal opening and to the external anal area twice daily x 6 weeks. 12/16/21   Edmonia Gottron, PA-C  dapagliflozin propanediol (FARXIGA) 10 MG TABS tablet Take 1 tablet (10 mg total) by mouth daily before breakfast. 09/14/23   Patwardhan, Kaye Parsons, MD  MAGNESIUM-OXIDE 400 (240 Mg) MG tablet Take 1 tablet by mouth twice daily 09/09/23   Patwardhan, Manish J, MD  metoprolol succinate (TOPROL-XL) 50 MG 24 hr tablet Take 1 tablet (50 mg total) by mouth daily. Take with or immediately following a meal. 09/14/23   Patwardhan, Manish J, MD  pantoprazole (PROTONIX) 40 MG tablet Take 1 tablet (40 mg total) by mouth daily. 01/19/23   Lajuan Pila, MD  sacubitril-valsartan (ENTRESTO) 97-103 MG Take 1 tablet by mouth 2 (two) times daily. 09/14/23   Patwardhan, Kaye Parsons, MD  spironolactone (ALDACTONE) 25 MG tablet Take 1 tablet (25 mg total) by mouth daily. 09/14/23   Patwardhan, Manish J, MD  TRI-LO-MILI 0.18/0.215/0.25 MG-25 MCG tab Take 1 tablet by mouth daily. 10/16/19   [provider]      Allergies    Penicillins    Review of Systems   Review of Systems  Physical Exam Updated Vital Signs BP 126/80    Pulse 84   Temp 98.1 F (36.7 C)   Resp 16   SpO2 98%  Physical Exam  ED Results / Procedures / Treatments   Labs (all labs ordered are listed, but only abnormal results are displayed) Labs Reviewed  CBC WITH DIFFERENTIAL/PLATELET  COMPREHENSIVE METABOLIC PANEL WITH GFR  BRAIN NATRIURETIC PEPTIDE  HCG, QUANTITATIVE, PREGNANCY    EKG None  Radiology US  Venous Img Lower Unilateral Right Result Date: 09/19/2023 CLINICAL DATA:  Right foot pain, swelling EXAM: RIGHT LOWER EXTREMITY VENOUS DOPPLER ULTRASOUND TECHNIQUE: Gray-scale sonography with compression, as well as color and duplex ultrasound, were performed to evaluate the deep venous system(s) from the level of the common femoral vein through the popliteal and proximal calf veins. COMPARISON:  None Available. FINDINGS: VENOUS Normal compressibility of the common femoral, superficial femoral, and popliteal veins, as well as the visualized calf veins. Visualized portions of profunda femoral vein and great saphenous vein unremarkable. No filling defects to suggest DVT on grayscale or color Doppler imaging. Doppler waveforms show normal direction of venous flow, normal respiratory plasticity and response to augmentation. Limited views of the contralateral common femoral vein are unremarkable. OTHER None. Limitations: Limited views of the calf veins. IMPRESSION: Negative. Electronically Signed   By: Janeece Mechanic M.D.   On: 09/19/2023 20:07    Procedures Procedures  {Document cardiac monitor, telemetry assessment procedure when appropriate:1}  Medications Ordered in ED Medications - No  data to display  ED Course/ Medical Decision Making/ A&P   {   Click here for ABCD2, HEART and other calculatorsREFRESH Note before signing :1}                              Medical Decision Making Amount and/or Complexity of Data Reviewed Labs: ordered.  Risk Prescription drug management.   ***  {Document critical care time when  appropriate:1} {Document review of labs and clinical decision tools ie heart score, Chads2Vasc2 etc:1}  {Document your independent review of radiology images, and any outside records:1} {Document your discussion with family members, caretakers, and with consultants:1} {Document social determinants of health affecting pt's care:1} {Document your decision making why or why not admission, treatments were needed:1} Final Clinical Impression(s) / ED Diagnoses Final diagnoses:  Pain and swelling of lower leg, right    Rx / DC Orders ED Discharge Orders          Ordered    doxycycline (VIBRAMYCIN) 100 MG capsule  2 times daily        09/19/23 2133    predniSONE (DELTASONE) 20 MG tablet  Daily        09/19/23 2133

## 2023-09-19 NOTE — ED Triage Notes (Signed)
 Right foot swollen and painful starting Monday and has progressed to her shin/calf .

## 2023-09-22 ENCOUNTER — Encounter: Payer: Self-pay | Admitting: Gastroenterology

## 2023-10-13 ENCOUNTER — Other Ambulatory Visit: Payer: Self-pay | Admitting: Cardiology

## 2023-12-11 ENCOUNTER — Other Ambulatory Visit: Payer: Self-pay | Admitting: Cardiology

## 2024-03-09 ENCOUNTER — Telehealth: Payer: Self-pay | Admitting: Cardiology

## 2024-03-09 ENCOUNTER — Encounter (HOSPITAL_COMMUNITY): Payer: Self-pay | Admitting: *Deleted

## 2024-03-09 ENCOUNTER — Observation Stay (HOSPITAL_COMMUNITY)
Admission: EM | Admit: 2024-03-09 | Discharge: 2024-03-10 | Disposition: A | Discharge status: Home / Self Care | Attending: Student | Admitting: Student

## 2024-03-09 ENCOUNTER — Emergency Department (HOSPITAL_COMMUNITY)

## 2024-03-09 ENCOUNTER — Other Ambulatory Visit: Payer: Self-pay

## 2024-03-09 DIAGNOSIS — Z7901 Long term (current) use of anticoagulants: Secondary | ICD-10-CM | POA: Diagnosis not present

## 2024-03-09 DIAGNOSIS — R0789 Other chest pain: Secondary | ICD-10-CM | POA: Diagnosis present

## 2024-03-09 DIAGNOSIS — Z79899 Other long term (current) drug therapy: Secondary | ICD-10-CM | POA: Diagnosis not present

## 2024-03-09 DIAGNOSIS — R0609 Other forms of dyspnea: Principal | ICD-10-CM | POA: Insufficient documentation

## 2024-03-09 DIAGNOSIS — R079 Chest pain, unspecified: Principal | ICD-10-CM | POA: Insufficient documentation

## 2024-03-09 DIAGNOSIS — Z1152 Encounter for screening for COVID-19: Secondary | ICD-10-CM | POA: Insufficient documentation

## 2024-03-09 DIAGNOSIS — Z7982 Long term (current) use of aspirin: Secondary | ICD-10-CM | POA: Diagnosis not present

## 2024-03-09 DIAGNOSIS — R072 Precordial pain: Secondary | ICD-10-CM

## 2024-03-09 DIAGNOSIS — I5022 Chronic systolic (congestive) heart failure: Secondary | ICD-10-CM

## 2024-03-09 DIAGNOSIS — Z8679 Personal history of other diseases of the circulatory system: Secondary | ICD-10-CM | POA: Diagnosis not present

## 2024-03-09 DIAGNOSIS — E876 Hypokalemia: Secondary | ICD-10-CM | POA: Insufficient documentation

## 2024-03-09 DIAGNOSIS — F1290 Cannabis use, unspecified, uncomplicated: Secondary | ICD-10-CM | POA: Insufficient documentation

## 2024-03-09 DIAGNOSIS — M79671 Pain in right foot: Secondary | ICD-10-CM | POA: Insufficient documentation

## 2024-03-09 DIAGNOSIS — Z87891 Personal history of nicotine dependence: Secondary | ICD-10-CM | POA: Insufficient documentation

## 2024-03-09 LAB — TROPONIN T, HIGH SENSITIVITY
Troponin T High Sensitivity: <15 ng/L (ref 0–19)
Troponin T High Sensitivity: <15 ng/L (ref 0–19)

## 2024-03-09 LAB — RESP PANEL BY RT-PCR (RSV, FLU A&B, COVID)  RVPGX2
Influenza A by PCR: NEGATIVE
Influenza B by PCR: NEGATIVE
Resp Syncytial Virus by PCR: NEGATIVE
SARS Coronavirus 2 by RT PCR: NEGATIVE

## 2024-03-09 LAB — CBC
HCT: 37.6 % (ref 36.0–46.0)
Hemoglobin: 12.7 g/dL (ref 12.0–15.0)
MCH: 29.6 pg (ref 26.0–34.0)
MCHC: 33.8 g/dL (ref 30.0–36.0)
MCV: 87.6 fL (ref 80.0–100.0)
Platelets: 254 K/uL (ref 150–400)
RBC: 4.29 MIL/uL (ref 3.87–5.11)
RDW: 13 % (ref 11.5–15.5)
WBC: 6 K/uL (ref 4.0–10.5)
nRBC: 0 % (ref 0.0–0.2)

## 2024-03-09 LAB — PRO BRAIN NATRIURETIC PEPTIDE: Pro Brain Natriuretic Peptide: <50 pg/mL (ref <300.0)

## 2024-03-09 LAB — BASIC METABOLIC PANEL WITH GFR
Anion gap: 14 (ref 5–15)
BUN: 10 mg/dL (ref 6–20)
CO2: 20 mmol/L — ABNORMAL LOW (ref 22–32)
Calcium: 9.7 mg/dL (ref 8.9–10.3)
Chloride: 103 mmol/L (ref 98–111)
Creatinine, Ser: 0.64 mg/dL (ref 0.44–1.00)
GFR, Estimated: >60 mL/min (ref >60)
Glucose, Bld: 110 mg/dL — ABNORMAL HIGH (ref 70–99)
Potassium: 3.2 mmol/L — ABNORMAL LOW (ref 3.5–5.1)
Sodium: 137 mmol/L (ref 135–145)

## 2024-03-09 LAB — HEPATIC FUNCTION PANEL
ALT: 14 U/L (ref 0–44)
AST: 20 U/L (ref 15–41)
Albumin: 4.3 g/dL (ref 3.5–5.0)
Alkaline Phosphatase: 43 U/L (ref 38–126)
Bilirubin, Direct: 0.1 mg/dL (ref 0.0–0.2)
Indirect Bilirubin: 0.2 mg/dL — ABNORMAL LOW (ref 0.3–0.9)
Total Bilirubin: 0.3 mg/dL (ref 0.0–1.2)
Total Protein: 7.1 g/dL (ref 6.5–8.1)

## 2024-03-09 LAB — LIPASE, BLOOD: Lipase: 35 U/L (ref 11–51)

## 2024-03-09 LAB — D-DIMER, QUANTITATIVE: D-Dimer, Quant: 0.39 ug{FEU}/mL (ref 0.00–0.50)

## 2024-03-09 LAB — MAGNESIUM: Magnesium: 2 mg/dL (ref 1.7–2.4)

## 2024-03-09 LAB — HCG, SERUM, QUALITATIVE: Preg, Serum: NEGATIVE

## 2024-03-09 MED ORDER — DAPAGLIFLOZIN PROPANEDIOL 10 MG PO TABS
10.0000 mg | ORAL_TABLET | Freq: Every day | ORAL | Status: DC
  Filled 2024-03-09 (×2): qty 1

## 2024-03-09 MED ORDER — ASPIRIN 81 MG PO TBEC
81.0000 mg | DELAYED_RELEASE_TABLET | Freq: Every day | ORAL | Status: DC
  Administered 2024-03-09 – 2024-03-10 (×2): 81 mg via ORAL
  Filled 2024-03-09 (×2): qty 1

## 2024-03-09 MED ORDER — ACETAMINOPHEN 325 MG PO TABS: 650.0000 mg | ORAL_TABLET | ORAL | Status: DC | PRN

## 2024-03-09 MED ORDER — METOPROLOL SUCCINATE ER 50 MG PO TB24
50.0000 mg | ORAL_TABLET | Freq: Every day | ORAL | Status: DC
  Administered 2024-03-10: 50 mg via ORAL
  Filled 2024-03-09: qty 1

## 2024-03-09 MED ORDER — SACUBITRIL-VALSARTAN 97-103 MG PO TABS
1.0000 | ORAL_TABLET | Freq: Two times a day (BID) | ORAL | Status: DC
  Administered 2024-03-09 – 2024-03-10 (×2): 1 via ORAL
  Filled 2024-03-09 (×3): qty 1

## 2024-03-09 MED ORDER — SPIRONOLACTONE 25 MG PO TABS
25.0000 mg | ORAL_TABLET | Freq: Every day | ORAL | Status: DC
  Administered 2024-03-10: 25 mg via ORAL
  Filled 2024-03-09: qty 1

## 2024-03-09 MED ORDER — POTASSIUM CHLORIDE CRYS ER 20 MEQ PO TBCR
40.0000 meq | EXTENDED_RELEASE_TABLET | Freq: Once | ORAL | Status: AC
  Administered 2024-03-09: 40 meq via ORAL
  Filled 2024-03-09: qty 2

## 2024-03-09 MED ORDER — PANTOPRAZOLE SODIUM 40 MG PO TBEC
40.0000 mg | DELAYED_RELEASE_TABLET | Freq: Every day | ORAL | Status: DC
Start: 2024-03-10 — End: 2024-03-10
  Administered 2024-03-10: 40 mg via ORAL
  Filled 2024-03-09: qty 1

## 2024-03-09 MED ORDER — ENOXAPARIN SODIUM 40 MG/0.4ML IJ SOSY
40.0000 mg | PREFILLED_SYRINGE | INTRAMUSCULAR | Status: DC
  Administered 2024-03-09: 40 mg via SUBCUTANEOUS
  Filled 2024-03-09: qty 0.4

## 2024-03-09 NOTE — Telephone Encounter (Signed)
 Spoke with pt.  DOE and pressure.  Last bp was on Saturday 116/83 HR 81 112/85 HR92 124/91 HR86 SOB and chest pain. Pt is current having chest pain moving into back and SOB. Pt did sound SOB over phone. She does not have a way to check BP or HR at this time. Advised pt to go to ed or call 911. Pt stated understanding.

## 2024-03-09 NOTE — Telephone Encounter (Signed)
 Pt c/o Shortness Of Breath: STAT if SOB developed within the last 24 hours or pt is noticeably SOB on the phone  1. Are you currently SOB (can you hear that pt is SOB on the phone)? Yes   2. How long have you been experiencing SOB? Since sat but this morning has been worse  3. Are you SOB when sitting or when up moving around? Up moving around  4. Are you currently experiencing any other symptoms? Chest pressure

## 2024-03-09 NOTE — ED Provider Notes (Signed)
 Care assumed from Dr. Jerrol.  At time of transfer of care, we are waiting on cardiology consultation to ensure safe follow-up plan given the negative troponin and otherwise reassuring workup today in the setting of some exertional chest discomfort and shortness of breath with her cardiac history.  Anticipate discharge home after discussion with cardiology.  4:07 PM Cardiology spoke to previous team and they recommended admission to medicine for further cardiac workup including an echo and she is okay to be admitted here at [facility].  Cardiology will come see her in the hospital by report.  Medicine will admit.    Clinical Impression: 1. Dyspnea on exertion   2. Chest pain, unspecified type     Disposition: Admit  This note was prepared with assistance of Dragon voice recognition software. Occasional wrong-word or sound-a-like substitutions may have occurred due to the inherent limitations of voice recognition software.     Kuba Shepherd, Lonni PARAS, MD 03/09/24 [phone]

## 2024-03-09 NOTE — H&P (Signed)
 History and Physical    Tina Colon FMW:[id number] DOB: [birth year] DOA: 03/09/2024  PCP: Alec House, MD  Patient coming from: Home  I have personally briefly reviewed patient's old medical records available.   Chief Complaint: Chest discomfort and exertional dyspnea for the last 3 days  HPI: Tina Colon is a 43 y.o. female with medical history significant of nonischemic cardiomyopathy, known ejection fraction 50 to 55%, followed by cardiology presents to the ER with shortness of breath and chest pressure sensation.  Patient denies any recent exacerbations.  First episode was left-sided chest discomfort and heavy sensation along with shortness of breath with mobility at work.  2nd and 3rd day she continued to have short lasting discomfort while moving around at work.  Denies real chest pain.  Denies any orthopnea, denies any PND.  Denies any weight gain.  Compliant to Entresto  and metoprolol .  Denies any URI symptoms, flulike symptoms.  Denies any nausea vomiting or abdominal pain.  Bowel and urine habits are normal.  Denies any dizziness or lightheadedness or syncopal episodes.  ED Course: Hemodynamically stable.  Blood pressures are normal.  Electrolytes are adequate with potassium of 3.2.  Renal functions are adequate.  Chest x-ray is essentially normal.  Initial EKG nonischemic.  Troponins nonischemic.  Advised admission to rule out.  ER discussed with cardiology, Dr. Anner who was okay with patient staying either at [facility] or [facility].    Review of Systems: all systems are reviewed and pertinent positive as per HPI otherwise rest are negative.  Patient complains of right great toe and foot pain which is mild in intensity without radiation.  No swelling or erythema.  No joint swelling.   Past Medical History:  Diagnosis Date   Cardiomyopathy [facility])    CHF (congestive heart failure) (HCC)    ETOH abuse    Gastritis    GERD (gastroesophageal reflux disease)     Heart failure (HCC)    Pancreatitis     Past Surgical History:  Procedure Laterality Date   ESOPHAGOGASTRODUODENOSCOPY  06/19/2015    Dr Shila   RIGHT/LEFT HEART CATH AND CORONARY ANGIOGRAPHY N/A 09/26/2019   Procedure: RIGHT/LEFT HEART CATH AND CORONARY ANGIOGRAPHY;  Surgeon: Elmira Newman PARAS, MD;  Location: [facility] INVASIVE CV LAB;  Service: Cardiovascular;  Laterality: N/A;   WISDOM TOOTH EXTRACTION      Social history   reports that she quit smoking about 14 years ago. Her smoking use included cigarettes. She started smoking about 24 years ago. She has a 5 pack-year smoking history. She has never used smokeless tobacco. She reports that she does not currently use alcohol. She reports current drug use. Drug: Marijuana.  Allergies  Allergen Reactions   Penicillins Anaphylaxis    Has patient had a PCN reaction causing immediate rash, facial/tongue/throat swelling, SOB or lightheadedness with hypotension:Yes Has patient had a PCN reaction causing severe rash involving mucus membranes or skin necrosis:unknown Has patient had a PCN reaction that required hospitalization:Yes Has patient had a PCN reaction occurring within the last 10 years:No If all of the above answers are NO, then may proceed with Cephalosporin use.     Family History  Problem Relation Age of Onset   Diabetes Mother    Hyperlipidemia Mother    Hypertension Father    Gout Father    Gallstones Sister        Had her gallbladder removed    Colon polyps Sister    Stomach cancer Maternal Grandfather  [place] that his siblings died with cancer also but not sure what kind   Gallstones Paternal Aunt        said a few on her dad's side    Diabetes Other    Heart disease Other    Irritable bowel syndrome Other      Prior to Admission medications   Medication Sig Start Date End Date Taking? Authorizing Provider  AMBULATORY NON FORMULARY MEDICATION Medication Name: Diltiazem 2%/Lidocaine  2%   Using your  index finger apply a small amount of medication inside the anal opening and to the external anal area twice daily x 6 weeks. 12/16/21   Craig Alan SAUNDERS, PA-C  dapagliflozin  propanediol (FARXIGA ) 10 MG TABS tablet Take 1 tablet (10 mg total) by mouth daily before breakfast. 09/14/23   Patwardhan, Newman PARAS, MD  doxycycline  (VIBRAMYCIN ) 100 MG capsule Take 1 capsule (100 mg total) by mouth 2 (two) times daily. 09/19/23   Bernis Ernst, PA-C  MAGnesium -Oxide 400 (240 Mg) MG tablet Take 1 tablet (400 mg total) by mouth 2 (two) times daily. 12/14/23   Patwardhan, Newman PARAS, MD  metoprolol  succinate (TOPROL -XL) 50 MG 24 hr tablet Take 1 tablet (50 mg total) by mouth daily. Take with or immediately following a meal. 09/14/23   Patwardhan, Manish J, MD  pantoprazole  (PROTONIX ) 40 MG tablet Take 1 tablet (40 mg total) by mouth daily. 01/19/23   Charlanne Groom, MD  predniSONE  (DELTASONE ) 20 MG tablet Take 2 tablets (40 mg total) by mouth daily. 09/19/23   Bernis Ernst, PA-C  sacubitril -valsartan  (ENTRESTO ) 97-103 MG Take 1 tablet by mouth 2 (two) times daily. 09/14/23   Patwardhan, Newman PARAS, MD  spironolactone  (ALDACTONE ) 25 MG tablet Take 1 tablet (25 mg total) by mouth daily. 09/14/23   Patwardhan, Newman PARAS, MD  TRI-LO-MILI  0.18/0.215/0.25 MG-25 MCG tab Take 1 tablet by mouth daily. 10/16/19   [provider]    Physical Exam: Vitals:   03/09/24 1057 03/09/24 1058 03/09/24 1500 03/09/24 1509  BP: (!) 145/103 (!) 145/103 116/73   Pulse: 93 86 82   Resp: 19 18 16    Temp: 98.6 F (37 C) 98.6 F (37 C)  98 F (36.7 C)  TempSrc: Oral Oral  Oral  SpO2: 96% 97% 100%   Weight:      Height:        Constitutional: NAD, calm, comfortable Vitals:   03/09/24 1057 03/09/24 1058 03/09/24 1500 03/09/24 1509  BP: (!) 145/103 (!) 145/103 116/73   Pulse: 93 86 82   Resp: 19 18 16    Temp: 98.6 F (37 C) 98.6 F (37 C)  98 F (36.7 C)  TempSrc: Oral Oral  Oral  SpO2: 96% 97% 100%   Weight:      Height:        Eyes: PERRL, lids and conjunctivae normal ENMT: Mucous membranes are moist. Posterior pharynx clear of any exudate or lesions.Normal dentition.  Neck: normal, supple, no masses, no thyromegaly Respiratory: clear to auscultation bilaterally, no wheezing, no crackles. Normal respiratory effort. No accessory muscle use.  Cardiovascular: Regular rate and rhythm, no murmurs / rubs / gallops. No extremity edema. 2+ pedal pulses. No carotid bruits.  No reproducible chest pain. Abdomen: no tenderness, no masses palpated. No hepatosplenomegaly. Bowel sounds positive.  Musculoskeletal: no clubbing / cyanosis. No joint deformity upper and lower extremities. Good ROM, no contractures. Normal muscle tone.  Skin: no rashes, lesions, ulcers. No induration Neurologic: CN 2-12 grossly intact. Sensation intact, DTR normal. Strength  5/5 in all 4.  Psychiatric: Normal judgment and insight. Alert and oriented x 3. Normal mood.     Labs on Admission: I have personally reviewed following labs and imaging studies  CBC: Recent Labs  Lab 03/09/24 1100  WBC 6.0  HGB 12.7  HCT 37.6  MCV 87.6  PLT 254   Basic Metabolic Panel: Recent Labs  Lab 03/09/24 1100  NA 137  K 3.2*  CL 103  CO2 20*  GLUCOSE 110*  BUN 10  CREATININE 0.64  CALCIUM  9.7  MG 2.0   GFR: Estimated Creatinine Clearance: 85.4 mL/min (by C-G formula based on SCr of 0.64 mg/dL). Liver Function Tests: Recent Labs  Lab 03/09/24 1100  AST 20  ALT 14  ALKPHOS 43  BILITOT 0.3  PROT 7.1  ALBUMIN 4.3   Recent Labs  Lab 03/09/24 1116  LIPASE 35   No results for input(s): AMMONIA in the last 168 hours. Coagulation Profile: No results for input(s): INR, PROTIME in the last 168 hours. Cardiac Enzymes: No results for input(s): CKTOTAL, CKMB, CKMBINDEX, TROPONINI in the last 168 hours. BNP (last 3 results) Recent Labs    09/14/23 1544 03/09/24 1100  PROBNP 54 <50.0   HbA1C: No results for input(s): HGBA1C  in the last 72 hours. CBG: No results for input(s): GLUCAP in the last 168 hours. Lipid Profile: No results for input(s): CHOL, HDL, LDLCALC, TRIG, CHOLHDL, LDLDIRECT in the last 72 hours. Thyroid Function Tests: No results for input(s): TSH, T4TOTAL, FREET4, T3FREE, THYROIDAB in the last 72 hours. Anemia Panel: No results for input(s): VITAMINB12, FOLATE, FERRITIN, TIBC, IRON, RETICCTPCT in the last 72 hours. Urine analysis:    Component Value Date/Time   COLORURINE YELLOW 06/02/2022 1509   APPEARANCEUR CLEAR 06/02/2022 1509   LABSPEC 1.020 06/02/2022 1509   PHURINE 5.5 06/02/2022 1509   GLUCOSEU NEGATIVE 06/02/2022 1509   HGBUR TRACE (A) 06/02/2022 1509   BILIRUBINUR SMALL (A) 06/02/2022 1509   BILIRUBINUR neg 11/29/2013 1015   KETONESUR 40 (A) 06/02/2022 1509   PROTEINUR >=300 (A) 06/02/2022 1509   UROBILINOGEN 0.2 11/05/2014 1335   NITRITE NEGATIVE 06/02/2022 1509   LEUKOCYTESUR NEGATIVE 06/02/2022 1509    Radiological Exams on Admission: DG Chest 2 View Result Date: 03/09/2024 CLINICAL DATA:  Shortness of breath and chest pressure. EXAM: CHEST - 2 VIEW COMPARISON:  04/17/2022 FINDINGS: Lungs are hypoinflated and otherwise clear. Cardiomediastinal silhouette and remainder of the exam is unchanged. IMPRESSION: Hypoinflation without acute cardiopulmonary disease. Electronically Signed   By: Toribio Agreste M.D.   On: 03/09/2024 11:48    EKG: Independently reviewed.  Normal sinus rhythm.  Low voltage EKG.  No acute ischemic changes.  Assessment/Plan Principal Problem:   Chest pain     1.  Chest pain: Probably atypical.  Rule out acute coronary syndrome.  Observation in telemetry unit given presentation. Currently chest pain improved.  Cycle EKG and troponins.  So far negative. Supplemental oxygen to keep saturations more than 90%. Aspirin , will start patient on aspirin  81 mg daily. Nitroglycerin , if recurrent pain. Morphine  for severe  and recurrent pain. Therapeutic anticoagulation not indicated because of pain resolved. 2D echocardiogram ordered.  Cardiology to consult.  Probably outpatient stress test.  2.  History of nonischemic cardiomyopathy: proBNP is normal.  Does not have evidence of congestive heart failure.  Repeat echocardiogram.  Patient to continue on Entresto , Farxiga , metoprolol  and spironolactone .  3.  Hypokalemia: Replace and monitor levels.  4.  Right foot pain: No evidence of ischemia  or joint swelling.  Probably musculoskeletal pain.   DVT prophylaxis: Lovenox  subcu Code Status: Full code Family Communication: None at the bedside. Disposition Plan: Home Consults called: Cardiology, called by ER Admission status: Observation.  Cardiac telemetry   Renato Applebaum MD [facility]

## 2024-03-09 NOTE — ED Provider Notes (Addendum)
 [place] [facility]GENC[facility] DE[facility]MENT AT [facility] Provider Note   CSN: [id number] Arrival date & time: 03/09/24  1048     Patient presents with: Shortness of Breath and Chest Pain   Tina Colon is a 43 y.o. female.    Shortness of Breath Associated symptoms: chest pain   Chest Pain Associated symptoms: shortness of breath      43 year old female with medical history significant for nonischemic dilated cardiomyopathy with recovered EF follows outpatient with cardiology at [facility] who presents to the emergency department with shortness of breath and chest pressure.  Has had dyspnea on exertion starting on Saturday while at work and has had intermittent chest pressure and dyspnea particular when ambulating.  She called her cardiologist who instructed her to present to the emergency department for further evaluation.  She denies any lower extremity swelling.  She denies any orthopnea.  She denies any sharp pleuritic chest discomfort.  She has been compliant with her Entresto .  She is not on anticoagulation.  No cough fevers or chills.  She endorses a heavy tightness as if something is sitting on her chest sensation that has been intermittent, worse on exertion.  Her last echocardiogram in October 2024 which revealed low normal LVEF 50 to 55%.  Prior to Admission medications   Medication Sig Start Date End Date Taking? Authorizing Provider  AMBULATORY NON FORMULARY MEDICATION Medication Name: Diltiazem 2%/Lidocaine  2%   Using your index finger apply a small amount of medication inside the anal opening and to the external anal area twice daily x 6 weeks. 12/16/21   Craig Alan SAUNDERS, PA-C  dapagliflozin  propanediol (FARXIGA ) 10 MG TABS tablet Take 1 tablet (10 mg total) by mouth daily before breakfast. 09/14/23   Patwardhan, Newman PARAS, MD  doxycycline  (VIBRAMYCIN ) 100 MG capsule Take 1 capsule (100 mg total) by mouth 2 (two) times daily. 09/19/23   Bernis Ernst, PA-C  MAGnesium -Oxide 400  (240 Mg) MG tablet Take 1 tablet (400 mg total) by mouth 2 (two) times daily. 12/14/23   Patwardhan, Newman PARAS, MD  metoprolol  succinate (TOPROL -XL) 50 MG 24 hr tablet Take 1 tablet (50 mg total) by mouth daily. Take with or immediately following a meal. 09/14/23   Patwardhan, Manish J, MD  pantoprazole  (PROTONIX ) 40 MG tablet Take 1 tablet (40 mg total) by mouth daily. 01/19/23   Charlanne Groom, MD  predniSONE  (DELTASONE ) 20 MG tablet Take 2 tablets (40 mg total) by mouth daily. 09/19/23   Bernis Ernst, PA-C  sacubitril -valsartan  (ENTRESTO ) 97-103 MG Take 1 tablet by mouth 2 (two) times daily. 09/14/23   Patwardhan, Newman PARAS, MD  spironolactone  (ALDACTONE ) 25 MG tablet Take 1 tablet (25 mg total) by mouth daily. 09/14/23   Patwardhan, Newman PARAS, MD  TRI-LO-MILI  0.18/0.215/0.25 MG-25 MCG tab Take 1 tablet by mouth daily. 10/16/19   [provider]    Allergies: Penicillins    Review of Systems  Respiratory:  Positive for shortness of breath.   Cardiovascular:  Positive for chest pain.  All other systems reviewed and are negative.   Updated Vital Signs BP 116/73 (BP Location: Right Arm)   Pulse 82   Temp 98 F (36.7 C) (Oral)   Resp 16   Ht 5' (1.524 m)   Wt 83.9 kg   LMP 02/15/2024 (Exact Date)   SpO2 100%   BMI 36.13 kg/m   Physical Exam Vitals and nursing note reviewed.  Constitutional:      General: She is not in  acute distress.    Appearance: She is well-developed.  HENT:     Head: Normocephalic and atraumatic.  Eyes:     Conjunctiva/sclera: Conjunctivae normal.  Cardiovascular:     Rate and Rhythm: Normal rate and regular rhythm.     Pulses: Normal pulses.     Heart sounds: No murmur heard. Pulmonary:     Effort: Pulmonary effort is normal. No respiratory distress.     Breath sounds: Normal breath sounds. No rales.  Abdominal:     Palpations: Abdomen is soft.     Tenderness: There is no abdominal tenderness.  Musculoskeletal:        General: No swelling.      Cervical back: Neck supple.     Right lower leg: No edema.     Left lower leg: No edema.  Skin:    General: Skin is warm and dry.     Capillary Refill: Capillary refill takes less than 2 seconds.  Neurological:     Mental Status: She is alert.  Psychiatric:        Mood and Affect: Mood normal.     (all labs ordered are listed, but only abnormal results are displayed) Labs Reviewed  BASIC METABOLIC PANEL WITH GFR - Abnormal; Notable for the following components:      Result Value   Potassium 3.2 (*)    CO2 20 (*)    Glucose, Bld 110 (*)    All other components within normal limits  HEPATIC FUNCTION PANEL - Abnormal; Notable for the following components:   Indirect Bilirubin 0.2 (*)    All other components within normal limits  RESP PANEL BY RT-PCR (RSV, FLU A&B, COVID)  RVPGX2  CBC  HCG, SERUM, QUALITATIVE  PRO BRAIN NATRIURETIC PEPTIDE  LIPASE, BLOOD  D-DIMER, QUANTITATIVE  MAGNESIUM   TROPONIN T, HIGH SENSITIVITY  TROPONIN T, HIGH SENSITIVITY    EKG: EKG Interpretation Date/Time:  Wednesday March 09 2024 10:57:12 EDT Ventricular Rate:  89 PR Interval:  156 QRS Duration:  101 QT Interval:  373 QTC Calculation: 454 R Axis:   -18  Text Interpretation: Sinus rhythm Borderline left axis deviation Low voltage, precordial leads Consider anterior infarct Confirmed by Jerrol Agent ([id number]) on 03/09/2024 11:14:13 AM  Radiology: [facility] Chest 2 View Result Date: 03/09/2024 CLINICAL DATA:  Shortness of breath and chest pressure. EXAM: CHEST - 2 VIEW COMPARISON:  04/17/2022 FINDINGS: Lungs are hypoinflated and otherwise clear. Cardiomediastinal silhouette and remainder of the exam is unchanged. IMPRESSION: Hypoinflation without acute cardiopulmonary disease. Electronically Signed   By: Toribio Agreste M.D.   On: 03/09/2024 11:48     Procedures   Medications Ordered in the ED  potassium chloride  SA (KLOR-CON  M) CR tablet 40 mEq (40 mEq Oral Given 03/09/24 1309)                                     Medical Decision Making Amount and/or Complexity of Data Reviewed Labs: ordered. Radiology: ordered.  Risk Prescription drug management.     69 female with medical history significant for nonischemic dilated cardiomyopathy with recovered EF follows outpatient with cardiology at [facility] who presents to the emergency department with shortness of breath and chest pressure.  Has had dyspnea on exertion starting on Saturday while at work and has had intermittent chest pressure and dyspnea particular when ambulating.  She called her cardiologist who instructed her to present to the emergency department  for further evaluation.  She denies any lower extremity swelling.  She denies any orthopnea.  She denies any sharp pleuritic chest discomfort.  She has been compliant with her Entresto .  She is not on anticoagulation.  No cough fevers or chills.  She endorses a heavy tightness as if something is sitting on her chest sensation that has been intermittent, worse on exertion.  Her last echocardiogram in October 2024 which revealed low normal LVEF 50 to 55%.  On arrival, the patient was afebrile, not tachycardic or tachypneic, hypertensive BP 145/103, improved to 116/73 without intervention, saturating 96% on room air.  On exam the patient had a normal exam.  She has no neurologic deficits, denies any ripping tearing chest pain radiating to the back, PERC positive on estrogen-containing birth control pills, Wells low risk, will obtain D-dimer to evaluate for PE.  Low concern for aortic dissection, chest pain not ripping or tearing, intact symmetric pulses.  Considered ACS in the setting of the patient's presentation.  Considered viral infection, pneumothorax, pneumonia, unstable angina.  EKG: Sinus rhythm, reticular rate 89, no acute changes  CXR: IMPRESSION:  Hypoinflation without acute cardiopulmonary disease.   Labs: hCG negative, D-dimer normal, magnesium  normal, hepatic function panel  normal, lipase normal, CBC normal, BMP with mild hypokalemia, replenished orally, BNP normal, COVID flu and RSV PCR testing normal.  Cardiac troponins pending at time of signout, plan to follow-up results of troponin testing, consider engagement with cardiology as the patient is still symptomatic pending results of troponins. Initial troponin normal, given duration of symptoms do not think repeat cardiac troponin is indicated.  Cardiology consulted given intermittent persistent dyspnea and occasional chest pressure over the past few days that has been increasing which raises concern for potential unstable angina as other etiologies such as pulmonary hypertension.   Discussed with Dr. Anner who recommended observation admission for consideration for echocardiogram and evaluation by cardiology.  Patient okay for admission to [facility].  Signout given to Dr. Tegeler to admit the patient to the hospitalist service.      Final diagnoses:  Dyspnea on exertion  Chest pain, unspecified type    ED Discharge Orders     None          Jerrol Agent, MD 03/09/24 1540    Jerrol Agent, MD 03/09/24 [phone]

## 2024-03-09 NOTE — ED Triage Notes (Addendum)
 Pt presents with [facility] and chest pressure on exertion  starting on Sat while at work, due to history of CHF cardiology instructed her to come to the ED.

## 2024-03-10 ENCOUNTER — Other Ambulatory Visit: Payer: Self-pay | Admitting: Student

## 2024-03-10 ENCOUNTER — Observation Stay (HOSPITAL_COMMUNITY)

## 2024-03-10 DIAGNOSIS — R079 Chest pain, unspecified: Secondary | ICD-10-CM

## 2024-03-10 DIAGNOSIS — R0789 Other chest pain: Secondary | ICD-10-CM

## 2024-03-10 DIAGNOSIS — R0609 Other forms of dyspnea: Secondary | ICD-10-CM | POA: Diagnosis not present

## 2024-03-10 LAB — CBC
HCT: 37.5 % (ref 36.0–46.0)
Hemoglobin: 12.4 g/dL (ref 12.0–15.0)
MCH: 30 pg (ref 26.0–34.0)
MCHC: 33.1 g/dL (ref 30.0–36.0)
MCV: 90.6 fL (ref 80.0–100.0)
Platelets: 236 K/uL (ref 150–400)
RBC: 4.14 MIL/uL (ref 3.87–5.11)
RDW: 12.9 % (ref 11.5–15.5)
WBC: 5 K/uL (ref 4.0–10.5)
nRBC: 0 % (ref 0.0–0.2)

## 2024-03-10 LAB — HIV ANTIBODY (ROUTINE TESTING W REFLEX): HIV Screen 4th Generation wRfx: NONREACTIVE

## 2024-03-10 LAB — ECHOCARDIOGRAM COMPLETE
AR max vel: 2.42 cm2
AV Area VTI: 2.39 cm2
AV Area mean vel: 2.28 cm2
AV Mean grad: 3 mmHg
AV Peak grad: 6.1 mmHg
Ao pk vel: 1.23 m/s
Area-P 1/2: 3.66 cm2
Calc EF: 52.9 %
Height: 60 in
MV M vel: 4.04 m/s
MV Peak grad: 65.3 mmHg
MV VTI: 2.62 cm2
S' Lateral: 3.1 cm
Single Plane A2C EF: 52.3 %
Single Plane A4C EF: 51 %
Weight: 2960 [oz_av]

## 2024-03-10 LAB — BASIC METABOLIC PANEL WITH GFR
Anion gap: 12 (ref 5–15)
BUN: 8 mg/dL (ref 6–20)
CO2: 21 mmol/L — ABNORMAL LOW (ref 22–32)
Calcium: 9.5 mg/dL (ref 8.9–10.3)
Chloride: 104 mmol/L (ref 98–111)
Creatinine, Ser: 0.65 mg/dL (ref 0.44–1.00)
GFR, Estimated: >60 mL/min (ref >60)
Glucose, Bld: 90 mg/dL (ref 70–99)
Potassium: 4.1 mmol/L (ref 3.5–5.1)
Sodium: 138 mmol/L (ref 135–145)

## 2024-03-10 LAB — MAGNESIUM: Magnesium: 2.2 mg/dL (ref 1.7–2.4)

## 2024-03-10 LAB — PHOSPHORUS: Phosphorus: 2.9 mg/dL (ref 2.5–4.6)

## 2024-03-10 MED ORDER — NITROGLYCERIN 0.4 MG SL SUBL: 0.4000 mg | SUBLINGUAL_TABLET | SUBLINGUAL | Status: DC | PRN

## 2024-03-10 MED ORDER — DAPAGLIFLOZIN PROPANEDIOL 10 MG PO TABS: 10.0000 mg | ORAL_TABLET | Freq: Every day | ORAL | 3 refills | Status: AC

## 2024-03-10 NOTE — Progress Notes (Signed)
 Outpatient cardiac CT for exertional chest pain per Dr Joelle.

## 2024-03-10 NOTE — Plan of Care (Signed)

## 2024-03-10 NOTE — Plan of Care (Signed)
 Problem: Education: Goal: Understanding of cardiac disease, CV risk reduction, and recovery process will improve 03/10/2024 1658 by Terance Leonor BRAVO, RN Outcome: Adequate for Discharge 03/10/2024 1658 by Terance Leonor BRAVO, RN Outcome: Progressing 03/10/2024 1244 by Terance Leonor BRAVO, RN Outcome: Progressing Goal: Individualized Educational Video(s) 03/10/2024 1658 by Terance Leonor BRAVO, RN Outcome: Adequate for Discharge 03/10/2024 1658 by Terance Leonor BRAVO, RN Outcome: Progressing 03/10/2024 1244 by Terance Leonor BRAVO, RN Outcome: Progressing   Problem: Activity: Goal: Ability to tolerate increased activity will improve 03/10/2024 1658 by Terance Leonor BRAVO, RN Outcome: Adequate for Discharge 03/10/2024 1658 by Terance Leonor BRAVO, RN Outcome: Progressing 03/10/2024 1244 by Terance Leonor BRAVO, RN Outcome: Progressing   Problem: Cardiac: Goal: Ability to achieve and maintain adequate cardiovascular perfusion will improve 03/10/2024 1658 by Terance Leonor BRAVO, RN Outcome: Adequate for Discharge 03/10/2024 1658 by Terance Leonor BRAVO, RN Outcome: Progressing 03/10/2024 1244 by Terance Leonor BRAVO, RN Outcome: Progressing   Problem: Health Behavior/Discharge Planning: Goal: Ability to safely manage health-related needs after discharge will improve 03/10/2024 1658 by Terance Leonor BRAVO, RN Outcome: Adequate for Discharge 03/10/2024 1658 by Terance Leonor BRAVO, RN Outcome: Progressing 03/10/2024 1244 by Terance Leonor BRAVO, RN Outcome: Progressing   Problem: Education: Goal: Knowledge of General Education information will improve Description: Including pain rating scale, medication(s)/side effects and non-pharmacologic comfort measures 03/10/2024 1658 by Terance Leonor BRAVO, RN Outcome: Adequate for Discharge 03/10/2024 1658 by Terance Leonor BRAVO, RN Outcome: Progressing 03/10/2024 1244 by Terance Leonor BRAVO, RN Outcome: Progressing   Problem: Health  Behavior/Discharge Planning: Goal: Ability to manage health-related needs will improve 03/10/2024 1658 by Terance Leonor BRAVO, RN Outcome: Adequate for Discharge 03/10/2024 1658 by Terance Leonor BRAVO, RN Outcome: Progressing 03/10/2024 1244 by Terance Leonor BRAVO, RN Outcome: Progressing   Problem: Clinical Measurements: Goal: Ability to maintain clinical measurements within normal limits will improve 03/10/2024 1658 by Terance Leonor BRAVO, RN Outcome: Adequate for Discharge 03/10/2024 1658 by Terance Leonor BRAVO, RN Outcome: Progressing 03/10/2024 1244 by Terance Leonor BRAVO, RN Outcome: Progressing Goal: Will remain free from infection 03/10/2024 1658 by Terance Leonor BRAVO, RN Outcome: Adequate for Discharge 03/10/2024 1658 by Terance Leonor BRAVO, RN Outcome: Progressing 03/10/2024 1244 by Terance Leonor BRAVO, RN Outcome: Progressing Goal: Diagnostic test results will improve 03/10/2024 1658 by Terance Leonor BRAVO, RN Outcome: Adequate for Discharge 03/10/2024 1658 by Terance Leonor BRAVO, RN Outcome: Progressing 03/10/2024 1244 by Terance Leonor BRAVO, RN Outcome: Progressing Goal: Respiratory complications will improve 03/10/2024 1658 by Terance Leonor BRAVO, RN Outcome: Adequate for Discharge 03/10/2024 1658 by Terance Leonor BRAVO, RN Outcome: Progressing 03/10/2024 1244 by Terance Leonor BRAVO, RN Outcome: Progressing Goal: Cardiovascular complication will be avoided 03/10/2024 1658 by Terance Leonor BRAVO, RN Outcome: Adequate for Discharge 03/10/2024 1658 by Terance Leonor BRAVO, RN Outcome: Progressing 03/10/2024 1244 by Terance Leonor BRAVO, RN Outcome: Progressing   Problem: Activity: Goal: Risk for activity intolerance will decrease 03/10/2024 1658 by Terance Leonor BRAVO, RN Outcome: Adequate for Discharge 03/10/2024 1658 by Terance Leonor BRAVO, RN Outcome: Progressing 03/10/2024 1244 by Terance Leonor BRAVO, RN Outcome: Progressing   Problem: Nutrition: Goal: Adequate  nutrition will be maintained 03/10/2024 1658 by Terance Leonor BRAVO, RN Outcome: Adequate for Discharge 03/10/2024 1658 by Terance Leonor BRAVO, RN Outcome: Progressing 03/10/2024 1244 by Terance Leonor BRAVO, RN Outcome: Progressing   Problem: Coping: Goal: Level of anxiety will decrease 03/10/2024 1658 by Terance Leonor BRAVO, RN Outcome: Adequate for Discharge 03/10/2024 1658 by Terance Leonor BRAVO, RN Outcome: Progressing 03/10/2024 1244  by Terance Leonor BRAVO, RN Outcome: Progressing   Problem: Elimination: Goal: Will not experience complications related to bowel motility 03/10/2024 1658 by Terance Leonor BRAVO, RN Outcome: Adequate for Discharge 03/10/2024 1658 by Terance Leonor BRAVO, RN Outcome: Progressing 03/10/2024 1244 by Terance Leonor BRAVO, RN Outcome: Progressing Goal: Will not experience complications related to urinary retention 03/10/2024 1658 by Terance Leonor BRAVO, RN Outcome: Adequate for Discharge 03/10/2024 1658 by Terance Leonor BRAVO, RN Outcome: Progressing 03/10/2024 1244 by Terance Leonor BRAVO, RN Outcome: Progressing   Problem: Pain Managment: Goal: General experience of comfort will improve and/or be controlled 03/10/2024 1658 by Terance Leonor BRAVO, RN Outcome: Adequate for Discharge 03/10/2024 1658 by Terance Leonor BRAVO, RN Outcome: Progressing 03/10/2024 1244 by Terance Leonor BRAVO, RN Outcome: Progressing   Problem: Safety: Goal: Ability to remain free from injury will improve 03/10/2024 1658 by Terance Leonor BRAVO, RN Outcome: Adequate for Discharge 03/10/2024 1658 by Terance Leonor BRAVO, RN Outcome: Progressing 03/10/2024 1244 by Terance Leonor BRAVO, RN Outcome: Progressing   Problem: Skin Integrity: Goal: Risk for impaired skin integrity will decrease 03/10/2024 1658 by Terance Leonor BRAVO, RN Outcome: Adequate for Discharge 03/10/2024 1658 by Terance Leonor BRAVO, RN Outcome: Progressing 03/10/2024 1244 by Terance Leonor BRAVO, RN Outcome:  Progressing

## 2024-03-10 NOTE — Discharge Summary (Signed)
 [facility] Discharge Summary   Patient: Tina Colon FMW:[id number]  PCP: Alec House, MD  Date of admission: 03/09/2024   Date of discharge:  03/10/2024     Discharge Diagnoses:  Principal Problem:   Chest pain Active Problems:   Dyspnea on exertion   Admitted From: Home Disposition:  Home   Recommendations for Outpatient Follow-up:  PCP: in 1 wk Follow-up with cardiology in 1 week,  Follow up LABS/TEST:  CTA coronaries   Follow-up Information     Fulp, Cammie, MD Follow up in 1 week(s).   Specialty: Family Medicine Contact information: [address] [place] [address] [phone]                Diet recommendation: Cardiac diet  Activity: The patient is advised to gradually reintroduce usual activities, as tolerated  Discharge Condition: stable  Code Status: Full code   History of present illness: As per the H and P dictated on admission.  Hospital Course:  Tina Colon is a 43 y.o. female with medical history significant of nonischemic cardiomyopathy, known ejection fraction 50 to 55%, followed by cardiology presents to the ER with shortness of breath and chest pressure sensation.  Patient denies any recent exacerbations.  First episode was left-sided chest discomfort and heavy sensation along with shortness of breath with mobility at work.  2nd and 3rd day she continued to have short lasting discomfort while moving around at work.  Denies real chest pain.  Denies any orthopnea, denies any PND.  Denies any weight gain.  Compliant to Entresto  and metoprolol .  Denies any URI symptoms, flulike symptoms.  Denies any nausea vomiting or abdominal pain.  Bowel and urine habits are normal.  Denies any dizziness or lightheadedness or syncopal episodes.   ED Course: Hemodynamically stable.  Blood pressures are normal.  Electrolytes are adequate with potassium of 3.2.  Renal functions are adequate.  Chest x-ray is essentially normal.  Initial EKG  nonischemic.  Troponins nonischemic.  Advised admission to rule out.  ER discussed with cardiology, Dr. Anner who was okay with patient staying either at [facility] or [facility].  Assessment/Plan   Chest pain: Atypical chest pain, ACS ruled out. Patient was seen by cardiology, EKG nonischemic, troponin negative. S/p aspirin  80 mg, cardiology recommended no need of aspirin  as patient has no coronary disease. Cleared by cardiology to discharge and follow-up as an outpatient.  Plan for coronary CTA as an outpatient. Patient agreed with the plan and stable to discharge.   # History of nonischemic cardiomyopathy: proBNP is normal.  Does not have evidence of congestive heart failure.  TTE LVEF 50 to 55%, no WMA.  Grade 1 DD.  Continued Entresto , Farxiga , metoprolol  and spironolactone  home medications as per cardiology.   # Hypokalemia: Potassium repleted and resolved.     # Right foot pain: No evidence of ischemia or joint swelling.  Probably musculoskeletal pain.  Follow-up with PCP as an outpatient.   Body mass index is 36.13 kg/m.  Nutrition Interventions:  Patient was ambulatory without any assistance. On the day of the discharge the patient's vitals were stable, and no other acute medical condition were reported by patient. the patient was felt safe to be discharge at Home.  Consultants: Cardiology Procedures: None  Discharge Exam: General: Appear in no distress, Oral Mucosa Clear, moist. Cardiovascular: S1 and S2 Present, no Murmur, Respiratory: normal respiratory effort, Bilateral Air entry present and no Crackles, no wheezes Abdomen: Bowel Sound present, Soft  and no tenderness. Extremities: no Pedal edema, no calf tenderness Neurology: alert and oriented to time, place, and person affect appropriate.  Filed Weights   03/09/24 1055  Weight: 83.9 kg   Vitals:   03/10/24 1044 03/10/24 1258  BP: 117/81 121/74  Pulse: 81 66  Resp:  18  Temp:  98 F (36.7 C)   SpO2:  97%    DISCHARGE MEDICATION: Allergies as of 03/10/2024       Reactions   Penicillins Anaphylaxis, Other (See Comments)   Has patient had a PCN reaction causing immediate rash, facial/tongue/throat swelling, SOB or lightheadedness with hypotension:Yes Has patient had a PCN reaction that required hospitalization:Yes        Medication List     STOP taking these medications    AMBULATORY NON FORMULARY MEDICATION   doxycycline  100 MG capsule Commonly known as: VIBRAMYCIN    predniSONE  20 MG tablet Commonly known as: DELTASONE        TAKE these medications    dapagliflozin  propanediol 10 MG Tabs tablet Commonly known as: Farxiga  Take 1 tablet (10 mg total) by mouth daily before breakfast.   Entresto  97-103 MG Generic drug: sacubitril -valsartan  Take 1 tablet by mouth 2 (two) times daily.   magnesium  oxide 400 (240 Mg) MG tablet Commonly known as: MAGnesium -Oxide Take 1 tablet (400 mg total) by mouth 2 (two) times daily. What changed: when to take this   metoprolol  succinate 50 MG 24 hr tablet Commonly known as: TOPROL -XL Take 1 tablet (50 mg total) by mouth daily. Take with or immediately following a meal.   pantoprazole  40 MG tablet Commonly known as: PROTONIX  Take 1 tablet (40 mg total) by mouth daily.   spironolactone  25 MG tablet Commonly known as: ALDACTONE  Take 1 tablet (25 mg total) by mouth daily.   Tri-Lo-Mili  0.18/0.215/0.25 MG-25 MCG Tabs Generic drug: Norgestimate -Eth Estradiol  Take 1 tablet by mouth in the morning.       Allergies  Allergen Reactions   Penicillins Anaphylaxis and Other (See Comments)    Has patient had a PCN reaction causing immediate rash, facial/tongue/throat swelling, SOB or lightheadedness with hypotension:Yes Has patient had a PCN reaction that required hospitalization:Yes   Discharge Instructions     Call MD for:  difficulty breathing, headache or visual disturbances   Complete by: As directed    Call MD  for:  extreme fatigue   Complete by: As directed    Call MD for:  persistant dizziness or light-headedness   Complete by: As directed    Call MD for:  persistant nausea and vomiting   Complete by: As directed    Call MD for:  severe uncontrolled pain   Complete by: As directed    Call MD for:  temperature >100.4   Complete by: As directed    Diet - low sodium heart healthy   Complete by: As directed    Discharge instructions   Complete by: As directed    Follow-up with PCP in 1 week Follow-up with cardiology in 1 week for coronary CTA outpatient   Increase activity slowly   Complete by: As directed        The results of significant diagnostics from this hospitalization (including imaging, microbiology, ancillary and laboratory) are listed below for reference.    Significant Diagnostic Studies: ECHOCARDIOGRAM COMPLETE Result Date: 03/10/2024    ECHOCARDIOGRAM REPORT   Patient Name:   Tina Colon Date of Exam: 03/10/2024 Medical Rec #:  [id number]       Height:  60.0 in Accession #:    [id number]      Weight:       185.0 lb Date of Birth:  July 23, 1980       BSA:          1.806 m Patient Age:    43 years        BP:           117/81 mmHg Patient Gender: F               HR:           73 bpm. Exam Location:  Inpatient Procedure: 2D Echo, Cardiac Doppler, Color Doppler and Strain Analysis (Both            Spectral and Color Flow Doppler were utilized during procedure). Indications:    Chest Pain R07.9  History:        Patient has prior history of Echocardiogram examinations, most                 recent 03/18/2023. CHF and Cardiomyopathy; Signs/Symptoms:Dyspnea                 and Chest Pain. History of ETOH abuse.  Sonographer:    BERNARDA ROCKS Referring Phys: [id number] KUBER GHIMIRE IMPRESSIONS  1. Left ventricular ejection fraction, by estimation, is 50 to 55%. Left ventricular ejection fraction by 2D MOD biplane is 52.9 %. The left ventricle has low normal function. The left ventricle has no  regional wall motion abnormalities. Left ventricular diastolic parameters are consistent with Grade I diastolic dysfunction (impaired relaxation). The average left ventricular global longitudinal strain is -21.3 %. The global longitudinal strain is normal.  2. Right ventricular systolic function is normal. The right ventricular size is normal. There is normal pulmonary artery systolic pressure. The estimated right ventricular systolic pressure is 19.6 mmHg.  3. The mitral valve is grossly normal. Trivial mitral valve regurgitation.  4. The aortic valve is tricuspid. Aortic valve regurgitation is not visualized.  5. The inferior vena cava is normal in size with greater than 50% respiratory variability, suggesting right atrial pressure of 3 mmHg. Comparison(s): Changes from prior study are noted. 03/18/2023: LVEF 50-55%. FINDINGS  Left Ventricle: Left ventricular ejection fraction, by estimation, is 50 to 55%. Left ventricular ejection fraction by 2D MOD biplane is 52.9 %. The left ventricle has low normal function. The left ventricle has no regional wall motion abnormalities. The average left ventricular global longitudinal strain is -21.3 %. Strain was performed and the global longitudinal strain is normal. The left ventricular internal cavity size was normal in size. There is no left ventricular hypertrophy. Left ventricular diastolic parameters are consistent with Grade I diastolic dysfunction (impaired relaxation). Indeterminate filling pressures. Right Ventricle: The right ventricular size is normal. No increase in right ventricular wall thickness. Right ventricular systolic function is normal. There is normal pulmonary artery systolic pressure. The tricuspid regurgitant velocity is 2.04 m/s, and  with an assumed right atrial pressure of 3 mmHg, the estimated right ventricular systolic pressure is 19.6 mmHg. Left Atrium: Left atrial size was normal in size. Right Atrium: Right atrial size was normal in size.  Pericardium: Trivial pericardial effusion is present. The pericardial effusion is posterior to the left ventricle. Mitral Valve: The mitral valve is grossly normal. Trivial mitral valve regurgitation. MV peak gradient, 2.4 mmHg. The mean mitral valve gradient is 1.0 mmHg. Tricuspid Valve: The tricuspid valve is grossly normal. Tricuspid valve regurgitation is trivial. Aortic Valve: The aortic  valve is tricuspid. Aortic valve regurgitation is not visualized. Aortic valve mean gradient measures 3.0 mmHg. Aortic valve peak gradient measures 6.1 mmHg. Aortic valve area, by VTI measures 2.39 cm. Pulmonic Valve: The pulmonic valve was grossly normal. Pulmonic valve regurgitation is trivial. Aorta: The aortic root and ascending aorta are structurally normal, with no evidence of dilitation. Venous: The inferior vena cava is normal in size with greater than 50% respiratory variability, suggesting right atrial pressure of 3 mmHg. IAS/Shunts: No atrial level shunt detected by color flow Doppler.  LEFT VENTRICLE PLAX 2D                        Biplane EF (MOD) LVIDd:         4.10 cm         LV Biplane EF:   Left LVIDs:         3.10 cm                          ventricular LV PW:         0.80 cm                          ejection LV IVS:        0.80 cm                          fraction by LVOT diam:     2.20 cm                          2D MOD LV SV:         62                               biplane is LV SV Index:   34                               52.9 %. LVOT Area:     3.80 cm                                Diastology                                LV e' medial:    10.10 cm/s LV Volumes (MOD)               LV E/e' medial:  6.5 LV vol d, MOD    119.0 ml      LV e' lateral:   12.50 cm/s A2C:                           LV E/e' lateral: 5.2 LV vol d, MOD    131.0 ml A4C:                           2D Longitudinal LV vol s, MOD    56.8 ml       Strain A2C:  2D Strain GLS   -19.8 % LV vol s, MOD    64.2 ml        (A4C): A4C:                           2D Strain GLS   -22.7 % LV SV MOD A2C:   62.2 ml       (A3C): LV SV MOD A4C:   131.0 ml      2D Strain GLS   -21.2 % LV SV MOD BP:    68.3 ml       (A2C):                                2D Strain GLS   -21.3 %                                Avg: RIGHT VENTRICLE             IVC RV Basal diam:  3.10 cm     IVC diam: 1.30 cm RV S prime:     12.80 cm/s TAPSE (M-mode): 2.5 cm RVSP:           19.6 mmHg LEFT ATRIUM             Index        RIGHT ATRIUM           Index LA diam:        3.30 cm 1.83 cm/m   RA Pressure: 3.00 mmHg LA Vol (A2C):   41.3 ml 22.87 ml/m  RA Area:     10.90 cm LA Vol (A4C):   53.9 ml 29.85 ml/m  RA Volume:   22.90 ml  12.68 ml/m LA Biplane Vol: 49.3 ml 27.30 ml/m  AORTIC VALVE                    PULMONIC VALVE AV Area (Vmax):    2.42 cm     PV Vmax:          1.00 m/s AV Area (Vmean):   2.28 cm     PV Peak grad:     4.0 mmHg AV Area (VTI):     2.39 cm     PR End Diast Vel: 1.57 msec AV Vmax:           123.00 cm/s AV Vmean:          87.100 cm/s AV VTI:            0.259 m AV Peak Grad:      6.1 mmHg AV Mean Grad:      3.0 mmHg LVOT Vmax:         78.30 cm/s LVOT Vmean:        52.200 cm/s LVOT VTI:          0.163 m LVOT/AV VTI ratio: 0.63  AORTA Ao Root diam: 2.90 cm Ao Asc diam:  3.20 cm MITRAL VALVE               TRICUSPID VALVE MV Area (PHT): 3.66 cm    TR Peak grad:   16.6 mmHg MV Area VTI:   2.62 cm    TR Vmax:        204.00 cm/s MV Peak grad:  2.4 mmHg    Estimated RAP:  3.00 mmHg MV Mean grad:  1.0 mmHg    RVSP:           19.6 mmHg MV Vmax:       0.78 m/s MV Vmean:      48.0 cm/s   SHUNTS MV Decel Time: 207 msec    Systemic VTI:  0.16 m MR Peak grad: 65.3 mmHg    Systemic Diam: 2.20 cm MR Vmax:      404.00 cm/s MV E velocity: 65.60 cm/s MV A velocity: 60.80 cm/s MV E/A ratio:  1.08 Vinie Maxcy MD Electronically signed by Vinie Maxcy MD Signature Date/Time: 03/10/2024/12:25:12 PM    Final    DG Chest 2 View Result Date: 03/09/2024 CLINICAL DATA:   Shortness of breath and chest pressure. EXAM: CHEST - 2 VIEW COMPARISON:  04/17/2022 FINDINGS: Lungs are hypoinflated and otherwise clear. Cardiomediastinal silhouette and remainder of the exam is unchanged. IMPRESSION: Hypoinflation without acute cardiopulmonary disease. Electronically Signed   By: Toribio Agreste M.D.   On: 03/09/2024 11:48    Microbiology: Recent Results (from the past 240 hours)  Resp panel by RT-PCR (RSV, Flu A&B, Covid) Anterior Nasal Swab     Status: None   Collection Time: 03/09/24 11:12 AM   Specimen: Anterior Nasal Swab  Result Value Ref Range Status   SARS Coronavirus 2 by RT PCR NEGATIVE NEGATIVE Final    Comment: (NOTE) SARS-CoV-2 target nucleic acids are NOT DETECTED.  The SARS-CoV-2 RNA is generally detectable in upper respiratory specimens during the acute phase of infection. The lowest concentration of SARS-CoV-2 viral copies this assay can detect is 138 copies/mL. A negative result does not preclude SARS-Cov-2 infection and should not be used as the sole basis for treatment or other patient management decisions. A negative result may occur with  improper specimen collection/handling, submission of specimen other than nasopharyngeal swab, presence of viral mutation(s) within the areas targeted by this assay, and inadequate number of viral copies(<138 copies/mL). A negative result must be combined with clinical observations, patient history, and epidemiological information. The expected result is Negative.  Fact Sheet for Patients:  BloggerCourse.com  Fact Sheet for Healthcare Providers:  SeriousBroker.it  This test is no t yet approved or cleared by the [place]  FDA and  has been authorized for detection and/or diagnosis of SARS-CoV-2 by FDA under an Emergency Use Authorization (EUA). This EUA will remain  in effect (meaning this test can be used) for the duration of the COVID-19 declaration  under Section 564(b)(1) of the Act, 21 U.S.C.section 360bbb-3(b)(1), unless the authorization is terminated  or revoked sooner.       Influenza A by PCR NEGATIVE NEGATIVE Final   Influenza B by PCR NEGATIVE NEGATIVE Final    Comment: (NOTE) The Xpert Xpress SARS-CoV-2/FLU/RSV plus assay is intended as an aid in the diagnosis of influenza from Nasopharyngeal swab specimens and should not be used as a sole basis for treatment. Nasal washings and aspirates are unacceptable for Xpert Xpress SARS-CoV-2/FLU/RSV testing.  Fact Sheet for Patients: BloggerCourse.com  Fact Sheet for Healthcare Providers: SeriousBroker.it  This test is not yet approved or cleared by the [place]  FDA and has been authorized for detection and/or diagnosis of SARS-CoV-2 by FDA under an Emergency Use Authorization (EUA). This EUA will remain in effect (meaning this test can be used) for the duration of the COVID-19 declaration under Section 564(b)(1) of the Act, 21 U.S.C. section 360bbb-3(b)(1), unless the authorization is terminated or revoked.     Resp Syncytial  Virus by PCR NEGATIVE NEGATIVE Final    Comment: (NOTE) Fact Sheet for Patients: BloggerCourse.com  Fact Sheet for Healthcare Providers: SeriousBroker.it  This test is not yet approved or cleared by the [place]  FDA and has been authorized for detection and/or diagnosis of SARS-CoV-2 by FDA under an Emergency Use Authorization (EUA). This EUA will remain in effect (meaning this test can be used) for the duration of the COVID-19 declaration under Section 564(b)(1) of the Act, 21 U.S.C. section 360bbb-3(b)(1), unless the authorization is terminated or revoked.  Performed at [facility], [address] [place], [place] [address]      Labs: CBC: Recent Labs  Lab 03/09/24 1100 03/10/24 0550  WBC 6.0 5.0  HGB  12.7 12.4  HCT 37.6 37.5  MCV 87.6 90.6  PLT 254 236   Basic Metabolic Panel: Recent Labs  Lab 03/09/24 1100 03/10/24 0550  NA 137 138  K 3.2* 4.1  CL 103 104  CO2 20* 21*  GLUCOSE 110* 90  BUN 10 8  CREATININE 0.64 0.65  CALCIUM  9.7 9.5  MG 2.0 2.2  PHOS  --  2.9   Liver Function Tests: Recent Labs  Lab 03/09/24 1100  AST 20  ALT 14  ALKPHOS 43  BILITOT 0.3  PROT 7.1  ALBUMIN 4.3   Recent Labs  Lab 03/09/24 1116  LIPASE 35   No results for input(s): AMMONIA in the last 168 hours. Cardiac Enzymes: No results for input(s): CKTOTAL, CKMB, CKMBINDEX, TROPONINI in the last 168 hours. BNP (last 3 results) Recent Labs    09/19/23 1930  BNP 29.8   CBG: No results for input(s): GLUCAP in the last 168 hours.  Time spent: 35 minutes  Signed:  Elvan Sor  [facility] 03/10/2024 4:18 PM

## 2024-03-10 NOTE — Consult Note (Signed)
 Cardiology Consultation   Patient ID: Tina Colon MRN: [id number]; DOB: August 23, 1980  Admit date: 03/09/2024 Date of Consult: 03/10/2024  PCP:  Alec House, MD   [facility] Providers Cardiologist:  Newman JINNY Lawrence, MD      Patient Profile: Tina Colon is a 43 y.o. female with a hx of alcohol abuse, marijuana abuse, prior tobacco abuse, QTc prolongation, family history of premature coronary artery disease, pancreatitis, nonischemic cardiomyopathy, heart failure with improved LVEF, who is being seen 03/10/2024 for the evaluation of Chest pain at the request of Elvan Sor MD.  History of Present Illness: Tina Colon is a 43 year old female with prior cardiac history listed below  On 12/2017 the patient had a reduced LVEF of 15 to 20%.  Because of this the patient was placed on GDMT.  The LVEF subsequently improved to 50% on 07/2018. Because of the reduced LVEF a coronary CTA was done on 03/2018 and found normal coronary arteries with no evidence of CAD.  On 09/2019 the patient was hospitalized for shortness of breath, abdominal pain, nausea, and vomiting.  Troponins were greater than 2100. An echo was done and showed a reduced LVEF of 15 to 20%.  A left and right heart cath was done and showed normal coronary arteries.  It was suspected that the symptoms were likely secondary to alcohol abuse.  The patient was discharged home on GDMT.  A repeat echo was done on 11/2019 and showed an improved LVEF of 50 to 55%.  A cardiac monitor was worn for 12 days because the patient reported palpitations.  It found 2 runs brief runs of SVT and no concerning arrhythmias.  An echo was done on 10/2022 and showed a moderately reduced LVEF of 45 to 50%.  Because of this the patient was restarted on Jardiance.  Follow-up echo on 03/2023 showed a normal LVEF 50 to 55%, no RWMA, normal RV function, and grossly normal valve function.  At last visit with Dr. Lawrence on 09/2023 the patient was  stable and was maintained on Entresto , Aldactone , metoprolol , and Ivabradine .  Patient was counseled on alcohol sensation.  Patient presented to the emergency department for chest pain and exertional dyspnea for the past 3 days.  On interview patient reported that she presented to the hospital for a left-sided chest pressure and dyspnea on exertion that started this past Saturday.  The patient's chest pressure is worse with exertion and is typically 5-6 out of 10.  The pressure is more localized and at times radiates towards her back.  The pressure improved when she took it easy on Sunday and Monday but worsened when she went back to work on Tuesday.  Denies any chest pressure since being in the hospital but has only gotten up to walk to the bathroom.  The patient also reported having some palpitations, numbness, and tingling.  She reported that she works at [employer] and regularly has to [employer].  Denies lower extremity edema, orthopnea, nausea, vomiting, and diaphoresis.  Reported that she typically excises 3 days a week and goes about 1 mile on a treadmill or ellipses machine.  Reported that the last time she drank was 1 week ago.  Denies nicotine use, cannabis use, illicit substance use.  Labs showed hypokalemia with a potassium of 3.2, magnesium  of 2.0, creatinine of 0.64, normal LFTs within the reference ranges, negative proBNP of less than 50, negative troponins of less than 15 x 2, negative D-dimer 0.39, normal hemoglobin of 12.7, normal leukocytes  of 6.0, respiratory panel was negative, and pregnancy test was negative.  Chest x-ray showed: Hypoinflation without acute cardiopulmonary disease.   EKG showed normal sinus rhythm with a rate of 98.  Past Medical History:  Diagnosis Date   Cardiomyopathy [facility])    CHF (congestive heart failure) (HCC)    ETOH abuse    Gastritis    GERD (gastroesophageal reflux disease)    Heart failure (HCC)    Pancreatitis     Past Surgical History:   Procedure Laterality Date   ESOPHAGOGASTRODUODENOSCOPY  06/19/2015    Dr Shila   RIGHT/LEFT HEART CATH AND CORONARY ANGIOGRAPHY N/A 09/26/2019   Procedure: RIGHT/LEFT HEART CATH AND CORONARY ANGIOGRAPHY;  Surgeon: Elmira Newman PARAS, MD;  Location: [facility] INVASIVE CV LAB;  Service: Cardiovascular;  Laterality: N/A;   WISDOM TOOTH EXTRACTION       Home Medications:  Prior to Admission medications   Medication Sig Start Date End Date Taking? Authorizing Provider  MAGnesium -Oxide 400 (240 Mg) MG tablet Take 1 tablet (400 mg total) by mouth 2 (two) times daily. Patient taking differently: Take 400 mg by mouth in the morning. 12/14/23  Yes Patwardhan, Newman PARAS, MD  metoprolol  succinate (TOPROL -XL) 50 MG 24 hr tablet Take 1 tablet (50 mg total) by mouth daily. Take with or immediately following a meal. 09/14/23  Yes Patwardhan, Manish J, MD  pantoprazole  (PROTONIX ) 40 MG tablet Take 1 tablet (40 mg total) by mouth daily. 01/19/23  Yes Charlanne Groom, MD  sacubitril -valsartan  (ENTRESTO ) 97-103 MG Take 1 tablet by mouth 2 (two) times daily. 09/14/23  Yes Patwardhan, Manish J, MD  spironolactone  (ALDACTONE ) 25 MG tablet Take 1 tablet (25 mg total) by mouth daily. 09/14/23  Yes Patwardhan, Manish J, MD  TRI-LO-MILI  0.18/0.215/0.25 MG-25 MCG tab Take 1 tablet by mouth in the morning. 10/16/19  Yes [provider]  AMBULATORY NON FORMULARY MEDICATION Medication Name: Diltiazem 2%/Lidocaine  2%   Using your index finger apply a small amount of medication inside the anal opening and to the external anal area twice daily x 6 weeks. Patient not taking: Reported on 03/09/2024 12/16/21   Craig Alan SAUNDERS, PA-C  dapagliflozin  propanediol (FARXIGA ) 10 MG TABS tablet Take 1 tablet (10 mg total) by mouth daily before breakfast. Patient not taking: Reported on 03/09/2024 09/14/23   Elmira Newman PARAS, MD  doxycycline  (VIBRAMYCIN ) 100 MG capsule Take 1 capsule (100 mg total) by mouth 2 (two) times daily. Patient not  taking: Reported on 03/09/2024 09/19/23   Bernis Ernst, PA-C  predniSONE  (DELTASONE ) 20 MG tablet Take 2 tablets (40 mg total) by mouth daily. Patient not taking: Reported on 03/09/2024 09/19/23   Bernis Ernst, PA-C    Scheduled Meds:  aspirin  EC  81 mg Oral Daily   dapagliflozin  propanediol  10 mg Oral QAC breakfast   enoxaparin  (LOVENOX ) injection  40 mg Subcutaneous Q24H   metoprolol  succinate  50 mg Oral Daily   pantoprazole   40 mg Oral Daily   sacubitril -valsartan   1 tablet Oral BID   spironolactone   25 mg Oral Daily   Continuous Infusions:  PRN Meds: acetaminophen   Allergies:    Allergies  Allergen Reactions   Penicillins Anaphylaxis and Other (See Comments)    Has patient had a PCN reaction causing immediate rash, facial/tongue/throat swelling, SOB or lightheadedness with hypotension:Yes Has patient had a PCN reaction that required hospitalization:Yes    Social History:   Social History   Socioeconomic History   Marital status: Married    Spouse name:  Not on file   Number of children: 0   Years of education: Not on file   Highest education level: Not on file  Occupational History   Occupation: retail  Tobacco Use   Smoking status: Former    Current packs/day: 0.00    Average packs/day: 0.5 packs/day for 10.0 years (5.0 ttl pk-yrs)    Types: Cigarettes    Start date: 2001    Quit date: 2011    Years since quitting: 14.7   Smokeless tobacco: Never  Vaping Use   Vaping status: Never Used  Substance and Sexual Activity   Alcohol use: Not Currently    Comment: occ   Drug use: Yes    Types: Marijuana    Comment: last smoked 09/20/2019   Sexual activity: Not Currently  Other Topics Concern   Not on file  Social History Narrative   Not on file   Social Drivers of Health   Financial Resource Strain: Not on file  Food Insecurity: Not on file  Transportation Needs: Not on file  Physical Activity: Not on file  Stress: Not on file  Social Connections: Not  on file  Intimate Partner Violence: Not on file    Family History:    Family History  Problem Relation Age of Onset   Diabetes Mother    Hyperlipidemia Mother    Hypertension Father    Gout Father    Gallstones Sister        Had her gallbladder removed    Colon polyps Sister    Stomach cancer Maternal Apolinar Canon that his siblings died with cancer also but not sure what kind   Gallstones Paternal Aunt        said a few on her dad's side    Diabetes Other    Heart disease Other    Irritable bowel syndrome Other      ROS:  Please see the history of present illness.   All other ROS reviewed and negative.     Physical Exam/Data: Vitals:   03/10/24 0445 03/10/24 0510 03/10/24 0552 03/10/24 0553  BP:  105/68 117/81   Pulse: 76 78 89 81  Resp: (!) 22  15 15   Temp:  98 F (36.7 C) 98.3 F (36.8 C)   TempSrc:  Oral Oral   SpO2: 93% 98% 98% 97%  Weight:      Height:       No intake or output data in the 24 hours ending 03/10/24 0722    03/09/2024   10:55 AM 09/14/2023    2:55 PM 01/19/2023   10:59 AM  Last 3 Weights  Weight (lbs) 185 lb 190 lb 181 lb  Weight (kg) 83.915 kg 86.183 kg 82.101 kg     Body mass index is 36.13 kg/m.  General:  Well nourished, well developed, in no acute distress.  Alert and orientated on room air.  Patient was laying comfortably on bed during the interview. HEENT: normal Neck: no JVD Vascular: No carotid bruits; Distal pulses 2+ bilaterally Cardiac:  normal S1, S2; RRR; no murmur.  Chest nontender to palpation. Lungs:  clear to auscultation bilaterally, no wheezing, rhonchi or rales  Abd: Has some mild epigastric tenderness Ext: no edema Musculoskeletal:  No deformities. Skin: warm and dry  Neuro:   no focal abnormalities noted Psych:  Normal affect   EKG:  The EKG was personally reviewed and demonstrates: Normal sinus rhythm with a rate of 98 Telemetry:  Telemetry was personally reviewed and demonstrates: Normal sinus  rhythm with resting heart rates typically in the 70s to 80s.  There appears to be some sinus arrhythmia.  Relevant CV Studies: Echo pending  Laboratory Data: High Sensitivity Troponin:  No results for input(s): TROPONINIHS in the last 720 hours.   Chemistry Recent Labs  Lab 03/09/24 1100  NA 137  K 3.2*  CL 103  CO2 20*  GLUCOSE 110*  BUN 10  CREATININE 0.64  CALCIUM  9.7  MG 2.0  GFRNONAA >60  ANIONGAP 14    Recent Labs  Lab 03/09/24 1100  PROT 7.1  ALBUMIN 4.3  AST 20  ALT 14  ALKPHOS 43  BILITOT 0.3   Lipids No results for input(s): CHOL, TRIG, HDL, LABVLDL, LDLCALC, CHOLHDL in the last 168 hours.  Hematology Recent Labs  Lab 03/09/24 1100  WBC 6.0  RBC 4.29  HGB 12.7  HCT 37.6  MCV 87.6  MCH 29.6  MCHC 33.8  RDW 13.0  PLT 254   Thyroid No results for input(s): TSH, FREET4 in the last 168 hours.  BNP Recent Labs  Lab 03/09/24 1100  PROBNP <50.0    DDimer  Recent Labs  Lab 03/09/24 1116  DDIMER 0.39    Radiology/Studies:  DG Chest 2 View Result Date: 03/09/2024 CLINICAL DATA:  Shortness of breath and chest pressure. EXAM: CHEST - 2 VIEW COMPARISON:  04/17/2022 FINDINGS: Lungs are hypoinflated and otherwise clear. Cardiomediastinal silhouette and remainder of the exam is unchanged. IMPRESSION: Hypoinflation without acute cardiopulmonary disease. Electronically Signed   By: Toribio Agreste M.D.   On: 03/09/2024 11:48     Assessment and Plan: Tina Colon is a 43 y.o. female with a hx of alcohol abuse, marijuana abuse, prior tobacco abuse, QTc prolongation, family history of premature coronary artery disease, pancreatitis, nonischemic cardiomyopathy, heart failure with improved LVEF, who is being seen 03/10/2024 for the evaluation of Chest pain at the request of Elvan Sor MD.  Dyspnea on exertion Chest pain Palpitations Since this past Saturday had 5 out of 10 chest pain/tightness and dyspnea on exertion.  Denies any ongoing  chest tightness at rest.  Chest tightness is worse with exertion.  Denies any lower extremity edema or orthopnea.  Also reported having some palpitations/flutter sensation. High-sensitivity troponins were less than 15 x 2 D-dimer was negative at 0.39 No acute ischemic changes on EKG Chest x-ray showed: Hypoinflation without acute cardiopulmonary disease.  With negative troponins, age, and cardiac catheterization on 09/2019 that showed normal coronary arteries ACS is unlikely. Recommend the patient ambulate in the hall see if symptoms are ongoing. Echo pending. Order PRN nitroglycerin  May consider 14-day outpatient cardiac monitor. No inpatient ischemic evaluation necessary at this time.  Order outpatient cardiac CT. Stop aspirin  81 mg as patient does not have CAD.   Heart failure with recovered LVEF Nonischemic cardiomyopathy Hypertension On 4/21 the patient had a reduced LVEF of 15 to 20%.  At that time a cardiac catheterization was done and showed normal coronary arteries.  It was suspected that this heart failure was secondary to alcohol abuse.  On interview patient reported the last time she drank alcohol was 1 week ago and that she has reduced her intake significantly.  Denied ongoing nicotine use, cannabis use, illicit substance use. Patient appears euvolemic on exam.  Most recent blood pressure was 117/81.  Reported measuring blood pressure on Saturday and was slightly elevated such as 124/91. GDMT Continue metoprolol  succinate 50 mg daily. Continue Farxiga  10  mg daily. Continue Entresto  97-103 twice daily Continue Aldactone  25 mg daily   Borderline QTc prolongation Has a history of QTc prolongation on prior hospitalizations.  This was likely due to antinausea medications. EKG yesterday showed borderline QTc prolongation with a QTcB of 454.  EKG today showed a slightly longer QTc but the rates were faster. Avoid rate prolonging agents   Otherwise management per  primary     Risk Assessment/Risk Scores:        [facility] will sign off if no significant changes on echo.   The patient is ready for discharge today from a cardiac standpoint. Medication Recommendations:  as above Other recommendations (labs, testing, etc):  Order outpatient cardiac CT. Follow up as an outpatient:  will schedule in 2-4 weeks following cardiac CT  For questions or updates, please contact [facility] Please consult [email or web address] for contact info under    Morse Clause, PA-C  03/10/2024 7:22 AM

## 2024-03-10 NOTE — ED Notes (Signed)
 ED TO INPATIENT HANDOFF REPORT  ED Nurse Name and Phone #: Con RAMAN Name/Age/Gender Tina Colon 43 y.o. female Room/Bed: WA21/WA21  Code Status   Code Status: Full Code  Home/SNF/Other Home Patient oriented to: self, place, time, and situation Is this baseline? Yes   Triage Complete: Triage complete  Chief Complaint Chest pain [R07.9]  Triage Note Pt presents with [facility] and chest pressure on exertion  starting on Sat while at work, due to history of CHF cardiology instructed her to come to the ED.   Allergies Allergies  Allergen Reactions   Penicillins Anaphylaxis and Other (See Comments)    Has patient had a PCN reaction causing immediate rash, facial/tongue/throat swelling, SOB or lightheadedness with hypotension:Yes Has patient had a PCN reaction that required hospitalization:Yes    Level of Care/Admitting Diagnosis ED Disposition     ED Disposition  Admit   Condition  --   Comment  Hospital Area: [facility] [[id number]]  Level of Care: Telemetry [5]  Admit to tele based on following criteria: Monitor for Ischemic changes  May place patient in observation at [facility] if equivalent level of care is available:: Yes  Covid Evaluation: Asymptomatic - no recent exposure (last 10 days) testing not required  Diagnosis: Chest pain [255200]  Admitting Physician: RAENELLE CORIA [[id number]]  Attending Physician: RAENELLE CORIA [[id number]]  For patients discharging to extended facilities (i.e. SNF, AL, group homes or LTAC) initiate:: Discharge to SNF/Facility Placement COVID-19 Lab Testing Protocol          B Medical/Surgery History Past Medical History:  Diagnosis Date   Cardiomyopathy (HCC)    CHF (congestive heart failure) (HCC)    ETOH abuse    Gastritis    GERD (gastroesophageal reflux disease)    Heart failure (HCC)    Pancreatitis    Past Surgical History:  Procedure Laterality Date   ESOPHAGOGASTRODUODENOSCOPY   06/19/2015    Dr Shila   RIGHT/LEFT HEART CATH AND CORONARY ANGIOGRAPHY N/A 09/26/2019   Procedure: RIGHT/LEFT HEART CATH AND CORONARY ANGIOGRAPHY;  Surgeon: Elmira Newman PARAS, MD;  Location: [facility] INVASIVE CV LAB;  Service: Cardiovascular;  Laterality: N/A;   WISDOM TOOTH EXTRACTION       A IV Location/Drains/Wounds Patient Lines/Drains/Airways Status     Active Line/Drains/Airways     Name Placement date Placement time Site Days   Peripheral IV 03/09/24 20 G Anterior;Distal;Right Forearm 03/09/24  1134  Forearm  1            Intake/Output Last 24 hours No intake or output data in the 24 hours ending 03/10/24 9367  Labs/Imaging Results for orders placed or performed during the hospital encounter of 03/09/24 (from the past 48 hours)  Basic metabolic panel     Status: Abnormal   Collection Time: 03/09/24 11:00 AM  Result Value Ref Range   Sodium 137 135 - 145 mmol/L   Potassium 3.2 (L) 3.5 - 5.1 mmol/L   Chloride 103 98 - 111 mmol/L   CO2 20 (L) 22 - 32 mmol/L   Glucose, Bld 110 (H) 70 - 99 mg/dL    Comment: Glucose reference range applies only to samples taken after fasting for at least 8 hours.   BUN 10 6 - 20 mg/dL   Creatinine, Ser 9.35 0.44 - 1.00 mg/dL   Calcium  9.7 8.9 - 10.3 mg/dL   GFR, Estimated >39 >39 mL/min    Comment: (NOTE) Calculated using the CKD-EPI Creatinine Equation (2021)  Anion gap 14 5 - 15    Comment: Performed at [facility], [address], [place], [place] [address]  CBC     Status: None   Collection Time: 03/09/24 11:00 AM  Result Value Ref Range   WBC 6.0 4.0 - 10.5 K/uL   RBC 4.29 3.87 - 5.11 MIL/uL   Hemoglobin 12.7 12.0 - 15.0 g/dL   HCT 62.3 63.9 - 53.9 %   MCV 87.6 80.0 - 100.0 fL   MCH 29.6 26.0 - 34.0 pg   MCHC 33.8 30.0 - 36.0 g/dL   RDW 86.9 88.4 - 84.4 %   Platelets 254 150 - 400 K/uL   nRBC 0.0 0.0 - 0.2 %    Comment: Performed at [facility], [address], [place],  [place] [address]  hCG, serum, qualitative     Status: None   Collection Time: 03/09/24 11:00 AM  Result Value Ref Range   Preg, Serum NEGATIVE NEGATIVE    Comment:        THE SENSITIVITY OF THIS METHODOLOGY IS >10 mIU/mL. Performed at [facility], [address], [place], [place] [address]   Pro Brain natriuretic peptide     Status: None   Collection Time: 03/09/24 11:00 AM  Result Value Ref Range   Pro Brain Natriuretic Peptide <50.0 <300.0 pg/mL    Comment: (NOTE) Age Group        Cut-Points    Interpretation  < 50 years     450 pg/mL       NT-proBNP > 450 pg/mL indicates                                ADHF is likely              50 to 75 years  900 pg/mL      NT-proBNP > 900 pg/mL indicates          ADHF is likely  > 75 years      1800 pg/mL     NT-proBNP > 1800 pg/mL indicates          ADHF is likely                           All ages    Results between       Indeterminate. Further clinical             300 and the cut-   information is needed to determine            point for age group   if ADHF is present.                                                             Elecsys proBNP II/ Elecsys proBNP II STAT           Cut-Point                       Interpretation  300 pg/mL                    NT-proBNP <300pg/mL indicates  ADHF is not likely  Performed at [facility], [address] [place], [place] [address]   Hepatic function panel     Status: Abnormal   Collection Time: 03/09/24 11:00 AM  Result Value Ref Range   Total Protein 7.1 6.5 - 8.1 g/dL   Albumin 4.3 3.5 - 5.0 g/dL   AST 20 15 - 41 U/L   ALT 14 0 - 44 U/L   Alkaline Phosphatase 43 38 - 126 U/L   Total Bilirubin 0.3 0.0 - 1.2 mg/dL   Bilirubin, Direct 0.1 0.0 - 0.2 mg/dL   Indirect Bilirubin 0.2 (L) 0.3 - 0.9 mg/dL    Comment: Performed at [facility], [address] [place], [place] [address]  Magnesium      Status:  None   Collection Time: 03/09/24 11:00 AM  Result Value Ref Range   Magnesium  2.0 1.7 - 2.4 mg/dL    Comment: Performed at [facility], [address] [place], [place] [address]  Troponin T, High Sensitivity     Status: None   Collection Time: 03/09/24 11:00 AM  Result Value Ref Range   Troponin T High Sensitivity <15 0 - 19 ng/L    Comment: (NOTE) Biotin concentrations > 1000 ng/mL falsely decrease TnT results.  Serial cardiac troponin measurements are suggested.  Refer to the Links section for chest pain algorithms and additional  guidance. Performed at [facility], [address], [place], [place] [address]   Resp panel by RT-PCR (RSV, Flu A&B, Covid) Anterior Nasal Swab     Status: None   Collection Time: 03/09/24 11:12 AM   Specimen: Anterior Nasal Swab  Result Value Ref Range   SARS Coronavirus 2 by RT PCR NEGATIVE NEGATIVE    Comment: (NOTE) SARS-CoV-2 target nucleic acids are NOT DETECTED.  The SARS-CoV-2 RNA is generally detectable in upper respiratory specimens during the acute phase of infection. The lowest concentration of SARS-CoV-2 viral copies this assay can detect is 138 copies/mL. A negative result does not preclude SARS-Cov-2 infection and should not be used as the sole basis for treatment or other patient management decisions. A negative result may occur with  improper specimen collection/handling, submission of specimen other than nasopharyngeal swab, presence of viral mutation(s) within the areas targeted by this assay, and inadequate number of viral copies(<138 copies/mL). A negative result must be combined with clinical observations, patient history, and epidemiological information. The expected result is Negative.  Fact Sheet for Patients:  BloggerCourse.com  Fact Sheet for Healthcare Providers:  SeriousBroker.it  This test is no t yet approved or cleared by  the [place]  FDA and  has been authorized for detection and/or diagnosis of SARS-CoV-2 by FDA under an Emergency Use Authorization (EUA). This EUA will remain  in effect (meaning this test can be used) for the duration of the COVID-19 declaration under Section 564(b)(1) of the Act, 21 U.S.C.section 360bbb-3(b)(1), unless the authorization is terminated  or revoked sooner.       Influenza A by PCR NEGATIVE NEGATIVE   Influenza B by PCR NEGATIVE NEGATIVE    Comment: (NOTE) The Xpert Xpress SARS-CoV-2/FLU/RSV plus assay is intended as an aid in the diagnosis of influenza from Nasopharyngeal swab specimens and should not be used as a sole basis for treatment. Nasal washings and aspirates are unacceptable for Xpert Xpress SARS-CoV-2/FLU/RSV testing.  Fact Sheet for Patients: BloggerCourse.com  Fact Sheet for Healthcare Providers: SeriousBroker.it  This test is not yet approved or  cleared by the [place]  FDA and has been authorized for detection and/or diagnosis of SARS-CoV-2 by FDA under an Emergency Use Authorization (EUA). This EUA will remain in effect (meaning this test can be used) for the duration of the COVID-19 declaration under Section 564(b)(1) of the Act, 21 U.S.C. section 360bbb-3(b)(1), unless the authorization is terminated or revoked.     Resp Syncytial Virus by PCR NEGATIVE NEGATIVE    Comment: (NOTE) Fact Sheet for Patients: BloggerCourse.com  Fact Sheet for Healthcare Providers: SeriousBroker.it  This test is not yet approved or cleared by the [place]  FDA and has been authorized for detection and/or diagnosis of SARS-CoV-2 by FDA under an Emergency Use Authorization (EUA). This EUA will remain in effect (meaning this test can be used) for the duration of the COVID-19 declaration under Section 564(b)(1) of the Act, 21 U.S.C. section  360bbb-3(b)(1), unless the authorization is terminated or revoked.  Performed at [facility], [address] [place], [place] [address]   Lipase, blood     Status: None   Collection Time: 03/09/24 11:16 AM  Result Value Ref Range   Lipase 35 11 - 51 U/L    Comment: Performed at [facility], [address], [place], [place] [address]  D-dimer, quantitative     Status: None   Collection Time: 03/09/24 11:16 AM  Result Value Ref Range   D-Dimer, Quant 0.39 0.00 - 0.50 ug/mL-FEU    Comment: (NOTE) At the manufacturer cut-off value of 0.5 g/mL FEU, this assay has a negative predictive value of 95-100%.This assay is intended for use in conjunction with a clinical pretest probability (PTP) assessment model to exclude pulmonary embolism (PE) and deep venous thrombosis (DVT) in outpatients suspected of PE or DVT. Results should be correlated with clinical presentation. Performed at [facility], [address] [place], [place] [address]   Troponin T, High Sensitivity     Status: None   Collection Time: 03/09/24  3:32 PM  Result Value Ref Range   Troponin T High Sensitivity <15 0 - 19 ng/L    Comment: (NOTE) Biotin concentrations > 1000 ng/mL falsely decrease TnT results.  Serial cardiac troponin measurements are suggested.  Refer to the Links section for chest pain algorithms and additional  guidance. Performed at [facility], [address], [place], [place] [address]    DG Chest 2 View Result Date: 03/09/2024 CLINICAL DATA:  Shortness of breath and chest pressure. EXAM: CHEST - 2 VIEW COMPARISON:  04/17/2022 FINDINGS: Lungs are hypoinflated and otherwise clear. Cardiomediastinal silhouette and remainder of the exam is unchanged. IMPRESSION: Hypoinflation without acute cardiopulmonary disease. Electronically Signed   By: Toribio Agreste M.D.   On: 03/09/2024 11:48    Pending Labs Unresulted Labs (From  admission, onward)     Start     Ordered   03/09/24 1618  HIV Antibody (routine testing w rflx)  (HIV Antibody (Routine testing w reflex) panel)  Once,   R        03/09/24 1619            Vitals/Pain Today's Vitals   03/10/24 0445 03/10/24 0510 03/10/24 0552 03/10/24 0553  BP:  105/68 117/81   Pulse: 76 78 89 81  Resp: (!) 22  15 15   Temp:  98 F (36.7 C) 98.3 F (36.8 C)   TempSrc:  Oral Oral   SpO2: 93% 98% 98% 97%  Weight:      Height:  PainSc:        Isolation Precautions No active isolations  Medications Medications  dapagliflozin  propanediol (FARXIGA ) tablet 10 mg (has no administration in time range)  metoprolol  succinate (TOPROL -XL) 24 hr tablet 50 mg (has no administration in time range)  pantoprazole  (PROTONIX ) EC tablet 40 mg (has no administration in time range)  sacubitril -valsartan  (ENTRESTO ) 97-103 mg per tablet (1 tablet Oral Given 03/09/24 2158)  spironolactone  (ALDACTONE ) tablet 25 mg (has no administration in time range)  acetaminophen  (TYLENOL ) tablet 650 mg (has no administration in time range)  enoxaparin  (LOVENOX ) injection 40 mg (40 mg Subcutaneous Given 03/09/24 2158)  aspirin  EC tablet 81 mg (81 mg Oral Given 03/09/24 1913)  potassium chloride  SA (KLOR-CON  M) CR tablet 40 mEq (40 mEq Oral Given 03/09/24 1309)  potassium chloride  SA (KLOR-CON  M) CR tablet 40 mEq (40 mEq Oral Given 03/09/24 1916)    Mobility walks     Focused Assessments Cardiac Assessment Handoff:  Cardiac Rhythm: Normal sinus rhythm Lab Results  Component Value Date   TROPONINI <0.03 11/17/2018   Lab Results  Component Value Date   DDIMER 0.39 03/09/2024   Does the Patient currently have chest pain? 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 Has Denied SOB.

## 2024-03-10 NOTE — Progress Notes (Signed)
   03/10/24 0905  TOC Brief Assessment  Insurance and Status Reviewed  Patient has primary care physician Yes  Home environment has been reviewed Resides in an apartment  Prior level of function: Independent with ADLs  Prior/Current Home Services No current home services  Social Drivers of Health Review SDOH reviewed no interventions necessary  Readmission risk has been reviewed Yes  Transition of care needs no transition of care needs at this time

## 2024-03-10 NOTE — ED Notes (Signed)
 Save lt green and lav tube sent with morning labs

## 2024-03-10 NOTE — Plan of Care (Signed)
  Problem: Education: Goal: Understanding of cardiac disease, CV risk reduction, and recovery process will improve 03/10/2024 1658 by Terance Leonor BRAVO, RN Outcome: Progressing 03/10/2024 1244 by Terance Leonor BRAVO, RN Outcome: Progressing Goal: Individualized Educational Video(s) 03/10/2024 1658 by Terance Leonor BRAVO, RN Outcome: Progressing 03/10/2024 1244 by Terance Leonor BRAVO, RN Outcome: Progressing   Problem: Activity: Goal: Ability to tolerate increased activity will improve 03/10/2024 1658 by Terance Leonor BRAVO, RN Outcome: Progressing 03/10/2024 1244 by Terance Leonor BRAVO, RN Outcome: Progressing   Problem: Cardiac: Goal: Ability to achieve and maintain adequate cardiovascular perfusion will improve 03/10/2024 1658 by Terance Leonor BRAVO, RN Outcome: Progressing 03/10/2024 1244 by Terance Leonor BRAVO, RN Outcome: Progressing   Problem: Health Behavior/Discharge Planning: Goal: Ability to safely manage health-related needs after discharge will improve 03/10/2024 1658 by Terance Leonor BRAVO, RN Outcome: Progressing 03/10/2024 1244 by Terance Leonor BRAVO, RN Outcome: Progressing   Problem: Education: Goal: Knowledge of General Education information will improve Description: Including pain rating scale, medication(s)/side effects and non-pharmacologic comfort measures 03/10/2024 1658 by Terance Leonor BRAVO, RN Outcome: Progressing 03/10/2024 1244 by Terance Leonor BRAVO, RN Outcome: Progressing   Problem: Health Behavior/Discharge Planning: Goal: Ability to manage health-related needs will improve 03/10/2024 1658 by Terance Leonor BRAVO, RN Outcome: Progressing 03/10/2024 1244 by Terance Leonor BRAVO, RN Outcome: Progressing   Problem: Clinical Measurements: Goal: Ability to maintain clinical measurements within normal limits will improve 03/10/2024 1658 by Terance Leonor BRAVO, RN Outcome: Progressing 03/10/2024 1244 by Terance Leonor BRAVO, RN Outcome: Progressing Goal:  Will remain free from infection 03/10/2024 1658 by Terance Leonor BRAVO, RN Outcome: Progressing 03/10/2024 1244 by Terance Leonor BRAVO, RN Outcome: Progressing Goal: Diagnostic test results will improve 03/10/2024 1658 by Terance Leonor BRAVO, RN Outcome: Progressing 03/10/2024 1244 by Terance Leonor BRAVO, RN Outcome: Progressing Goal: Respiratory complications will improve 03/10/2024 1658 by Terance Leonor BRAVO, RN Outcome: Progressing 03/10/2024 1244 by Terance Leonor BRAVO, RN Outcome: Progressing Goal: Cardiovascular complication will be avoided 03/10/2024 1658 by Terance Leonor BRAVO, RN Outcome: Progressing 03/10/2024 1244 by Terance Leonor BRAVO, RN Outcome: Progressing   Problem: Activity: Goal: Risk for activity intolerance will decrease 03/10/2024 1658 by Terance Leonor BRAVO, RN Outcome: Progressing 03/10/2024 1244 by Terance Leonor BRAVO, RN Outcome: Progressing   Problem: Nutrition: Goal: Adequate nutrition will be maintained 03/10/2024 1658 by Terance Leonor BRAVO, RN Outcome: Progressing 03/10/2024 1244 by Terance Leonor BRAVO, RN Outcome: Progressing   Problem: Coping: Goal: Level of anxiety will decrease 03/10/2024 1658 by Terance Leonor BRAVO, RN Outcome: Progressing 03/10/2024 1244 by Terance Leonor BRAVO, RN Outcome: Progressing   Problem: Elimination: Goal: Will not experience complications related to bowel motility 03/10/2024 1658 by Terance Leonor BRAVO, RN Outcome: Progressing 03/10/2024 1244 by Terance Leonor BRAVO, RN Outcome: Progressing Goal: Will not experience complications related to urinary retention 03/10/2024 1658 by Terance Leonor BRAVO, RN Outcome: Progressing 03/10/2024 1244 by Terance Leonor BRAVO, RN Outcome: Progressing   Problem: Pain Managment: Goal: General experience of comfort will improve and/or be controlled 03/10/2024 1658 by Terance Leonor BRAVO, RN Outcome: Progressing 03/10/2024 1244 by Terance Leonor BRAVO, RN Outcome: Progressing    Problem: Safety: Goal: Ability to remain free from injury will improve 03/10/2024 1658 by Terance Leonor BRAVO, RN Outcome: Progressing 03/10/2024 1244 by Terance Leonor BRAVO, RN Outcome: Progressing   Problem: Skin Integrity: Goal: Risk for impaired skin integrity will decrease 03/10/2024 1658 by Terance Leonor BRAVO, RN Outcome: Progressing 03/10/2024 1244 by Terance Leonor BRAVO, RN Outcome: Progressing

## 2024-03-23 ENCOUNTER — Telehealth (HOSPITAL_COMMUNITY): Payer: Self-pay | Admitting: Emergency Medicine

## 2024-03-23 NOTE — Telephone Encounter (Signed)
 Reaching out to patient to offer assistance regarding upcoming cardiac imaging study; pt verbalizes understanding of appt date/time, parking situation and where to check in, pre-test NPO status and medications ordered, and verified current allergies; name and call back number provided for further questions should they arise Camie Shutter RN Navigator Cardiac Imaging [facility] [phone] office [phone] cell  Holding diuretic and entresto , taking metop 2 hr prior to scan

## 2024-03-23 NOTE — Telephone Encounter (Signed)
 Attempted to call patient regarding upcoming cardiac CT appointment. Left message on voicemail with name and callback number Rockwell Alexandria RN Navigator Cardiac Imaging [facility] [phone] Office [phone] Cell

## 2024-03-24 ENCOUNTER — Encounter: Payer: Self-pay | Admitting: Podiatry

## 2024-03-24 ENCOUNTER — Ambulatory Visit (INDEPENDENT_AMBULATORY_CARE_PROVIDER_SITE_OTHER): Admitting: Podiatry

## 2024-03-24 ENCOUNTER — Ambulatory Visit (HOSPITAL_COMMUNITY)
Admission: RE | Admit: 2024-03-24 | Discharge: 2024-03-24 | Disposition: A | Discharge status: Home / Self Care | Source: Ambulatory Visit | Attending: Cardiology | Admitting: Cardiology

## 2024-03-24 ENCOUNTER — Telehealth: Payer: Self-pay | Admitting: *Deleted

## 2024-03-24 ENCOUNTER — Ambulatory Visit

## 2024-03-24 DIAGNOSIS — R079 Chest pain, unspecified: Secondary | ICD-10-CM | POA: Diagnosis present

## 2024-03-24 DIAGNOSIS — M7741 Metatarsalgia, right foot: Secondary | ICD-10-CM | POA: Diagnosis not present

## 2024-03-24 DIAGNOSIS — M2021 Hallux rigidus, right foot: Secondary | ICD-10-CM

## 2024-03-24 MED ORDER — IOHEXOL 350 MG/ML SOLN
100.0000 mL | Freq: Once | INTRAVENOUS | Status: AC | PRN
  Administered 2024-03-24: 100 mL via INTRAVENOUS

## 2024-03-24 MED ORDER — NITROGLYCERIN 0.4 MG SL SUBL
0.8000 mg | SUBLINGUAL_TABLET | Freq: Once | SUBLINGUAL | Status: AC
  Administered 2024-03-24: 0.8 mg via SUBLINGUAL

## 2024-03-24 NOTE — Progress Notes (Signed)
  Subjective:  Patient ID: Tina Colon, female    DOB: 1980-09-17,   MRN: [id number]  Chief Complaint  Patient presents with   Toe Pain    I'm having pain at the bottom of my big toe on my right foot.  It burns.  I get numbness in my second toe.    43 y.o. female presents for concern of pain at the bottom of her right great toe. She relates burning and numbnes in the second toe as well. Does have a history of ETOH abuse . Denies any other pedal complaints. Denies n/v/f/c.   Past Medical History:  Diagnosis Date   Cardiomyopathy (HCC)    CHF (congestive heart failure) (HCC)    ETOH abuse    Gastritis    GERD (gastroesophageal reflux disease)    Heart failure (HCC)    Pancreatitis     Objective:  Physical Exam: Vascular: DP/PT pulses 2/4 bilateral. CFT <3 seconds. Normal hair growth on digits. No edema.  Skin. No lacerations or abrasions bilateral feet.  Musculoskeletal: MMT 5/5 bilateral lower extremities in DF, PF, Inversion and Eversion. Deceased ROM in DF of ankle joint. Tender over dorsum of right hallux and in first interspace. Pain with metatarsal squeeze. Negative mulder click. No pain to palpation of second digit.  Neurological: Sensation intact to light touch.   Assessment:   1. Metatarsalgia of right foot   2. Hallux rigidus of right foot      Plan:  Patient was evaluated and treated and all questions answered. Discussed neuroma vs hallux rigidus vs metatarsalgia and treatment options with patient.  Radiographs reviewed and discussed with patient. No acute fractures or dislocations noted. Metadductus noted.  Injection offered today. Patient deferred.  Discussed padding and offloading today.  Unable to do NSAIDS due to CHF.  Discussed if pain does not improve may consider  MRI for further surgical planning.  Patient to return in 6 weeks or sooner if concerns arise.     Asberry Failing, DPM

## 2024-03-24 NOTE — Telephone Encounter (Signed)
 I called and asked the patient to come in a few minutes early for her appointment because we need xrays of her foot.

## 2024-03-26 ENCOUNTER — Ambulatory Visit: Payer: Self-pay | Admitting: Student

## 2024-03-27 NOTE — Progress Notes (Unsigned)
 Cardiology Office Note:    Date:  03/28/2024   ID:  Tina Colon, DOB 07-Mar-1981, MRN [id number]  PCP:  Alec House, MD   [facility] Providers Cardiologist:  Newman JINNY Lawrence, MD { Click to update primary MD,subspecialty MD or APP then REFRESH:1}    Referring MD: Alec House, MD   Chief complaint: Hospital follow up     History of Present Illness:   Tina Colon is a 43 y.o. female with a hx of NICM, QTc prolongation, HFimpEF, marijuana abuse, alcohol abuse, pancreatitis, family history of premature CAD, presenting to office today for follow-up of recent hospital admission for chest pain.  12/26/2017 echo showed LVEF 15-20%, GDMT started.  Coronary CTA ordered, performed 03/2018 with normal coronary arteries and no evidence of CAD.  Subsequent echo 07/2018 EF improved to 50%.  On 09/2019 the patient was hospitalized for shortness of breath, abdominal pain, nausea, and vomiting.  Troponins were greater than 2100. An echo was done and showed a reduced LVEF of 15 to 20%.  A left and right heart cath was done and showed normal coronary arteries.  It was suspected that the symptoms were likely secondary to alcohol abuse.  The patient was discharged home on GDMT.  A repeat echo was done on 11/2019 and showed an improved LVEF of 50 to 55%.   An echo was done on 10/2022 and showed a moderately reduced LVEF of 45 to 50%.  Because of this the patient was restarted on Farxiga .  Follow-up echo on 03/2023 showed a normal LVEF 50 to 55%, no RWMA, normal RV function, and grossly normal valve function.  Office visit with Dr. Lawrence on 09/2023 the patient was stable and was maintained on Entresto , Aldactone , metoprolol , and Ivabradine .  Patient was counseled on alcohol sensation.   Presented to ED on 03/09/2024 for chest pain and exertional dyspnea X 3 days. Labs showed hypokalemia with a potassium of 3.2, magnesium  of 2.0, creatinine of 0.64, normal LFTs within the reference ranges,  negative proBNP of less than 50, negative troponins of less than 15 x 2, negative D-dimer 0.39, normal hemoglobin of 12.7, normal leukocytes of 6.0, respiratory panel was negative, and pregnancy test was negative.  Negative CXR.  EKG: Sinus rhythm, 98 bpm.  Patient admitted with cardiology consultation, telemetry 70s-80s NSR, occasional sinus arrhythmia.   Echo 03/10/2024: LVEF 50-55%, LV low normal function, no RWMA, G1 DD, normal RV, trivial MV regurg, no AV regurg, RA 3 mmHg.  Considering negative troponins, EKG negative for ischemia, TTE stable from prior, no concern for ACS at that time, coronary CTA ordered outpatient and recommended outpatient cardiology follow-up.  Coronary CTA 03/24/2024: Calcium  score 25.8 (98th percentile), right dominant, CAD-RADS 1 minimal nonobstructive CAD.  Consider nonatherosclerotic causes of chest pain, with preventative therapy and risk factor modification.  Patient presents alone to clinic today, appears to be improved from a cardiovascular standpoint. She denies chest pain, palpitations, dyspnea, orthopnea, n, v, dark/tarry/bloody stools, hematuria, dizziness, syncope, edema, weight gain.  Reports she was going through a bit of stress in her home/work life when she presented to the ED initially that has since resolved.  No further symptoms of chest pain since recent ED visit.  Does report occasional fatigue.  Also states she has noticed a mild increase in her baseline heart rate over the last month or two, has noticed her resting BP mildly decreased as well.  ROS:   Please see the history of present illness.  All other systems reviewed and are negative.     Past Medical History:  Diagnosis Date   Cardiomyopathy [facility])    CHF (congestive heart failure) (HCC)    ETOH abuse    Gastritis    GERD (gastroesophageal reflux disease)    Heart failure (HCC)    Pancreatitis     Past Surgical History:  Procedure Laterality Date   ESOPHAGOGASTRODUODENOSCOPY   06/19/2015    Dr Shila   RIGHT/LEFT HEART CATH AND CORONARY ANGIOGRAPHY N/A 09/26/2019   Procedure: RIGHT/LEFT HEART CATH AND CORONARY ANGIOGRAPHY;  Surgeon: Elmira Newman PARAS, MD;  Location: [facility] INVASIVE CV LAB;  Service: Cardiovascular;  Laterality: N/A;   WISDOM TOOTH EXTRACTION      Current Medications: Current Meds  Medication Sig   MAGnesium -Oxide 400 (240 Mg) MG tablet Take 1 tablet (400 mg total) by mouth 2 (two) times daily. (Patient taking differently: Take 400 mg by mouth in the morning.)   metoprolol  succinate (TOPROL -XL) 50 MG 24 hr tablet Take 1 tablet (50 mg total) by mouth daily. Take with or immediately following a meal.   pantoprazole  (PROTONIX ) 40 MG tablet Take 1 tablet (40 mg total) by mouth daily.   sacubitril -valsartan  (ENTRESTO ) 97-103 MG Take 1 tablet by mouth 2 (two) times daily.   spironolactone  (ALDACTONE ) 25 MG tablet Take 1 tablet (25 mg total) by mouth daily. (Patient taking differently: Take 12.5 mg by mouth daily.)   TRI-LO-MILI  0.18/0.215/0.25 MG-25 MCG tab Take 1 tablet by mouth in the morning.     Allergies:   Penicillins   Social History   Socioeconomic History   Marital status: Married    Spouse name: Not on file   Number of children: 0   Years of education: Not on file   Highest education level: Not on file  Occupational History   Occupation: retail  Tobacco Use   Smoking status: Former    Current packs/day: 0.00    Average packs/day: 0.5 packs/day for 10.0 years (5.0 ttl pk-yrs)    Types: Cigarettes    Start date: 2001    Quit date: 2011    Years since quitting: 14.8   Smokeless tobacco: Never  Vaping Use   Vaping status: Never Used  Substance and Sexual Activity   Alcohol use: Yes    Comment: occ   Drug use: Not Currently    Types: Marijuana    Comment: last smoked 09/20/2019   Sexual activity: Not Currently  Other Topics Concern   Not on file  Social History Narrative   Not on file   Social Drivers of Health    Financial Resource Strain: Not on file  Food Insecurity: No Food Insecurity (03/10/2024)   Hunger Vital Sign    Worried About Running Out of Food in the Last Year: Never true    Ran Out of Food in the Last Year: Never true  Transportation Needs: No Transportation Needs (03/10/2024)   PRAPARE - Administrator, Civil Service (Medical): No    Lack of Transportation (Non-Medical): No  Physical Activity: Not on file  Stress: Not on file  Social Connections: Not on file     Family History: The patient's family history includes Colon polyps in her sister; Diabetes in her mother and another family member; Gallstones in her paternal aunt and sister; Gout in her father; Heart disease in an other family member; Hyperlipidemia in her mother; Hypertension in her father; Irritable bowel syndrome in an other family member; Stomach cancer in  her maternal grandfather.  EKGs/Labs/Other Studies Reviewed:    The following studies were reviewed today:  EKG Interpretation Date/Time:  Monday March 28 2024 14:11:15 EDT Ventricular Rate:  83 PR Interval:  146 QRS Duration:  88 QT Interval:  390 QTC Calculation: 458 R Axis:   -23  Text Interpretation: Normal sinus rhythm Possible Anterior infarct , age undetermined No significant change since last tracing Confirmed by Elaine Moloney [phone]) on 03/28/2024 2:35:44 PM    Recent Labs: 09/19/2023: B Natriuretic Peptide 29.8 03/09/2024: ALT 14; Pro Brain Natriuretic Peptide <50.0 03/10/2024: BUN 8; Creatinine, Ser 0.65; Hemoglobin 12.4; Magnesium  2.2; Platelets 236; Potassium 4.1; Sodium 138  Recent Lipid Panel    Component Value Date/Time   CHOL 147 09/24/2019 0919   CHOL 144 10/02/2017 1031   TRIG 130 09/24/2019 0919   HDL 61 09/24/2019 0919   HDL 56 10/02/2017 1031   CHOLHDL 2.4 09/24/2019 0919   VLDL 26 09/24/2019 0919   LDLCALC 60 09/24/2019 0919   LDLCALC 64 10/02/2017 1031        Physical Exam:    VS:  BP 102/72 (BP  Location: Left Arm, Patient Position: Sitting, Cuff Size: Normal)   Pulse 83   Ht 5' (1.524 m)   Wt 186 lb 9.6 oz (84.6 kg)   LMP 03/21/2024 (Exact Date)   SpO2 97%   BMI 36.44 kg/m        Wt Readings from Last 3 Encounters:  03/28/24 186 lb 9.6 oz (84.6 kg)  03/09/24 185 lb (83.9 kg)  09/14/23 190 lb (86.2 kg)     GEN:  Well nourished, well developed in no acute distress HEENT: Normal NECK: No carotid bruits CARDIAC:  S1-S2 normal, RRR, no murmurs, rubs, gallops RESPIRATORY:  Clear to auscultation without rales, wheezing or rhonchi  MUSCULOSKELETAL:  No edema; No deformity  SKIN: Warm and dry NEUROLOGIC:  Alert and oriented x 3 PSYCHIATRIC:  Normal affect       Assessment & Plan Chronic heart failure with preserved ejection fraction (HFpEF) (HCC) Other fatigue NICM (nonischemic cardiomyopathy) (HCC) Echo 03/10/2024: LVEF 50-55%, LV low normal function, no RWMA, G1 DD, normal RV, trivial MV regurg, no AV regurg, RA 3 mmHg. Historical EF 15-20% when abusing alcohol Not currently using alcohol, went to the gym yesterday and felt fine Reports occasional fatigue, has noticed this correlates with lower blood pressure and mild increase in baseline heart rate We will titrate GDMT with reducing spironolactone  to see if this helps her symptoms Decrease spironolactone  to 12.5 mg daily Continue Farxiga  10 mg daily Continue Toprol -XL 50 mg daily Continue sacubitril /valsartan  97-103 mg daily Coronary artery calcification of native artery Coronary CTA 03/24/2024: Calcium  score 25.8 (98th percentile), right dominant, CAD-RADS 1 minimal nonobstructive CAD.  Consider nonatherosclerotic causes of chest pain, with preventative therapy and risk factor modification. Denies CP, SOB, palpitations, near-syncope Will order fasting lipids, LDL goal <70,  If LDL not at goal, discussed a trial of atorvastatin  with patient given LFTs normal at recent ED visit. Patient hesitant to trial statin given  family history of statin intolerance d/t muscle aches, unsure of which statin her mother was intolerant to. But she is willing to trial them. Would likely start on atorvastatin  20 mg daily if LDL not <70 going forward. ASA ***  Current follow-up plan for 6 months, possibly sooner to discuss cholesterol management options if we find she is intolerant to statins going forward       Medication Adjustments/Labs and Tests Ordered: Current  medicines are reviewed at length with the patient today.  Concerns regarding medicines are outlined above.  Orders Placed This Encounter  Procedures   Lipid panel   EKG 12-Lead   No orders of the defined types were placed in this encounter.   Patient Instructions  Medication Instructions:  Decrease Spironolactone  12.5 mg daily  *If you need a refill on your cardiac medications before your next appointment, please call your pharmacy*  Lab Work: Fasting Lipid panel   Testing/Procedures: NONE ordered at this time of appointment   Follow-Up: At [facility], you and your health needs are our priority.  As part of our continuing mission to provide you with exceptional heart care, our providers are all part of one team.  This team includes your primary Cardiologist (physician) and Advanced Practice Providers or APPs (Physician Assistants and Nurse Practitioners) who all work together to provide you with the care you need, when you need it.  Your next appointment:   6 month(s)  Provider:   Newman JINNY Lawrence, MD    We recommend signing up for the patient portal called MyChart.  Sign up information is provided on this After Visit Summary.  MyChart is used to connect with patients for Virtual Visits (Telemedicine).  Patients are able to view lab/test results, encounter notes, upcoming appointments, etc.  Non-urgent messages can be sent to your provider as well.   To learn more about what you can do with MyChart, go to [email or web address].    Other Instructions            Signed, Miriam FORBES Shams, NP  03/28/2024 3:35 PM    [facility]

## 2024-03-28 ENCOUNTER — Ambulatory Visit: Attending: Nurse Practitioner | Admitting: Emergency Medicine

## 2024-03-28 ENCOUNTER — Encounter: Payer: Self-pay | Admitting: Nurse Practitioner

## 2024-03-28 VITALS — BP 102/72 | HR 83 | Ht 60.0 in | Wt 186.6 lb

## 2024-03-28 DIAGNOSIS — I2510 Atherosclerotic heart disease of native coronary artery without angina pectoris: Secondary | ICD-10-CM | POA: Diagnosis not present

## 2024-03-28 DIAGNOSIS — I428 Other cardiomyopathies: Secondary | ICD-10-CM

## 2024-03-28 DIAGNOSIS — I5032 Chronic diastolic (congestive) heart failure: Secondary | ICD-10-CM | POA: Diagnosis not present

## 2024-03-28 DIAGNOSIS — R5383 Other fatigue: Secondary | ICD-10-CM | POA: Diagnosis not present

## 2024-03-28 DIAGNOSIS — I2584 Coronary atherosclerosis due to calcified coronary lesion: Secondary | ICD-10-CM

## 2024-03-28 NOTE — Assessment & Plan Note (Addendum)
 Coronary CTA 03/24/2024: Calcium  score 25.8 (98th percentile), right dominant, CAD-RADS 1 minimal nonobstructive CAD.  Consider nonatherosclerotic causes of chest pain, with preventative therapy and risk factor modification. Denies CP, SOB, palpitations, near-syncope Will order fasting lipids, LDL goal <70,  If LDL not at goal, discussed a trial of atorvastatin  with patient given LFTs normal at recent ED visit. Patient hesitant to trial statin given family history of statin intolerance d/t muscle aches, unsure of which statin her mother was intolerant to. But she is willing to trial them. Would likely start on atorvastatin  20 mg daily if LDL not <70 going forward. ASA ***

## 2024-03-28 NOTE — Patient Instructions (Signed)
 Medication Instructions:  Decrease Spironolactone  12.5 mg daily  *If you need a refill on your cardiac medications before your next appointment, please call your pharmacy*  Lab Work: Fasting Lipid panel   Testing/Procedures: NONE ordered at this time of appointment   Follow-Up: At [facility], you and your health needs are our priority.  As part of our continuing mission to provide you with exceptional heart care, our providers are all part of one team.  This team includes your primary Cardiologist (physician) and Advanced Practice Providers or APPs (Physician Assistants and Nurse Practitioners) who all work together to provide you with the care you need, when you need it.  Your next appointment:   6 month(s)  Provider:   Newman JINNY Lawrence, MD    We recommend signing up for the patient portal called MyChart.  Sign up information is provided on this After Visit Summary.  MyChart is used to connect with patients for Virtual Visits (Telemedicine).  Patients are able to view lab/test results, encounter notes, upcoming appointments, etc.  Non-urgent messages can be sent to your provider as well.   To learn more about what you can do with MyChart, go to [email or web address].   Other Instructions

## 2024-03-29 ENCOUNTER — Ambulatory Visit: Payer: Self-pay | Admitting: Emergency Medicine

## 2024-03-29 DIAGNOSIS — Z79899 Other long term (current) drug therapy: Secondary | ICD-10-CM

## 2024-03-29 DIAGNOSIS — E789 Disorder of lipoprotein metabolism, unspecified: Secondary | ICD-10-CM

## 2024-03-29 LAB — LIPID PANEL
Chol/HDL Ratio: 3.3 ratio (ref 0.0–4.4)
Cholesterol, Total: 181 mg/dL (ref 100–199)
HDL: 55 mg/dL (ref >39)
LDL Chol Calc (NIH): 99 mg/dL (ref 0–99)
Triglycerides: 153 mg/dL — ABNORMAL HIGH (ref 0–149)
VLDL Cholesterol Cal: 27 mg/dL (ref 5–40)

## 2024-03-30 ENCOUNTER — Telehealth: Payer: Self-pay

## 2024-03-30 MED ORDER — ROSUVASTATIN CALCIUM 10 MG PO TABS: 10.0000 mg | ORAL_TABLET | Freq: Every day | ORAL | 2 refills | Status: DC

## 2024-03-30 NOTE — Telephone Encounter (Signed)
 Patient notified of most recent lab results, medication sent to the patient's pharmacy, and lab orders have been placed.

## 2024-04-05 ENCOUNTER — Other Ambulatory Visit: Payer: Self-pay | Admitting: Gastroenterology

## 2024-04-05 DIAGNOSIS — K219 Gastro-esophageal reflux disease without esophagitis: Secondary | ICD-10-CM

## 2024-05-12 ENCOUNTER — Encounter: Payer: Self-pay | Admitting: Podiatry

## 2024-05-12 ENCOUNTER — Ambulatory Visit: Admitting: Podiatry

## 2024-05-12 DIAGNOSIS — M2021 Hallux rigidus, right foot: Secondary | ICD-10-CM | POA: Diagnosis not present

## 2024-05-12 DIAGNOSIS — M7741 Metatarsalgia, right foot: Secondary | ICD-10-CM

## 2024-05-12 MED ORDER — DEXAMETHASONE SODIUM PHOSPHATE 120 MG/30ML IJ SOLN
4.0000 mg | Freq: Once | INTRAMUSCULAR | Status: AC
  Administered 2024-05-12: 4 mg via INTRA_ARTICULAR

## 2024-05-12 MED ORDER — TRIAMCINOLONE ACETONIDE 10 MG/ML IJ SUSP
2.5000 mg | Freq: Once | INTRAMUSCULAR | Status: AC
  Administered 2024-05-12: 2.5 mg via INTRA_ARTICULAR

## 2024-05-12 NOTE — Progress Notes (Signed)
  Subjective:  Patient ID: Tina Colon, female    DOB: 1981-01-10,   MRN: [id number]  Chief Complaint  Patient presents with   Neuroma    I'm still in pain.    43 y.o. female presents for follow-up of right great toe and foot pain.  She relates still a lot of pain.  She relates the padding did not help and has been only hurting her back more.  Does have a history of ETOH abuse . Denies any other pedal complaints. Denies n/v/f/c.   Past Medical History:  Diagnosis Date   Cardiomyopathy (HCC)    CHF (congestive heart failure) (HCC)    ETOH abuse    Gastritis    GERD (gastroesophageal reflux disease)    Heart failure (HCC)    Pancreatitis     Objective:  Physical Exam: Vascular: DP/PT pulses 2/4 bilateral. CFT <3 seconds. Normal hair growth on digits. No edema.  Skin. No lacerations or abrasions bilateral feet.  Musculoskeletal: MMT 5/5 bilateral lower extremities in DF, PF, Inversion and Eversion. Deceased ROM in DF of ankle joint. Tender over dorsum of right hallux and in first interspace. Pain with metatarsal squeeze. Negative mulder click. No pain to palpation of second digit.  Neurological: Sensation intact to light touch.   Assessment:   1. Hallux rigidus of right foot   2. Metatarsalgia of right foot       Plan:  Patient was evaluated and treated and all questions answered. Discussed neuroma vs hallux rigidus vs metatarsalgia and treatment options with patient.  Radiographs reviewed and discussed with patient. No acute fractures or dislocations noted. Metadductus noted.  Injection offered today. Procedure below.l  Unable to do NSAIDS due to CHF.  Discussed if pain does not improve may consider  MRI for further surgical planning.  Patient to return in 6 weeks or sooner if concerns arise.   Procedure: Injection Tendon/Ligament Discussed alternatives, risks, complications and verbal consent was obtained.  Location: Right first metatarsophalangeal joint . Skin  Prep: Alcohol. Injectate: 1cc 0.5% marcaine plain, 1 cc dexamethasone 0.5 cc kenalog  Disposition: Patient tolerated procedure well. Injection site dressed with a band-aid.  Post-injection care was discussed and return precautions discussed.      Asberry Failing, DPM

## 2024-05-26 ENCOUNTER — Telehealth: Payer: Self-pay | Admitting: Podiatry

## 2024-05-26 NOTE — Telephone Encounter (Signed)
 Patient called in requesting a letter for her job specifying what type of shoe she can wear at work. Patient says that the only shoes that are comfortable to wear are her Crocs.

## 2024-05-30 NOTE — Telephone Encounter (Signed)
Spoke to patient aware

## 2024-06-23 ENCOUNTER — Ambulatory Visit: Admitting: Podiatry

## 2024-06-28 ENCOUNTER — Other Ambulatory Visit: Payer: Self-pay | Admitting: Gastroenterology

## 2024-06-28 DIAGNOSIS — K219 Gastro-esophageal reflux disease without esophagitis: Secondary | ICD-10-CM

## 2024-07-02 ENCOUNTER — Other Ambulatory Visit: Payer: Self-pay | Admitting: Emergency Medicine

## 2024-07-08 ENCOUNTER — Telehealth: Payer: Self-pay | Admitting: Gastroenterology

## 2024-07-08 DIAGNOSIS — K219 Gastro-esophageal reflux disease without esophagitis: Secondary | ICD-10-CM

## 2024-07-08 NOTE — Telephone Encounter (Signed)
 Patient called requesting a refill for pantoprazole . Patient is scheduled for 2/16. Please advise, thank you

## 2024-07-11 MED ORDER — PANTOPRAZOLE SODIUM 40 MG PO TBEC: 40.0000 mg | DELAYED_RELEASE_TABLET | Freq: Every day | ORAL | 0 refills | Status: AC

## 2024-07-11 NOTE — Telephone Encounter (Signed)
 Pt sent my chart message below: need my protonix  refilled if you could make sure that information was sent to the pharmacy it would be most appreciated because Iam out of my medication and Iam feeling it and it does not feel good.----- Message -----

## 2024-07-25 ENCOUNTER — Ambulatory Visit: Admitting: Gastroenterology

## 2024-07-28 ENCOUNTER — Ambulatory Visit: Admitting: Podiatry

## 2024-09-20 ENCOUNTER — Ambulatory Visit: Admitting: Cardiology
